# Patient Record
Sex: Female | Born: 1985 | Race: White | Hispanic: No | Smoking: Never smoker
Health system: Southern US, Community
[De-identification: ages and names within clinical notes are randomized; demographics above are authoritative.]

## PROBLEM LIST (undated history)

## (undated) ENCOUNTER — Inpatient Hospital Stay: Payer: Self-pay

## (undated) DIAGNOSIS — F909 Attention-deficit hyperactivity disorder, unspecified type: Secondary | ICD-10-CM

## (undated) DIAGNOSIS — N289 Disorder of kidney and ureter, unspecified: Secondary | ICD-10-CM

## (undated) DIAGNOSIS — Z9889 Other specified postprocedural states: Secondary | ICD-10-CM

## (undated) DIAGNOSIS — G43909 Migraine, unspecified, not intractable, without status migrainosus: Secondary | ICD-10-CM

## (undated) DIAGNOSIS — R112 Nausea with vomiting, unspecified: Secondary | ICD-10-CM

## (undated) DIAGNOSIS — R12 Heartburn: Secondary | ICD-10-CM

## (undated) DIAGNOSIS — F419 Anxiety disorder, unspecified: Secondary | ICD-10-CM

## (undated) HISTORY — PX: WISDOM TOOTH EXTRACTION: SHX21

## (undated) HISTORY — DX: Anxiety disorder, unspecified: F41.9

## (undated) HISTORY — DX: Heartburn: R12

## (undated) HISTORY — DX: Migraine, unspecified, not intractable, without status migrainosus: G43.909

## (undated) HISTORY — DX: Attention-deficit hyperactivity disorder, unspecified type: F90.9

---
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
## 2005-01-02 ENCOUNTER — Emergency Department (HOSPITAL_COMMUNITY): Admission: EM | Admit: 2005-01-02 | Discharge: 2005-01-02 | Payer: Self-pay | Admitting: Dietician

## 2005-03-08 ENCOUNTER — Emergency Department (HOSPITAL_COMMUNITY): Admission: EM | Admit: 2005-03-08 | Discharge: 2005-03-09 | Payer: Self-pay | Admitting: Emergency Medicine

## 2005-04-08 ENCOUNTER — Other Ambulatory Visit: Admission: RE | Admit: 2005-04-08 | Discharge: 2005-04-08 | Payer: Self-pay | Admitting: Obstetrics & Gynecology

## 2005-05-22 ENCOUNTER — Encounter: Admission: RE | Admit: 2005-05-22 | Discharge: 2005-06-12 | Payer: Self-pay | Admitting: Occupational Medicine

## 2005-05-27 ENCOUNTER — Emergency Department (HOSPITAL_COMMUNITY): Admission: EM | Admit: 2005-05-27 | Discharge: 2005-05-27 | Payer: Self-pay | Admitting: Family Medicine

## 2005-06-08 ENCOUNTER — Emergency Department (HOSPITAL_COMMUNITY): Admission: EM | Admit: 2005-06-08 | Discharge: 2005-06-08 | Payer: Self-pay | Admitting: Emergency Medicine

## 2005-06-09 ENCOUNTER — Encounter: Admission: RE | Admit: 2005-06-09 | Discharge: 2005-06-09 | Payer: Self-pay | Admitting: Occupational Medicine

## 2005-06-28 ENCOUNTER — Emergency Department (HOSPITAL_COMMUNITY): Admission: EM | Admit: 2005-06-28 | Discharge: 2005-06-28 | Payer: Self-pay | Admitting: Emergency Medicine

## 2005-08-09 ENCOUNTER — Emergency Department (HOSPITAL_COMMUNITY): Admission: EM | Admit: 2005-08-09 | Discharge: 2005-08-09 | Payer: Self-pay | Admitting: Emergency Medicine

## 2005-12-24 ENCOUNTER — Observation Stay (HOSPITAL_COMMUNITY): Admission: AD | Admit: 2005-12-24 | Discharge: 2005-12-26 | Payer: Self-pay | Admitting: Gynecology

## 2006-02-21 ENCOUNTER — Emergency Department (HOSPITAL_COMMUNITY): Admission: EM | Admit: 2006-02-21 | Discharge: 2006-02-21 | Payer: Self-pay | Admitting: Emergency Medicine

## 2006-03-24 ENCOUNTER — Encounter: Admission: RE | Admit: 2006-03-24 | Discharge: 2006-03-24 | Payer: Self-pay | Admitting: Gastroenterology

## 2007-03-23 ENCOUNTER — Emergency Department (HOSPITAL_COMMUNITY): Admission: EM | Admit: 2007-03-23 | Discharge: 2007-03-23 | Payer: Self-pay | Admitting: Emergency Medicine

## 2008-02-08 ENCOUNTER — Encounter: Admission: RE | Admit: 2008-02-08 | Discharge: 2008-02-08 | Payer: Self-pay | Admitting: Family Medicine

## 2010-01-03 ENCOUNTER — Emergency Department (HOSPITAL_COMMUNITY): Admission: EM | Admit: 2010-01-03 | Discharge: 2010-01-03 | Payer: Self-pay | Admitting: Emergency Medicine

## 2010-07-27 ENCOUNTER — Encounter: Payer: Self-pay | Admitting: Gastroenterology

## 2010-09-22 LAB — URINALYSIS, ROUTINE W REFLEX MICROSCOPIC
Bilirubin Urine: NEGATIVE
Glucose, UA: NEGATIVE mg/dL
Hgb urine dipstick: NEGATIVE
Nitrite: NEGATIVE
Protein, ur: NEGATIVE mg/dL
Specific Gravity, Urine: 1.023 (ref 1.005–1.030)
Urobilinogen, UA: 0.2 mg/dL (ref 0.0–1.0)
pH: 7 (ref 5.0–8.0)

## 2010-09-22 LAB — DIFFERENTIAL
Basophils Absolute: 0 10*3/uL (ref 0.0–0.1)
Basophils Relative: 0 % (ref 0–1)
Eosinophils Absolute: 0 10*3/uL (ref 0.0–0.7)
Eosinophils Relative: 0 % (ref 0–5)
Lymphocytes Relative: 8 % — ABNORMAL LOW (ref 12–46)
Lymphs Abs: 1.6 10*3/uL (ref 0.7–4.0)
Monocytes Absolute: 1.2 10*3/uL — ABNORMAL HIGH (ref 0.1–1.0)
Monocytes Relative: 6 % (ref 3–12)
Neutro Abs: 17.5 10*3/uL — ABNORMAL HIGH (ref 1.7–7.7)
Neutrophils Relative %: 86 % — ABNORMAL HIGH (ref 43–77)

## 2010-09-22 LAB — COMPREHENSIVE METABOLIC PANEL
ALT: 28 U/L (ref 0–35)
AST: 35 U/L (ref 0–37)
Albumin: 5 g/dL (ref 3.5–5.2)
Alkaline Phosphatase: 52 U/L (ref 39–117)
BUN: 14 mg/dL (ref 6–23)
CO2: 24 mEq/L (ref 19–32)
Calcium: 10 mg/dL (ref 8.4–10.5)
Chloride: 104 mEq/L (ref 96–112)
Creatinine, Ser: 0.82 mg/dL (ref 0.4–1.2)
GFR calc Af Amer: >60 mL/min (ref >60)
GFR calc non Af Amer: >60 mL/min (ref >60)
Glucose, Bld: 103 mg/dL — ABNORMAL HIGH (ref 70–99)
Potassium: 4.3 mEq/L (ref 3.5–5.1)
Sodium: 141 mEq/L (ref 135–145)
Total Bilirubin: 1.3 mg/dL — ABNORMAL HIGH (ref 0.3–1.2)
Total Protein: 9 g/dL — ABNORMAL HIGH (ref 6.0–8.3)

## 2010-09-22 LAB — WET PREP, GENITAL
Clue Cells Wet Prep HPF POC: NONE SEEN
Trich, Wet Prep: NONE SEEN
Yeast Wet Prep HPF POC: NONE SEEN

## 2010-09-22 LAB — CBC
HCT: 43.7 % (ref 36.0–46.0)
Hemoglobin: 14.7 g/dL (ref 12.0–15.0)
MCH: 28.3 pg (ref 26.0–34.0)
MCHC: 33.6 g/dL (ref 30.0–36.0)
MCV: 84.2 fL (ref 78.0–100.0)
Platelets: 217 10*3/uL (ref 150–400)
RBC: 5.19 MIL/uL — ABNORMAL HIGH (ref 3.87–5.11)
RDW: 13.4 % (ref 11.5–15.5)
WBC: 20.3 10*3/uL — ABNORMAL HIGH (ref 4.0–10.5)

## 2010-09-22 LAB — RPR: RPR Ser Ql: NONREACTIVE

## 2010-09-22 LAB — GC/CHLAMYDIA PROBE AMP, GENITAL
Chlamydia, DNA Probe: NEGATIVE
GC Probe Amp, Genital: NEGATIVE

## 2010-09-22 LAB — POCT PREGNANCY, URINE: Preg Test, Ur: NEGATIVE

## 2010-09-22 LAB — LIPASE, BLOOD: Lipase: 29 U/L (ref 11–59)

## 2010-12-13 ENCOUNTER — Inpatient Hospital Stay (INDEPENDENT_AMBULATORY_CARE_PROVIDER_SITE_OTHER)
Admission: RE | Admit: 2010-12-13 | Discharge: 2010-12-13 | Disposition: A | Discharge status: Home / Self Care | Payer: Worker's Compensation | Source: Ambulatory Visit | Attending: Emergency Medicine | Admitting: Emergency Medicine

## 2010-12-13 DIAGNOSIS — S335XXA Sprain of ligaments of lumbar spine, initial encounter: Secondary | ICD-10-CM

## 2011-05-26 LAB — HM PAP SMEAR: HM Pap smear: NORMAL

## 2011-09-23 ENCOUNTER — Encounter (HOSPITAL_BASED_OUTPATIENT_CLINIC_OR_DEPARTMENT_OTHER): Payer: Self-pay | Admitting: *Deleted

## 2011-09-23 ENCOUNTER — Emergency Department (HOSPITAL_BASED_OUTPATIENT_CLINIC_OR_DEPARTMENT_OTHER)
Admission: EM | Admit: 2011-09-23 | Discharge: 2011-09-23 | Disposition: A | Discharge status: Home / Self Care | Payer: Worker's Compensation | Attending: Emergency Medicine | Admitting: Emergency Medicine

## 2011-09-23 DIAGNOSIS — S39012A Strain of muscle, fascia and tendon of lower back, initial encounter: Secondary | ICD-10-CM

## 2011-09-23 DIAGNOSIS — S335XXA Sprain of ligaments of lumbar spine, initial encounter: Secondary | ICD-10-CM | POA: Insufficient documentation

## 2011-09-23 DIAGNOSIS — X500XXA Overexertion from strenuous movement or load, initial encounter: Secondary | ICD-10-CM | POA: Insufficient documentation

## 2011-09-23 DIAGNOSIS — S239XXA Sprain of unspecified parts of thorax, initial encounter: Secondary | ICD-10-CM | POA: Insufficient documentation

## 2011-09-23 LAB — URINALYSIS, ROUTINE W REFLEX MICROSCOPIC
Glucose, UA: NEGATIVE mg/dL
Hgb urine dipstick: NEGATIVE
Ketones, ur: 15 mg/dL — AB
Protein, ur: NEGATIVE mg/dL

## 2011-09-23 MED ORDER — IBUPROFEN 600 MG PO TABS: 600.0000 mg | ORAL_TABLET | Freq: Four times a day (QID) | ORAL | Status: AC | PRN

## 2011-09-23 MED ORDER — CYCLOBENZAPRINE HCL 10 MG PO TABS: 10.0000 mg | ORAL_TABLET | Freq: Every evening | ORAL | Status: AC | PRN

## 2011-09-23 MED ORDER — HYDROCODONE-ACETAMINOPHEN 5-325 MG PO TABS: 2.0000 | ORAL_TABLET | Freq: Four times a day (QID) | ORAL | Status: AC | PRN

## 2011-09-23 MED ORDER — IBUPROFEN 800 MG PO TABS
800.0000 mg | ORAL_TABLET | Freq: Once | ORAL | Status: AC
  Administered 2011-09-23: 800 mg via ORAL
  Filled 2011-09-23: qty 1

## 2011-09-23 NOTE — Discharge Instructions (Signed)
Back Pain, Adult Low back pain is very common. About 1 in 5 people have back pain.The cause of low back pain is rarely dangerous. The pain often gets better over time.About half of people with a sudden onset of back pain feel better in just 2 weeks. About 8 in 10 people feel better by 6 weeks.  CAUSES Some common causes of back pain include:  Strain of the muscles or ligaments supporting the spine.   Wear and tear (degeneration) of the spinal discs.   Arthritis.   Direct injury to the back.  DIAGNOSIS Most of the time, the direct cause of low back pain is not known.However, back pain can be treated effectively even when the exact cause of the pain is unknown.Answering your caregiver's questions about your overall health and symptoms is one of the most accurate ways to make sure the cause of your pain is not dangerous. If your caregiver needs more information, he or she may order lab work or imaging tests (X-rays or MRIs).However, even if imaging tests show changes in your back, this usually does not require surgery. HOME CARE INSTRUCTIONS For many people, back pain returns.Since low back pain is rarely dangerous, it is often a condition that people can learn to manageon their own.   Remain active. It is stressful on the back to sit or stand in one place. Do not sit, drive, or stand in one place for more than 30 minutes at a time. Take short walks on level surfaces as soon as pain allows.Try to increase the length of time you walk each day.   Do not stay in bed.Resting more than 1 or 2 days can delay your recovery.   Do not avoid exercise or work.Your body is made to move.It is not dangerous to be active, even though your back may hurt.Your back will likely heal faster if you return to being active before your pain is gone.   Pay attention to your body when you bend and lift. Many people have less discomfortwhen lifting if they bend their knees, keep the load close to their  bodies,and avoid twisting. Often, the most comfortable positions are those that put less stress on your recovering back.   Find a comfortable position to sleep. Use a firm mattress and lie on your side with your knees slightly bent. If you lie on your back, put a pillow under your knees.   Only take over-the-counter or prescription medicines as directed by your caregiver. Over-the-counter medicines to reduce pain and inflammation are often the most helpful.Your caregiver may prescribe muscle relaxant drugs.These medicines help dull your pain so you can more quickly return to your normal activities and healthy exercise.   Put ice on the injured area.   Put ice in a plastic bag.   Place a towel between your skin and the bag.   Leave the ice on for 15 to 20 minutes, 3 to 4 times a day for the first 2 to 3 days. After that, ice and heat may be alternated to reduce pain and spasms.   Ask your caregiver about trying back exercises and gentle massage. This may be of some benefit.   Avoid feeling anxious or stressed.Stress increases muscle tension and can worsen back pain.It is important to recognize when you are anxious or stressed and learn ways to manage it.Exercise is a great option.  SEEK MEDICAL CARE IF:  You have pain that is not relieved with rest or medicine.   You have   pain that does not improve in 1 week.   You have new symptoms.   You are generally not feeling well.  SEEK IMMEDIATE MEDICAL CARE IF:   You have pain that radiates from your back into your legs.   You develop new bowel or bladder control problems.   You have unusual weakness or numbness in your arms or legs.   You develop nausea or vomiting.   You develop abdominal pain.   You feel faint.  Document Released: 06/23/2005 Document Revised: 06/12/2011 Document Reviewed: 11/11/2010 [facility] Patient Information 2012 [place], [place].  Pain Medicine Instructions You have been given a prescription for pain  medicines. These medicines may affect your ability to think clearly. They may also affect your ability to perform physical activities. Take these medicines only as needed for pain. You do not need to take them if you are not having pain, unless directed by your caregiver. You can take less than the prescribed dose if you find a smaller amount of medicine controls the pain. It may not be possible to make all of your pain go away, but you should be comfortable enough to move, breathe, and take care of yourself. After you start taking pain medicines, while taking the medicines, and for 8 hours after stopping the medicines:  Do not drive.   Do not operate machinery.   Do not operate power tools.   Do not sign legal documents.   Do not supervise children by yourself.   Do not participate in activities that require climbing or being in high places.   Do not enter a body of water (lake, river, ocean, spa, swimming pool) without an adult nearby who can help you.  You may have been prescribed a pain medicine that contains acetaminophen (paracetamol). If so, take only the amount directed by your caregiver. Do not take any other acetaminophen while taking this medicine. An overdose of acetaminophen can result in severe liver damage. If you are taking other medicines, check the active ingredients for acetaminophen. Acetaminophen is found in hundreds of over-the-counter and prescription medicines. These include cold relief products, menstrual cramp relief medicines, fever-reducing medicines, acid indigestion relief products, and pain relief products. HOME CARE INSTRUCTIONS   Do not drink alcohol, take sleeping pills, or take other medicines until at least 8 hours after your last dose of pain medicine, or as directed by your caregiver.   Use a bulk stool softener if you become constipated from your pain medicines. Increasing your intake of fruits and vegetables will also help.   Write down the times when  you take your medicines. Look at the times before taking your next dose of medicine. It is easy to become confused while on pain medicines. Recording the times helps you to avoid an overdose.  SEEK MEDICAL CARE IF:  Your medicine is not helping the pain go away.   You vomit or have diarrhea shortly after taking the medicine.   You develop new pain in areas that did not hurt before.  SEEK IMMEDIATE MEDICAL CARE IF:  You feel dizzy or faint.   You feel there are other problems that might be caused by your medicine.  MAKE SURE YOU:   Understand these instructions.   Will watch your condition.   Will get help right away if you are not doing well or get worse.  Document Released: 09/29/2000 Document Revised: 06/12/2011 Document Reviewed: 06/07/2010 [facility] Patient Information 2012 [place], [place].

## 2011-09-23 NOTE — ED Provider Notes (Addendum)
History     CSN: [id number]  Arrival date & time 09/23/11  1946   First MD Initiated Contact with Patient 09/23/11 2131      Chief Complaint  Patient presents with  . 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 Denies numbness/tingling/weakness extremities. Pain 8/10 at this time. Took ibuprofen without relief. Denies history of recent trauma/falls. Denies h/o malignancy, DM, immunocompromise  injection drug use, immunosuppression, indwelling urinary catheter, prolonged steroid use, skin or urinary tract infection. No numbness/tingling/weakness of extremities. Denies fever/chills. Denies saddle anesthesia, no urinary incontinence or retention. Denies hematuria/dysuria/freq/urgency   ED Notes, ED Provider Notes from 09/23/11 0000 to 09/23/11 20:10:29       Merian Capron, RN 09/23/2011 20:09      Pt was trying to get a patient out of the bathtub onto the stretcher, c/o pain in lower back that "goes up" her spine area Pt took ibuprofen at 1330 (Pt works for [employer] and will call her supervisor to see if she needs a UDS)    History reviewed. No pertinent past medical history.  History reviewed. No pertinent past surgical history.  No family history on file.  History  Substance Use Topics  . Smoking status: Never Smoker   . Smokeless tobacco: Not on file  . Alcohol Use: Yes    OB History    Grav Para Term Preterm Abortions TAB SAB Ect Mult Living                  Review of Systems  All other systems reviewed and are negative.  except as noted HPI  Allergies  Review of patient's allergies indicates no known allergies.  Home Medications   Current Outpatient Rx  Name Route Sig Dispense Refill  . IBUPROFEN 200 MG PO TABS Oral Take 800 mg by mouth once as  needed. For back pain    . LISDEXAMFETAMINE DIMESYLATE 40 MG PO CAPS Oral Take 40 mg by mouth every morning.    Marland Kitchen MEDROXYPROGESTERONE ACETATE 150 MG/ML IM SUSP Intramuscular Inject 150 mg into the muscle every 3 (three) months.    . CYCLOBENZAPRINE HCL 10 MG PO TABS Oral Take 1 tablet (10 mg total) by mouth at bedtime as needed for muscle spasms. 20 tablet 0  . HYDROCODONE-ACETAMINOPHEN 5-325 MG PO TABS Oral Take 2 tablets by mouth every 6 (six) hours as needed for pain. 10 tablet 0  . IBUPROFEN 600 MG PO TABS Oral Take 1 tablet (600 mg total) by mouth every 6 (six) hours as needed for pain. 30 tablet 0    BP 125/89  Pulse 89  Temp(Src) 98.1 F (36.7 C) (Oral)  Resp 20  SpO2 99%  Physical Exam  Nursing note and vitals reviewed. Constitutional: She is oriented to person, place, and time. She appears well-developed.  HENT:  Head: Atraumatic.  Mouth/Throat: Oropharynx is clear and moist.  Eyes: Conjunctivae and EOM are normal. Pupils are equal, round, and reactive to light.  Neck: Normal range of motion. Neck supple.  Cardiovascular: Normal rate, regular rhythm, normal heart sounds and intact distal pulses.   Pulmonary/Chest: Effort normal and breath sounds normal. No respiratory distress. She has no wheezes. She has no rales.  Abdominal: Soft. She exhibits no distension. There is no tenderness. There is no rebound and no guarding.  Musculoskeletal: Normal range of  motion.       +ttp midline thoracic +midline lumbar, Lt lumbar ttp  Neurological: She is alert and oriented to person, place, and time.       Strength 5/5 b/l LE Gross sensation intact No saddle anesthesia  Skin: Skin is warm and dry. No rash noted.  Psychiatric: She has a normal mood and affect.    ED Course  Procedures (including critical care time)  Labs Reviewed  URINALYSIS, ROUTINE W REFLEX MICROSCOPIC - Abnormal; Notable for the following:    Ketones, ur 15 (*)    All other components within normal limits    PREGNANCY, URINE  URINALYSIS, WITH MICROSCOPIC   No results found.   1. Back strain     MDM  Back strain after heavy lifting. Driving today. Ibuprofen. Home with norco, flexeril QHS, ibuprofen. No heavy lifting. Precautions for return. Aware no working/driving under influence of narcotic pain medication, flexeril.        Forbes Cellar, MD 09/23/11 1610  Forbes Cellar, MD 09/23/11 2211

## 2011-09-23 NOTE — ED Notes (Signed)
Pt was trying to get a patient out of the bathtub onto the stretcher, c/o pain in lower back that "goes up" her spine area Pt took ibuprofen at 1330 (Pt works for [employer] and will call her supervisor to see if she needs a UDS)

## 2011-10-02 ENCOUNTER — Telehealth: Payer: Self-pay

## 2011-10-02 NOTE — Telephone Encounter (Signed)
PT CALLED AND IS TAKING VICODAN + FLEXERIL FOR PAIN MGMT FOR W/C BACK INJURY.  PT WANTS TO KNOW IF THERE IS ANYTHING ELSE SHE CAN TAKE.  SHE IS CONTINUING TO HAVE PAIN.

## 2011-10-03 NOTE — Telephone Encounter (Signed)
Seen last by Dr. Perrin Maltese on 3/28... Can we recommend anything else?

## 2011-10-03 NOTE — Telephone Encounter (Signed)
Sh can bring in the Vicodin and exchange for Percocet rx.  Kathryn Lozano

## 2011-10-04 ENCOUNTER — Other Ambulatory Visit: Payer: Self-pay

## 2011-10-04 NOTE — Telephone Encounter (Signed)
Spoke with pt advised message from [place]. If pt decides to change RX she will bring the Vicodin back.

## 2012-10-09 ENCOUNTER — Encounter: Payer: Self-pay | Admitting: Family Medicine

## 2012-10-09 DIAGNOSIS — G43909 Migraine, unspecified, not intractable, without status migrainosus: Secondary | ICD-10-CM | POA: Insufficient documentation

## 2012-10-09 DIAGNOSIS — F909 Attention-deficit hyperactivity disorder, unspecified type: Secondary | ICD-10-CM | POA: Insufficient documentation

## 2012-10-14 ENCOUNTER — Ambulatory Visit (INDEPENDENT_AMBULATORY_CARE_PROVIDER_SITE_OTHER): Payer: 59 | Admitting: Family Medicine

## 2012-10-14 ENCOUNTER — Encounter: Payer: Self-pay | Admitting: Family Medicine

## 2012-10-14 VITALS — BP 118/74 | HR 88 | Temp 98.2°F | Resp 16 | Ht 62.0 in | Wt 170.0 lb

## 2012-10-14 DIAGNOSIS — F419 Anxiety disorder, unspecified: Secondary | ICD-10-CM | POA: Insufficient documentation

## 2012-10-14 DIAGNOSIS — Z124 Encounter for screening for malignant neoplasm of cervix: Secondary | ICD-10-CM

## 2012-10-14 DIAGNOSIS — Z0000 Encounter for general adult medical examination without abnormal findings: Secondary | ICD-10-CM

## 2012-10-14 DIAGNOSIS — R002 Palpitations: Secondary | ICD-10-CM

## 2012-10-14 LAB — CBC WITH DIFFERENTIAL/PLATELET
Basophils Absolute: 0.1 10*3/uL (ref 0.0–0.1)
Eosinophils Relative: 2 % (ref 0–5)
HCT: 42.5 % (ref 36.0–46.0)
Lymphocytes Relative: 35 % (ref 12–46)
MCHC: 33.4 g/dL (ref 30.0–36.0)
MCV: 82.2 fL (ref 78.0–100.0)
Monocytes Absolute: 0.7 10*3/uL (ref 0.1–1.0)
RDW: 13.5 % (ref 11.5–15.5)
WBC: 7.6 10*3/uL (ref 4.0–10.5)

## 2012-10-14 LAB — HEPATIC FUNCTION PANEL
ALT: 16 U/L (ref 0–35)
AST: 13 U/L (ref 0–37)
Alkaline Phosphatase: 47 U/L (ref 39–117)
Bilirubin, Direct: 0.1 mg/dL (ref 0.0–0.3)
Indirect Bilirubin: 0.4 mg/dL (ref 0.0–0.9)

## 2012-10-14 LAB — BASIC METABOLIC PANEL
Chloride: 102 mEq/L (ref 96–112)
Potassium: 4.6 mEq/L (ref 3.5–5.3)
Sodium: 140 mEq/L (ref 135–145)

## 2012-10-14 MED ORDER — MEDROXYPROGESTERONE ACETATE 150 MG/ML IM SUSP: 150.0000 mg | INTRAMUSCULAR | Status: DC

## 2012-10-14 NOTE — Progress Notes (Signed)
Subjective:    Patient ID: Kathryn Lozano, female    DOB: 15-Mar-1986, 27 y.o.   MRN: [id number]  HPI  Patient is here today for complete physical exam and Pap smear.  She reports episodes of high resting heart rate between 101 30. She states this occurs on days that she takes her stimulant medication for ADD.  She states it does not happen when she skips the medication. She denies any recent weight change, hot flashes, depression, recurrent loss.  Otherwise she is doing well.  Past Medical History  Diagnosis Date  . Migraines   . ADHD (attention deficit hyperactivity disorder)   . Anxiety    Current Outpatient Prescriptions on File Prior to Visit  Medication Sig Dispense Refill  . ALPRAZolam (XANAX) 0.5 MG tablet Take 0.5 mg by mouth every 8 (eight) hours as needed for sleep.      Marland Kitchen ibuprofen (ADVIL,MOTRIN) 200 MG tablet Take 800 mg by mouth once as needed. For back pain      . lisdexamfetamine (VYVANSE) 40 MG capsule Take 40 mg by mouth every morning.       No current facility-administered medications on file prior to visit.   No Known Allergies History   Social History  . Marital Status: Single    Spouse Name: N/A    Number of Children: N/A  . Years of Education: N/A   Occupational History  . Not on file.   Social History Main Topics  . Smoking status: Never Smoker   . Smokeless tobacco: Not on file  . Alcohol Use: Yes  . Drug Use: No  . Sexually Active: Not on file   Other Topics Concern  . Not on file   Social History Narrative  . No narrative on file   Family history Dad is a smoker Mom's history is unknown Patient's sister ran away from home.  Review of Systems  Constitutional: Negative.   HENT: Negative.   Eyes: Negative.   Respiratory: Negative.   Cardiovascular: Negative.   Gastrointestinal: Negative.   Endocrine: Negative.   Genitourinary: Negative.   Allergic/Immunologic: Negative.   Neurological: Negative.   Hematological: Negative.    Psychiatric/Behavioral: Negative.        Objective:   Physical Exam  Constitutional: She is oriented to person, place, and time. She appears well-developed and well-nourished.  HENT:  Head: Normocephalic and atraumatic.  Right Ear: External ear normal.  Left Ear: External ear normal.  Nose: Nose normal.  Mouth/Throat: Oropharynx is clear and moist.  Eyes: Conjunctivae and EOM are normal. Pupils are equal, round, and reactive to light.  Neck: Normal range of motion. Neck supple. No JVD present. No tracheal deviation present. No thyromegaly present.  Cardiovascular: Normal rate, regular rhythm, normal heart sounds and intact distal pulses.  Exam reveals no gallop and no friction rub.   No murmur heard. Pulmonary/Chest: Effort normal and breath sounds normal. No respiratory distress. She has no wheezes. She has no rales. She exhibits no tenderness.  Abdominal: Soft. Bowel sounds are normal. She exhibits no distension and no mass. There is no tenderness. There is no rebound and no guarding.  Genitourinary: Vagina normal and uterus normal. No vaginal discharge found.  Musculoskeletal: Normal range of motion. She exhibits no edema and no tenderness.  Lymphadenopathy:    She has no cervical adenopathy.  Neurological: She is alert and oriented to person, place, and time. She has normal reflexes. She displays normal reflexes. No cranial nerve deficit. She exhibits normal muscle  tone. Coordination normal.  Skin: Skin is warm and dry. No rash noted. No erythema. No pallor.  Psychiatric: She has a normal mood and affect. Her behavior is normal. Judgment and thought content normal.   breast exam is normal. Patient does have a 3 mm dark brown lateral at approximately 4:00. She states this is chronic and unchanged. We elect to monitor this for now.        Assessment & Plan:  1. Cervical cancer screening We discussed increasing her screening interval to every 3 years as long as paps were  normal. - Pap IG (Image Guided)  2. Palpitations Patient is to discontinue her stamina medication for 2 weeks. She will call me at that point. If palpitations persist we may want to have her scheduled to have a one-month event monitor. Palpitations stop, we will consider Strattera. - TSH  3. Routine general medical examination at a health care facility Otherwise normal exam.  Followup in 1 year. Medications are refilled. - Basic Metabolic Panel - Hepatic Function Panel - CBC with Differential

## 2012-10-15 LAB — PAP IG (IMAGE GUIDED)

## 2012-10-19 ENCOUNTER — Telehealth: Payer: Self-pay | Admitting: Family Medicine

## 2012-10-19 MED ORDER — METOPROLOL SUCCINATE ER 25 MG PO TB24: 25.0000 mg | ORAL_TABLET | ORAL | Status: DC

## 2012-10-19 NOTE — Telephone Encounter (Signed)
Pt aware and acript called in

## 2012-10-19 NOTE — Telephone Encounter (Signed)
Yes but start toprol xl 25 mg poqday for tachycardia.

## 2012-11-01 ENCOUNTER — Telehealth: Payer: Self-pay | Admitting: Family Medicine

## 2012-11-01 NOTE — Telephone Encounter (Signed)
flonase 2 sprays B Qday and tessalon perles 200 q 8 hrs prn (30)

## 2012-11-02 MED ORDER — FLUTICASONE PROPIONATE 50 MCG/ACT NA SUSP: 2.0000 | Freq: Every day | NASAL | Status: DC

## 2012-11-02 MED ORDER — BENZONATATE 200 MG PO CAPS: 200.0000 mg | ORAL_CAPSULE | Freq: Three times a day (TID) | ORAL | Status: DC

## 2012-11-02 NOTE — Telephone Encounter (Signed)
rx sent to pharmacy and pt aware. 

## 2012-11-22 ENCOUNTER — Telehealth: Payer: Self-pay | Admitting: Family Medicine

## 2012-11-23 MED ORDER — LISDEXAMFETAMINE DIMESYLATE 40 MG PO CAPS: 40.0000 mg | ORAL_CAPSULE | ORAL | Status: DC

## 2012-11-23 NOTE — Telephone Encounter (Signed)
RX printed for provider signature 

## 2012-11-25 NOTE — Telephone Encounter (Signed)
Pt picked up.

## 2012-12-28 ENCOUNTER — Ambulatory Visit (INDEPENDENT_AMBULATORY_CARE_PROVIDER_SITE_OTHER): Payer: 59 | Admitting: Family Medicine

## 2012-12-28 ENCOUNTER — Ambulatory Visit
Admission: RE | Admit: 2012-12-28 | Discharge: 2012-12-28 | Disposition: A | Discharge status: Home / Self Care | Payer: 59 | Source: Ambulatory Visit | Attending: Family Medicine | Admitting: Family Medicine

## 2012-12-28 ENCOUNTER — Encounter: Payer: Self-pay | Admitting: Family Medicine

## 2012-12-28 VITALS — BP 112/70 | HR 68 | Temp 97.9°F | Resp 18 | Ht 63.0 in | Wt 172.0 lb

## 2012-12-28 DIAGNOSIS — R05 Cough: Secondary | ICD-10-CM | POA: Insufficient documentation

## 2012-12-28 DIAGNOSIS — R059 Cough, unspecified: Secondary | ICD-10-CM

## 2012-12-28 DIAGNOSIS — F909 Attention-deficit hyperactivity disorder, unspecified type: Secondary | ICD-10-CM

## 2012-12-28 DIAGNOSIS — R0982 Postnasal drip: Secondary | ICD-10-CM

## 2012-12-28 DIAGNOSIS — R053 Chronic cough: Secondary | ICD-10-CM | POA: Insufficient documentation

## 2012-12-28 MED ORDER — LISDEXAMFETAMINE DIMESYLATE 40 MG PO CAPS: 40.0000 mg | ORAL_CAPSULE | ORAL | Status: DC

## 2012-12-28 MED ORDER — AZITHROMYCIN 250 MG PO TABS: ORAL_TABLET | ORAL | Status: DC

## 2012-12-28 MED ORDER — MOMETASONE FUROATE 50 MCG/ACT NA SUSP: 2.0000 | Freq: Every day | NASAL | Status: DC

## 2012-12-28 NOTE — Assessment & Plan Note (Signed)
Trial of nasocort, if no improvement referral to allergy vs ENT

## 2012-12-28 NOTE — Assessment & Plan Note (Signed)
Medications refilled, doing well, uses for work only

## 2012-12-28 NOTE — Assessment & Plan Note (Addendum)
CXR negative, no signs of pneumonia, I think this is from the post nasal drip Given zpak due to sinus pressure  Other differentials- allergies, vs GERD, vs atypical cough variant asthma Pt will f/u via phone for referral if she desires  She declined trial of prednisone or other anti-histamine has tried multiple

## 2012-12-28 NOTE — Patient Instructions (Addendum)
Humidifier at night Take antibiotics Get the chest xray  Call as needed

## 2012-12-28 NOTE — Progress Notes (Signed)
  Subjective:    Patient ID: Kathryn Lozano, female    DOB: 1986/05/05, 27 y.o.   MRN: [id number]  HPI  Patient here with cough for the past 4 months. She states it started after severe postnasal drip she feels a running down her throat all the time. She's tried over-the-counter antihistamines including a combination of Benadryl and Tessalon as well as see her tact and Allegra which have not helped. She also to cough medications and Mucinex which did not help. She uses Flonase for some time this also did not help. Her cough sometimes have production of clear sputum but is mostly feels like an irritation. She occasionally gets heartburn. She also notes that her nose runs more after she eats she then tried seen with dairy products but denies to change with her drainage. She works as EMT  Needs refill on vyvanse, uses for work only, no SE with medications  Review of Systems  GEN- denies fatigue, fever, weight loss,weakness, recent illness HEENT- denies eye drainage, change in vision, nasal discharge, CVS- denies chest pain, palpitations RESP- denies SOB, cough, wheeze ABD- denies N/V, change in stools, abd pain GU- denies dysuria, hematuria, dribbling, incontinence MSK- denies joint pain, muscle aches, injury Neuro- denies headache, dizziness, syncope, seizure activity      Objective:   Physical Exam GEN- NAD, alert and oriented x3 HEENT- PERRL, EOMI, non injected sclera, pink conjunctiva, MMM, oropharynx mild injection, TM clear bilat no effusion, no maxillary sinus tenderness, inflammed turbinates,  +Nasal drainage  Neck- Supple, no LAD CVS- RRR, no murmur RESP-CTAB EXT- No edema Pulses- Radial 2+  CXR- Normal        Assessment & Plan:

## 2013-04-08 ENCOUNTER — Ambulatory Visit (INDEPENDENT_AMBULATORY_CARE_PROVIDER_SITE_OTHER): Payer: 59 | Admitting: Family Medicine

## 2013-04-08 ENCOUNTER — Encounter: Payer: Self-pay | Admitting: Family Medicine

## 2013-04-08 VITALS — BP 108/80 | HR 84 | Temp 98.0°F | Resp 18 | Ht 63.0 in | Wt 174.0 lb

## 2013-04-08 DIAGNOSIS — R202 Paresthesia of skin: Secondary | ICD-10-CM

## 2013-04-08 DIAGNOSIS — R209 Unspecified disturbances of skin sensation: Secondary | ICD-10-CM

## 2013-04-08 NOTE — Progress Notes (Signed)
Subjective:    Patient ID: Kathryn Lozano, female    DOB: August 25, 1985, 27 y.o.   MRN: [id number]  HPI Over the last month the patient has developed paresthesias and dysesthesias primarily in her left arm occasionally in her left leg. Her left leg usually occurs with prolonged sitting and sounds like it could be a nervee compression due to sitting. However her left arm tends to node inciting event. After "shaking" her arm for 10-20 minutes the symptoms will go away.  She also notices decreased his grip strength during these events. It is not occurring to her right arm only her left arm. She has been having some neck pain recently. She denies any numbness and tingling at present. She is right-hand dominant. There are no to specific positions that seem to trigger her symptoms. Past Medical History  Diagnosis Date  . Migraines   . ADHD (attention deficit hyperactivity disorder)   . Anxiety    Current Outpatient Prescriptions on File Prior to Visit  Medication Sig Dispense Refill  . ALPRAZolam (XANAX) 0.5 MG tablet Take 0.5 mg by mouth every 8 (eight) hours as needed for sleep.      Marland Kitchen ibuprofen (ADVIL,MOTRIN) 200 MG tablet Take 800 mg by mouth once as needed. For back pain      . lisdexamfetamine (VYVANSE) 40 MG capsule Take 1 capsule (40 mg total) by mouth every morning.  30 capsule  0  . medroxyPROGESTERone (DEPO-PROVERA) 150 MG/ML injection Inject 1 mL (150 mg total) into the muscle every 3 (three) months.  1 mL  3  . azithromycin (ZITHROMAX) 250 MG tablet Take 2 tablets x 1 day, then 1 tablet daily for 4 days  6 tablet  0  . mometasone (NASONEX) 50 MCG/ACT nasal spray Place 2 sprays into the nose daily.  17 g  3   No current facility-administered medications on file prior to visit.   No Known Allergies History   Social History  . Marital Status: Single    Spouse Name: N/A    Number of Children: N/A  . Years of Education: N/A   Occupational History  . Not on file.   Social History  Main Topics  . Smoking status: Never Smoker   . Smokeless tobacco: Not on file  . Alcohol Use: Yes  . Drug Use: No  . Sexual Activity: Not on file   Other Topics Concern  . Not on file   Social History Narrative  . No narrative on file      Review of Systems  All other systems reviewed and are negative.       Objective:   Physical Exam  Vitals reviewed. Constitutional: She is oriented to person, place, and time.  Cardiovascular: Normal rate, regular rhythm and normal heart sounds.   Pulmonary/Chest: Effort normal and breath sounds normal.  Musculoskeletal:       Left shoulder: Normal.       Left elbow: Normal.       Left wrist: Normal.  Neurological: She is alert and oriented to person, place, and time. She displays normal reflexes. No cranial nerve deficit. She exhibits normal muscle tone. Coordination normal.   she has a positive Tinel sign, a negative Phalen sign, and equivocal Spurling's maneuver. There is some pain that shoots into her shoulder but not into her hand.        Assessment & Plan:  1. Paresthesia of arm I believe she most likely has carpal tunnel syndrome. However I cannot rule  out a cervical radiculopathy. I will begin by obtaining nerve conduction studies of the upper extremities. If these are normal I would proceed with an MRI of the cervical spine. Further plan will be determined based on findings of nerve conduction test. - Nerve conduction test; Future

## 2013-05-13 ENCOUNTER — Encounter (INDEPENDENT_AMBULATORY_CARE_PROVIDER_SITE_OTHER): Payer: Self-pay

## 2013-05-13 ENCOUNTER — Ambulatory Visit (INDEPENDENT_AMBULATORY_CARE_PROVIDER_SITE_OTHER): Payer: 59 | Admitting: Neurology

## 2013-05-13 DIAGNOSIS — R202 Paresthesia of skin: Secondary | ICD-10-CM

## 2013-05-13 DIAGNOSIS — R209 Unspecified disturbances of skin sensation: Secondary | ICD-10-CM

## 2013-05-13 DIAGNOSIS — Z0289 Encounter for other administrative examinations: Secondary | ICD-10-CM

## 2013-05-13 NOTE — Procedures (Signed)
  HISTORY:  Kathryn Lozano is a 27 year old patient with a one-year history of intermittent numbness and tingling sensations involving the left arm and left leg that comes on with sitting, goes away with standing. The patient denies any significant neck discomfort or pain down the extremities. The patient is being evaluated for a possible neuropathy or a radiculopathy.  NERVE CONDUCTION STUDIES:  Nerve conduction studies were performed on both upper extremities. The distal motor latencies and motor amplitudes for the median and ulnar nerves were within normal limits. The F wave latencies and nerve conduction velocities for these nerves were also normal. The sensory latencies for the median and ulnar nerves were normal.   EMG STUDIES:  EMG study was performed on the left upper extremity:  The first dorsal interosseous muscle reveals 2 to 4 K units with full recruitment. No fibrillations or positive waves were noted. The abductor pollicis brevis muscle reveals 2 to 4 K units with full recruitment. No fibrillations or positive waves were noted. The extensor indicis proprius muscle reveals 1 to 3 K units with full recruitment. No fibrillations or positive waves were noted. The pronator teres muscle reveals 2 to 3 K units with full recruitment. No fibrillations or positive waves were noted. The biceps muscle reveals 1 to 2 K units with full recruitment. No fibrillations or positive waves were noted. The triceps muscle reveals 2 to 4 K units with full recruitment. No fibrillations or positive waves were noted. The anterior deltoid muscle reveals 2 to 3 K units with full recruitment. No fibrillations or positive waves were noted. The cervical paraspinal muscles were tested at 2 levels. No abnormalities of insertional activity were seen at either level tested. There was good relaxation.   IMPRESSION:  Nerve conduction studies done on both upper extremities were within normal limits. There is no  evidence of a neuropathy. EMG evaluation of the left upper extremity was normal, without evidence of a cervical radiculopathy.  Marlan Palau MD 05/13/2013 10:57 AM  [facility] [address] [place], [place] [address]  Phone [phone] Fax [phone]

## 2013-05-26 ENCOUNTER — Telehealth: Payer: Self-pay | Admitting: Family Medicine

## 2013-05-26 MED ORDER — LISDEXAMFETAMINE DIMESYLATE 40 MG PO CAPS: 40.0000 mg | ORAL_CAPSULE | ORAL | Status: DC

## 2013-05-26 NOTE — Telephone Encounter (Signed)
RX printed, left up front and patient aware to pick up  

## 2013-05-26 NOTE — Telephone Encounter (Signed)
Patient needs her Vyvance refilled X 3 mths supply . Patient is completely out of meds. Would like to pick up tomorrow.

## 2013-09-09 ENCOUNTER — Telehealth: Payer: Self-pay | Admitting: Family Medicine

## 2013-09-09 NOTE — Telephone Encounter (Signed)
?   OK to Refill for 3 months 

## 2013-09-09 NOTE — Telephone Encounter (Signed)
ok 

## 2013-09-09 NOTE — Telephone Encounter (Signed)
Call back number is [phone] Pt is needing a 3 month supply of her lisdexamfetamine (VYVANSE) 40 MG capsule And she is needing a refill on her medroxyPROGESTERone (DEPO-PROVERA) 150 MG/ML injection

## 2013-09-12 MED ORDER — LISDEXAMFETAMINE DIMESYLATE 40 MG PO CAPS: 40.0000 mg | ORAL_CAPSULE | ORAL | Status: DC

## 2013-09-12 MED ORDER — MEDROXYPROGESTERONE ACETATE 150 MG/ML IM SUSP: 150.0000 mg | INTRAMUSCULAR | Status: DC

## 2013-09-12 NOTE — Telephone Encounter (Signed)
RX printed, left up front and patient aware to pick up  

## 2014-01-12 ENCOUNTER — Telehealth: Payer: Self-pay | Admitting: Family Medicine

## 2014-01-12 NOTE — Telephone Encounter (Signed)
Ok with 1 rf until ov, ntbs

## 2014-01-12 NOTE — Telephone Encounter (Signed)
?   OK to Refill - LOV 04-08-2013

## 2014-01-12 NOTE — Telephone Encounter (Signed)
[phone]   Pt is needing a refill on lisdexamfetamine (VYVANSE) 40 MG capsule

## 2014-01-13 MED ORDER — LISDEXAMFETAMINE DIMESYLATE 40 MG PO CAPS: 40.0000 mg | ORAL_CAPSULE | ORAL | Status: DC

## 2014-01-13 NOTE — Telephone Encounter (Signed)
RX printed, left up front and patient aware to pick up and to schedule appt via vm

## 2014-02-03 ENCOUNTER — Other Ambulatory Visit: Payer: 59

## 2014-02-03 ENCOUNTER — Encounter: Payer: Self-pay | Admitting: Family Medicine

## 2014-02-03 ENCOUNTER — Ambulatory Visit (INDEPENDENT_AMBULATORY_CARE_PROVIDER_SITE_OTHER): Payer: 59 | Admitting: Family Medicine

## 2014-02-03 VITALS — BP 126/74 | HR 82 | Temp 97.2°F | Resp 18 | Ht 63.0 in | Wt 173.0 lb

## 2014-02-03 DIAGNOSIS — Z30430 Encounter for insertion of intrauterine contraceptive device: Secondary | ICD-10-CM

## 2014-02-03 DIAGNOSIS — F988 Other specified behavioral and emotional disorders with onset usually occurring in childhood and adolescence: Secondary | ICD-10-CM

## 2014-02-03 DIAGNOSIS — Z0000 Encounter for general adult medical examination without abnormal findings: Secondary | ICD-10-CM

## 2014-02-03 LAB — COMPREHENSIVE METABOLIC PANEL
ALBUMIN: 4.4 g/dL (ref 3.5–5.2)
ALT: 20 U/L (ref 0–35)
AST: 16 U/L (ref 0–37)
Alkaline Phosphatase: 42 U/L (ref 39–117)
BUN: 10 mg/dL (ref 6–23)
CALCIUM: 9.7 mg/dL (ref 8.4–10.5)
CHLORIDE: 104 meq/L (ref 96–112)
CO2: 25 meq/L (ref 19–32)
Creat: 0.83 mg/dL (ref 0.50–1.10)
Glucose, Bld: 81 mg/dL (ref 70–99)
POTASSIUM: 4.5 meq/L (ref 3.5–5.3)
SODIUM: 138 meq/L (ref 135–145)
TOTAL PROTEIN: 7.1 g/dL (ref 6.0–8.3)
Total Bilirubin: 0.9 mg/dL (ref 0.2–1.2)

## 2014-02-03 LAB — CBC WITH DIFFERENTIAL/PLATELET
BASOS ABS: 0.1 10*3/uL (ref 0.0–0.1)
Basophils Relative: 1 % (ref 0–1)
Eosinophils Absolute: 0.1 10*3/uL (ref 0.0–0.7)
Eosinophils Relative: 2 % (ref 0–5)
HEMATOCRIT: 41.8 % (ref 36.0–46.0)
Hemoglobin: 14.1 g/dL (ref 12.0–15.0)
LYMPHS ABS: 2.2 10*3/uL (ref 0.7–4.0)
LYMPHS PCT: 43 % (ref 12–46)
MCH: 27.4 pg (ref 26.0–34.0)
MCHC: 33.7 g/dL (ref 30.0–36.0)
MCV: 81.3 fL (ref 78.0–100.0)
Monocytes Absolute: 0.5 10*3/uL (ref 0.1–1.0)
Monocytes Relative: 9 % (ref 3–12)
NEUTROS ABS: 2.3 10*3/uL (ref 1.7–7.7)
Neutrophils Relative %: 45 % (ref 43–77)
PLATELETS: 231 10*3/uL (ref 150–400)
RBC: 5.14 MIL/uL — AB (ref 3.87–5.11)
RDW: 13.5 % (ref 11.5–15.5)
WBC: 5 10*3/uL (ref 4.0–10.5)

## 2014-02-03 LAB — LIPID PANEL
CHOL/HDL RATIO: 3.6 ratio
CHOLESTEROL: 204 mg/dL — AB (ref 0–200)
HDL: 57 mg/dL (ref >39)
LDL Cholesterol: 135 mg/dL — ABNORMAL HIGH (ref 0–99)
Triglycerides: 62 mg/dL (ref <150)
VLDL: 12 mg/dL (ref 0–40)

## 2014-02-03 LAB — TSH: TSH: 2.346 u[IU]/mL (ref 0.350–4.500)

## 2014-02-03 MED ORDER — SUMATRIPTAN SUCCINATE 50 MG PO TABS: 50.0000 mg | ORAL_TABLET | Freq: Once | ORAL | Status: DC

## 2014-02-03 MED ORDER — AMPHETAMINE-DEXTROAMPHET ER 15 MG PO CP24: 15.0000 mg | ORAL_CAPSULE | ORAL | Status: DC

## 2014-02-03 NOTE — Progress Notes (Signed)
Subjective:    Patient ID: Kathryn Lozano, female    DOB: 30-Oct-1985, 28 y.o.   MRN: [id number]  HPI Patient is here for CPE.  She has been on Depo-Provera for many years. We then performed a bone density test to rule out osteopenia or osteoporosis. Patient is now ready to consider a different form of contraception. She is totally against using birth control pills. She does not want her family for at least 2-4 years. She is very interested in possibly having an IUD. She also would like to discontinue vyvanse because it causes tachycardia. She like to try a different stimulant medication. She is not interested in Strattera at this time. She's also been having migraines, approximately 3 per month. These are pulsatile headaches with photophobia and phonophobia and nausea. Alleve does not always work. Past Medical History  Diagnosis Date  . Migraines   . ADHD (attention deficit hyperactivity disorder)   . Anxiety    No past surgical history on file. Current Outpatient Prescriptions on File Prior to Visit  Medication Sig Dispense Refill  . ALPRAZolam (XANAX) 0.5 MG tablet Take 0.5 mg by mouth every 8 (eight) hours as needed for sleep.      Marland Kitchen ibuprofen (ADVIL,MOTRIN) 200 MG tablet Take 800 mg by mouth once as needed. For back pain      . lisdexamfetamine (VYVANSE) 40 MG capsule Take 1 capsule (40 mg total) by mouth every morning.  30 capsule  0  . medroxyPROGESTERone (DEPO-PROVERA) 150 MG/ML injection Inject 1 mL (150 mg total) into the muscle every 3 (three) months.  1 mL  3  . mometasone (NASONEX) 50 MCG/ACT nasal spray Place 2 sprays into the nose daily.  17 g  3   No current facility-administered medications on file prior to visit.   No Known Allergies History   Social History  . Marital Status: Single    Spouse Name: N/A    Number of Children: N/A  . Years of Education: N/A   Occupational History  . Not on file.   Social History Main Topics  . Smoking status: Never Smoker   .  Smokeless tobacco: Not on file  . Alcohol Use: Yes  . Drug Use: No  . Sexual Activity: Not on file   Other Topics Concern  . Not on file   Social History Narrative  . No narrative on file   No family history on file.    Review of Systems  All other systems reviewed and are negative.      Objective:   Physical Exam  Vitals reviewed. Constitutional: She is oriented to person, place, and time. She appears well-developed and well-nourished. No distress.  HENT:  Head: Normocephalic and atraumatic.  Right Ear: External ear normal.  Left Ear: External ear normal.  Nose: Nose normal.  Mouth/Throat: Oropharynx is clear and moist. No oropharyngeal exudate.  Eyes: Conjunctivae and EOM are normal. Pupils are equal, round, and reactive to light. Right eye exhibits no discharge. Left eye exhibits no discharge. No scleral icterus.  Neck: Normal range of motion. Neck supple. No JVD present. No tracheal deviation present. No thyromegaly present.  Cardiovascular: Normal rate, regular rhythm, normal heart sounds and intact distal pulses.  Exam reveals no gallop and no friction rub.   No murmur heard. Pulmonary/Chest: Effort normal and breath sounds normal. No stridor. No respiratory distress. She has no wheezes. She has no rales. She exhibits no tenderness.  Abdominal: Soft. Bowel sounds are normal. She exhibits  no distension and no mass. There is no tenderness. There is no rebound and no guarding.  Musculoskeletal: Normal range of motion. She exhibits no edema and no tenderness.  Lymphadenopathy:    She has no cervical adenopathy.  Neurological: She is alert and oriented to person, place, and time. She has normal reflexes. She displays normal reflexes. No cranial nerve deficit. She exhibits normal muscle tone. Coordination normal.  Skin: Skin is warm. No rash noted. She is not diaphoretic. No erythema. No pallor.  Psychiatric: She has a normal mood and affect. Her behavior is normal. Judgment  and thought content normal.          Assessment & Plan:  1. ADD (attention deficit disorder) Discontinued vyvanse.  Try adderall xr 15 mg poqam.  Recheck in 1 month. - amphetamine-dextroamphetamine (ADDERALL XR) 15 MG 24 hr capsule; Take 1 capsule (15 mg total) by mouth every morning.  Dispense: 30 capsule; Refill: 0  2. Routine general medical examination at a health care facility Physical exam is completely normal. I will have the patient return for fasting for CBC, CMP, fasting lipid panel, and TSH. Otherwise regular preventive care is up to date. For the patient to gynecologist for Pap smear and also for placement of an IUD. I also recommended trying Imitrex 50 mg by mouth daily when necessary migraine headache. If she begins to have more than 4 severe migraines per month, also consider Topamax for migraine prevention.  3. Encounter for insertion of intrauterine contraceptive device  - Ambulatory referral to Gynecology

## 2014-02-07 ENCOUNTER — Telehealth: Payer: Self-pay | Admitting: Family Medicine

## 2014-02-07 NOTE — Telephone Encounter (Signed)
Patient is calling to discuss some side affects that the adderall dr pickard prescribed is causing her please call her back at [phone]

## 2014-02-07 NOTE — Telephone Encounter (Signed)
LMTRC

## 2014-02-17 NOTE — Telephone Encounter (Signed)
Message copied by Alyson Locket on Fri Feb 17, 2014  2:12 PM ------      Message from: Devoria Glassing      Created: Thu Feb 09, 2014  1:45 PM       Patient is returning your call about her adderall. She says it makes her feel like she has taken a sleeping pill and dosen't feel like this should be happening would like to know if he maybe could prescribe something different or maybe discuss these side affects please call her back at [phone] ------

## 2014-02-20 MED ORDER — METHYLPHENIDATE HCL ER (OSM) 36 MG PO TBCR: 36.0000 mg | EXTENDED_RELEASE_TABLET | Freq: Every day | ORAL | Status: DC

## 2014-02-20 NOTE — Telephone Encounter (Signed)
LMTRC

## 2014-02-20 NOTE — Telephone Encounter (Signed)
RX printed, left up front and patient aware to pick up and will make appt when she picks up rx

## 2014-02-20 NOTE — Telephone Encounter (Signed)
Try switching to Concerta 36 mg poqday and recheck in 1 month.

## 2014-03-06 ENCOUNTER — Emergency Department (HOSPITAL_COMMUNITY): Payer: 59

## 2014-03-06 ENCOUNTER — Encounter (HOSPITAL_COMMUNITY): Payer: Self-pay | Admitting: Emergency Medicine

## 2014-03-06 ENCOUNTER — Emergency Department (HOSPITAL_COMMUNITY)
Admission: EM | Admit: 2014-03-06 | Discharge: 2014-03-06 | Disposition: A | Discharge status: Home / Self Care | Payer: 59 | Attending: Emergency Medicine | Admitting: Emergency Medicine

## 2014-03-06 DIAGNOSIS — R109 Unspecified abdominal pain: Secondary | ICD-10-CM | POA: Insufficient documentation

## 2014-03-06 DIAGNOSIS — Z3202 Encounter for pregnancy test, result negative: Secondary | ICD-10-CM | POA: Diagnosis not present

## 2014-03-06 DIAGNOSIS — F909 Attention-deficit hyperactivity disorder, unspecified type: Secondary | ICD-10-CM | POA: Diagnosis not present

## 2014-03-06 DIAGNOSIS — Z87442 Personal history of urinary calculi: Secondary | ICD-10-CM | POA: Insufficient documentation

## 2014-03-06 DIAGNOSIS — Z79899 Other long term (current) drug therapy: Secondary | ICD-10-CM | POA: Insufficient documentation

## 2014-03-06 DIAGNOSIS — N83209 Unspecified ovarian cyst, unspecified side: Secondary | ICD-10-CM | POA: Diagnosis not present

## 2014-03-06 DIAGNOSIS — R1031 Right lower quadrant pain: Secondary | ICD-10-CM

## 2014-03-06 DIAGNOSIS — Z8679 Personal history of other diseases of the circulatory system: Secondary | ICD-10-CM | POA: Insufficient documentation

## 2014-03-06 DIAGNOSIS — N83201 Unspecified ovarian cyst, right side: Secondary | ICD-10-CM

## 2014-03-06 HISTORY — DX: Disorder of kidney and ureter, unspecified: N28.9

## 2014-03-06 LAB — CBC WITH DIFFERENTIAL/PLATELET
BASOS ABS: 0.1 10*3/uL (ref 0.0–0.1)
BASOS PCT: 1 % (ref 0–1)
Eosinophils Absolute: 0.1 10*3/uL (ref 0.0–0.7)
Eosinophils Relative: 2 % (ref 0–5)
HCT: 39.5 % (ref 36.0–46.0)
HEMOGLOBIN: 13.7 g/dL (ref 12.0–15.0)
Lymphocytes Relative: 34 % (ref 12–46)
Lymphs Abs: 2.4 10*3/uL (ref 0.7–4.0)
MCH: 28.1 pg (ref 26.0–34.0)
MCHC: 34.7 g/dL (ref 30.0–36.0)
MCV: 80.9 fL (ref 78.0–100.0)
MONOS PCT: 8 % (ref 3–12)
Monocytes Absolute: 0.6 10*3/uL (ref 0.1–1.0)
NEUTROS ABS: 3.8 10*3/uL (ref 1.7–7.7)
NEUTROS PCT: 55 % (ref 43–77)
PLATELETS: 212 10*3/uL (ref 150–400)
RBC: 4.88 MIL/uL (ref 3.87–5.11)
RDW: 12.4 % (ref 11.5–15.5)
WBC: 6.9 10*3/uL (ref 4.0–10.5)

## 2014-03-06 LAB — COMPREHENSIVE METABOLIC PANEL
ALK PHOS: 44 U/L (ref 39–117)
ALT: 18 U/L (ref 0–35)
AST: 15 U/L (ref 0–37)
Albumin: 3.7 g/dL (ref 3.5–5.2)
Anion gap: 13 (ref 5–15)
BUN: 13 mg/dL (ref 6–23)
CO2: 23 mEq/L (ref 19–32)
Calcium: 9 mg/dL (ref 8.4–10.5)
Chloride: 105 mEq/L (ref 96–112)
Creatinine, Ser: 0.85 mg/dL (ref 0.50–1.10)
GFR calc non Af Amer: >90 mL/min (ref >90)
GLUCOSE: 97 mg/dL (ref 70–99)
Potassium: 3.8 mEq/L (ref 3.7–5.3)
SODIUM: 141 meq/L (ref 137–147)
TOTAL PROTEIN: 6.9 g/dL (ref 6.0–8.3)
Total Bilirubin: <0.2 mg/dL — ABNORMAL LOW (ref 0.3–1.2)

## 2014-03-06 LAB — URINALYSIS, ROUTINE W REFLEX MICROSCOPIC
Bilirubin Urine: NEGATIVE
GLUCOSE, UA: NEGATIVE mg/dL
HGB URINE DIPSTICK: NEGATIVE
Ketones, ur: NEGATIVE mg/dL
Nitrite: NEGATIVE
PH: 6 (ref 5.0–8.0)
PROTEIN: NEGATIVE mg/dL
SPECIFIC GRAVITY, URINE: 1.021 (ref 1.005–1.030)
Urobilinogen, UA: 0.2 mg/dL (ref 0.0–1.0)

## 2014-03-06 LAB — URINE MICROSCOPIC-ADD ON

## 2014-03-06 LAB — LIPASE, BLOOD: Lipase: 39 U/L (ref 11–59)

## 2014-03-06 LAB — POC URINE PREG, ED: Preg Test, Ur: NEGATIVE

## 2014-03-06 MED ORDER — IOHEXOL 300 MG/ML  SOLN
100.0000 mL | Freq: Once | INTRAMUSCULAR | Status: AC | PRN
  Administered 2014-03-06: 100 mL via INTRAVENOUS

## 2014-03-06 MED ORDER — HYDROMORPHONE HCL PF 1 MG/ML IJ SOLN
1.0000 mg | Freq: Once | INTRAMUSCULAR | Status: AC
  Administered 2014-03-06: 1 mg via INTRAVENOUS
  Filled 2014-03-06: qty 1

## 2014-03-06 MED ORDER — DIPHENHYDRAMINE HCL 50 MG/ML IJ SOLN
12.5000 mg | Freq: Once | INTRAMUSCULAR | Status: AC
  Administered 2014-03-06: 12.5 mg via INTRAVENOUS
  Filled 2014-03-06: qty 1

## 2014-03-06 MED ORDER — HYDROCODONE-ACETAMINOPHEN 5-325 MG PO TABS: 1.0000 | ORAL_TABLET | ORAL | Status: DC | PRN

## 2014-03-06 MED ORDER — IOHEXOL 300 MG/ML  SOLN
25.0000 mL | Freq: Once | INTRAMUSCULAR | Status: AC | PRN
  Administered 2014-03-06: 25 mL via ORAL

## 2014-03-06 MED ORDER — ONDANSETRON HCL 4 MG/2ML IJ SOLN
4.0000 mg | Freq: Once | INTRAMUSCULAR | Status: AC
  Administered 2014-03-06: 4 mg via INTRAVENOUS
  Filled 2014-03-06: qty 2

## 2014-03-06 NOTE — ED Provider Notes (Signed)
CSN: [id number]     Arrival date & time 03/06/14  1401 History   First MD Initiated Contact with Patient 03/06/14 1418     Chief Complaint  Patient presents with  . Abdominal Pain     (Consider location/radiation/quality/duration/timing/severity/associated sxs/prior Treatment) HPI Comments: Patient presents today with acute onset of RLQ abdominal pain that came on suddenly today just prior to arrival in the ED.  Pain does not radiate.  She describes the pain as sharp and severe.  She has not taken anything for pain prior to arrival.  She reports that she does have a history of kidney stones, but that this pain feels different.  She has not taking anything for pain prior to arrival.  She states that she has never had pain like this before.  She denies flank pain, hematuria, fever, chills, nausea, vomiting, diarrhea, constipation, or urinary symptoms.    The history is provided by the patient.    Past Medical History  Diagnosis Date  . Migraines   . ADHD (attention deficit hyperactivity disorder)   . Anxiety   . Renal disorder     KIDNEY STONES   Past Surgical History  Procedure Laterality Date  . Lithotripsy     No family history on file. History  Substance Use Topics  . Smoking status: Never Smoker   . Smokeless tobacco: Not on file  . Alcohol Use: Yes   OB History   Grav Para Term Preterm Abortions TAB SAB Ect Mult Living                 Review of Systems  All other systems reviewed and are negative.     Allergies  Review of patient's allergies indicates no known allergies.  Home Medications   Prior to Admission medications   Medication Sig Start Date End Date Taking? Authorizing Provider  ALPRAZolam Duanne Moron) 0.5 MG tablet Take 0.5 mg by mouth every 8 (eight) hours as needed for sleep.   Yes Historical Provider, MD  medroxyPROGESTERone (DEPO-PROVERA) 150 MG/ML injection Inject 1 mL (150 mg total) into the muscle every 3 (three) months. 09/12/13  Yes Susy Frizzle, MD  methylphenidate (CONCERTA) 36 MG PO CR tablet Take 1 tablet (36 mg total) by mouth daily. 02/20/14  Yes Susy Frizzle, MD  naproxen sodium (ANAPROX) 220 MG tablet Take 440 mg by mouth 2 (two) times daily as needed (for pain or migraines).   Yes Historical Provider, MD   BP 132/101  Pulse 110  Temp(Src) 98.1 F (36.7 C) (Oral)  Ht 5\' 3"  (1.6 m)  Wt 173 lb (78.472 kg)  BMI 30.65 kg/m2  SpO2 98% Physical Exam  Nursing note and vitals reviewed. Constitutional: She appears well-developed and well-nourished. She appears distressed.  Uncomfortable appearing  HENT:  Head: Normocephalic and atraumatic.  Mouth/Throat: Oropharynx is clear and moist.  Neck: Normal range of motion. Neck supple.  Cardiovascular: Normal rate, regular rhythm and normal heart sounds.   Pulmonary/Chest: Effort normal and breath sounds normal.  Abdominal: Soft. Bowel sounds are normal. She exhibits no distension and no mass. There is tenderness in the right lower quadrant. There is guarding. There is no rebound and no CVA tenderness.  Genitourinary: Cervix exhibits no motion tenderness. Right adnexum displays tenderness. Right adnexum displays no mass and no fullness. Left adnexum displays no mass, no tenderness and no fullness.  Musculoskeletal: Normal range of motion.  Neurological: She is alert.  Skin: Skin is warm and dry.  Psychiatric: She has  a normal mood and affect.    ED Course  Procedures (including critical care time) Labs Review Labs Reviewed  CBC WITH DIFFERENTIAL  COMPREHENSIVE METABOLIC PANEL  LIPASE, BLOOD  URINALYSIS, ROUTINE W REFLEX MICROSCOPIC  POC URINE PREG, ED    Imaging Review US Transvaginal Non-ob  03/06/2014   CLINICAL DATA:  Acute onset of right lower quadrant abdominal pain; suspect ovarian torsion.  EXAM: TRANSABDOMINAL AND TRANSVAGINAL ULTRASOUND OF PELVIS  DOPPLER ULTRASOUND OF OVARIES  TECHNIQUE: Both transabdominal and transvaginal ultrasound examinations of  the pelvis were performed. Transabdominal technique was performed for global imaging of the pelvis including uterus, ovaries, adnexal regions, and pelvic cul-de-sac.  It was necessary to proceed with endovaginal exam following the transabdominal exam to visualize the ovaries. Color and duplex Doppler ultrasound was utilized to evaluate blood flow to the ovaries.  COMPARISON:  Pelvic ultrasound of February 21, 2006  FINDINGS: Uterus  Measurements: 3.9 x 1.4 x 2.4 cm. No fibroids or other mass visualized.  Endometrium  Thickness: 0.7 mm.  No focal abnormality visualized.  Right ovary  Measurements: 2.1 x 1.9 x 2.1 cm. There is a 1 cm simple appearing right ovarian cyst  Left ovary  Measurements: 1.9 x 1.3 x 1.8 cm. Normal appearance/no adnexal mass.  Pulsed Doppler evaluation of both ovaries demonstrates normal low-resistance arterial and venous waveforms.  Other findings  No free fluid.  IMPRESSION: 1. Vascularity of the ovaries is normal. There are no findings to suggest torsion. There is a 1 cm diameter simple appearing right ovarian cyst. 2. The uterus is normal in appearance. 3. There is no free pelvic fluid.   Electronically Signed   By: David  Martinique   On: 03/06/2014 15:40   US Pelvis Complete  03/06/2014   CLINICAL DATA:  Acute onset of right lower quadrant abdominal pain; suspect ovarian torsion.  EXAM: TRANSABDOMINAL AND TRANSVAGINAL ULTRASOUND OF PELVIS  DOPPLER ULTRASOUND OF OVARIES  TECHNIQUE: Both transabdominal and transvaginal ultrasound examinations of the pelvis were performed. Transabdominal technique was performed for global imaging of the pelvis including uterus, ovaries, adnexal regions, and pelvic cul-de-sac.  It was necessary to proceed with endovaginal exam following the transabdominal exam to visualize the ovaries. Color and duplex Doppler ultrasound was utilized to evaluate blood flow to the ovaries.  COMPARISON:  Pelvic ultrasound of February 21, 2006  FINDINGS: Uterus  Measurements: 3.9 x  1.4 x 2.4 cm. No fibroids or other mass visualized.  Endometrium  Thickness: 0.7 mm.  No focal abnormality visualized.  Right ovary  Measurements: 2.1 x 1.9 x 2.1 cm. There is a 1 cm simple appearing right ovarian cyst  Left ovary  Measurements: 1.9 x 1.3 x 1.8 cm. Normal appearance/no adnexal mass.  Pulsed Doppler evaluation of both ovaries demonstrates normal low-resistance arterial and venous waveforms.  Other findings  No free fluid.  IMPRESSION: 1. Vascularity of the ovaries is normal. There are no findings to suggest torsion. There is a 1 cm diameter simple appearing right ovarian cyst. 2. The uterus is normal in appearance. 3. There is no free pelvic fluid.   Electronically Signed   By: David  Martinique   On: 03/06/2014 15:40   Ct Abdomen Pelvis W Contrast  03/06/2014   CLINICAL DATA:  Sudden onset right lower quadrant abdominal pain. History of renal calculi.  EXAM: CT ABDOMEN AND PELVIS WITH CONTRAST  TECHNIQUE: Multidetector CT imaging of the abdomen and pelvis was performed using the standard protocol following bolus administration of intravenous contrast.  CONTRAST:  191mL OMNIPAQUE IOHEXOL 300 MG/ML  SOLN  COMPARISON:  Multiple exams, including 01/03/2010  FINDINGS: The liver, spleen, pancreas, and adrenal glands appear unremarkable. No specific gallbladder or biliary abnormality identified.  2 mm left kidney upper pole nonobstructive calculus. Several punctate 1 mm calculi in the left mid kidney. Is No hydronephrosis or hydroureter. No significant abnormal renal parenchymal enhancement. Urinary bladder unremarkable. No ureteral stones seen.  No pathologic upper abdominal adenopathy is observed. Appendix normal. Terminal ileum unremarkable. No dilated bowel. Abdominal wall musculature unremarkable as are the iliopsoas muscles.  Uterine and adnexal contours unremarkable.  No free fluid.  Bilateral chronic pars defects are present at L5 with 5 mm of grade 1 anterolisthesis at L5-S1. Also, there is a  central disc protrusion at the L4-5 level.  No pathologic pelvic adenopathy is observed.  IMPRESSION: 1. A cause for sudden onset right lower quadrant pain is not identified. 2. Nonobstructive left nephrolithiasis. 3. 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 There is a central disc protrusion at L4-5 not causing significant impingement.   Electronically Signed   By: Sherryl Barters M.D.   On: 03/06/2014 18:04   [place] Art/ven Flow Abd Pelv Doppler  03/06/2014   CLINICAL DATA:  Acute onset of right lower quadrant abdominal pain; suspect ovarian torsion.  EXAM: TRANSABDOMINAL AND TRANSVAGINAL ULTRASOUND OF PELVIS  DOPPLER ULTRASOUND OF OVARIES  TECHNIQUE: Both transabdominal and transvaginal ultrasound examinations of the pelvis were performed. Transabdominal technique was performed for global imaging of the pelvis including uterus, ovaries, adnexal regions, and pelvic cul-de-sac.  It was necessary to proceed with endovaginal exam following the transabdominal exam to visualize the ovaries. Color and duplex Doppler ultrasound was utilized to evaluate blood flow to the ovaries.  COMPARISON:  Pelvic ultrasound of February 21, 2006  FINDINGS: Uterus  Measurements: 3.9 x 1.4 x 2.4 cm. No fibroids or other mass visualized.  Endometrium  Thickness: 0.7 mm.  No focal abnormality visualized.  Right ovary  Measurements: 2.1 x 1.9 x 2.1 cm. There is a 1 cm simple appearing right ovarian cyst  Left ovary  Measurements: 1.9 x 1.3 x 1.8 cm. Normal appearance/no adnexal mass.  Pulsed Doppler evaluation of both ovaries demonstrates normal low-resistance arterial and venous waveforms.  Other findings  No free fluid.  IMPRESSION: 1. Vascularity of the ovaries is normal. There are no findings to suggest torsion. There is a 1 cm diameter simple appearing right ovarian cyst. 2. The uterus is normal in appearance. 3. There is no free pelvic fluid.   Electronically Signed   By: David  Martinique   On: 03/06/2014 15:40      EKG Interpretation   Date/Time:  Monday March 06 2014 14:13:32 EDT Ventricular Rate:  96 PR Interval:  131 QRS Duration: 88 QT Interval:  339 QTC Calculation: 428 R Axis:   67 Text Interpretation:  Sinus rhythm RSR' in V1 or V2, probably normal  variant Nonspecific T abnrm, anterolateral leads Baseline wander in  lead(s) II aVF Confirmed by Kathrynn Humble, MD, Thelma Comp [phone]) on 03/06/2014  2:44:53 PM     3:57 PM Reassessed patient.  She reports that her pain has improved at this time.  Repeat abdominal exam:  Tenderness to palpation localized to the RLQ with rebound and guarding.  Discussed pelvic ultrasound results with the patient.  Due to the fact that the patient is still having pain localized to the RLQ with guarding, will order CT ab/pelvis to rule out an Appendicitis.   4:00  PM Patient signed out to [employer] at shift change.  Pelvic ultrasound showing no evidence of Ovarian Torsion, but does show an Ovarian Cyst.  CT ab/pelvis with contrast pending to rule out an Acute Appendicitis.   MDM   Final diagnoses:  None   Patient presenting with acute onset of RLQ abdominal pain.  Patient with right adnexal tenderness with bimanual exam.  Due to the acute onset and severity of pain, initial concern was for possible Ovarian Torsion.  Pelvic ultrasound showing no evidence of torsion.  Urine pregnancy negative.  However, patient continued to have RLQ pain with guarding.  Therefore, CT ab/pelvis with contrast ordered to rule out an Acute Appendicitis.  Labs unremarkable.  Patient signed out at shift change.  CT ab/pelvis pending.       Hyman Bible, PA-C 03/07/14 [phone]

## 2014-03-06 NOTE — ED Provider Notes (Signed)
I was available for consultation during the completion of this patient's case.  Carmin Muskrat, MD 03/06/14 2337

## 2014-03-06 NOTE — Discharge Instructions (Signed)
Abdominal Pain  Many things can cause abdominal pain. Usually, abdominal pain is not caused by a disease and will improve without treatment. It can often be observed and treated at home. Your health care provider will do a physical exam and possibly order blood tests and X-rays to help determine the seriousness of your pain. However, in many cases, more time must pass before a clear cause of the pain can be found. Before that point, your health care provider may not know if you need more testing or further treatment.  HOME CARE INSTRUCTIONS   Monitor your abdominal pain for any changes. The following actions may help to alleviate any discomfort you are experiencing:  · Only take over-the-counter or prescription medicines as directed by your health care provider.  · Do not take laxatives unless directed to do so by your health care provider.  · Try a clear liquid diet (broth, tea, or water) as directed by your health care provider. Slowly move to a bland diet as tolerated.  SEEK MEDICAL CARE IF:  · You have unexplained abdominal pain.  · You have abdominal pain associated with nausea or diarrhea.  · You have pain when you urinate or have a bowel movement.  · You experience abdominal pain that wakes you in the night.  · You have abdominal pain that is worsened or improved by eating food.  · You have abdominal pain that is worsened with eating fatty foods.  · You have a fever.  SEEK IMMEDIATE MEDICAL CARE IF:   · Your pain does not go away within 2 hours.  · You keep throwing up (vomiting).  · Your pain is felt only in portions of the abdomen, such as the right side or the left lower portion of the abdomen.  · You pass bloody or black tarry stools.  MAKE SURE YOU:  · Understand these instructions.    · Will watch your condition.    · Will get help right away if you are not doing well or get worse.    Document Released: 04/02/2005 Document Revised: 06/28/2013 Document Reviewed: 03/02/2013  ExitCare® Patient Information  ©2015 [employer]. This information is not intended to replace advice given to you by your health care provider. Make sure you discuss any questions you have with your health care provider.        Ovarian Cyst  An ovarian cyst is a fluid-filled sac that forms on an ovary. The ovaries are small organs that produce eggs in women. Various types of cysts can form on the ovaries. Most are not cancerous. Many do not cause problems, and they often go away on their own. Some may cause symptoms and require treatment. Common types of ovarian cysts include:  · Functional cysts--These cysts may occur every month during the menstrual cycle. This is normal. The cysts usually go away with the next menstrual cycle if the woman does not get pregnant. Usually, there are no symptoms with a functional cyst.  · Endometrioma cysts--These cysts form from the tissue that lines the uterus. They are also called "chocolate cysts" because they become filled with blood that turns brown. This type of cyst can cause pain in the lower abdomen during intercourse and with your menstrual period.  · Cystadenoma cysts--This type develops from the cells on the outside of the ovary. These cysts can get very big and cause lower abdomen pain and pain with intercourse. This type of cyst can twist on itself, cut off its blood supply, and cause severe   pain. It can also easily rupture and cause a lot of pain.  · Dermoid cysts--This type of cyst is sometimes found in both ovaries. These cysts may contain different kinds of body tissue, such as skin, teeth, hair, or cartilage. They usually do not cause symptoms unless they get very big.  · Theca lutein cysts--These cysts occur when too much of a certain hormone (human chorionic gonadotropin) is produced and overstimulates the ovaries to produce an egg. This is most common after procedures used to assist with the conception of a baby (in vitro fertilization).  CAUSES   · Fertility drugs can cause a condition in  which multiple large cysts are formed on the ovaries. This is called ovarian hyperstimulation syndrome.  · A condition called polycystic ovary syndrome can cause hormonal imbalances that can lead to nonfunctional ovarian cysts.  SIGNS AND SYMPTOMS   Many ovarian cysts do not cause symptoms. If symptoms are present, they may include:  · Pelvic pain or pressure.  · Pain in the lower abdomen.  · Pain during sexual intercourse.  · Increasing girth (swelling) of the abdomen.  · Abnormal menstrual periods.  · Increasing pain with menstrual periods.  · Stopping having menstrual periods without being pregnant.  DIAGNOSIS   These cysts are commonly found during a routine or annual pelvic exam. Tests may be ordered to find out more about the cyst. These tests may include:  · Ultrasound.  · X-ray of the pelvis.  · CT scan.  · MRI.  · Blood tests.  TREATMENT   Many ovarian cysts go away on their own without treatment. Your health care provider may want to check your cyst regularly for 2-3 months to see if it changes. For women in menopause, it is particularly important to monitor a cyst closely because of the higher rate of ovarian cancer in menopausal women. When treatment is needed, it may include any of the following:  · A procedure to drain the cyst (aspiration). This may be done using a long needle and ultrasound. It can also be done through a laparoscopic procedure. This involves using a thin, lighted tube with a tiny camera on the end (laparoscope) inserted through a small incision.  · Surgery to remove the whole cyst. This may be done using laparoscopic surgery or an open surgery involving a larger incision in the lower abdomen.  · Hormone treatment or birth control pills. These methods are sometimes used to help dissolve a cyst.  HOME CARE INSTRUCTIONS   · Only take over-the-counter or prescription medicines as directed by your health care provider.  · Follow up with your health care provider as directed.  · Get  regular pelvic exams and Pap tests.  SEEK MEDICAL CARE IF:   · Your periods are late, irregular, or painful, or they stop.  · Your pelvic pain or abdominal pain does not go away.  · Your abdomen becomes larger or swollen.  · You have pressure on your bladder or trouble emptying your bladder completely.  · You have pain during sexual intercourse.  · You have feelings of fullness, pressure, or discomfort in your stomach.  · You lose weight for no apparent reason.  · You feel generally ill.  · You become constipated.  · You lose your appetite.  · You develop acne.  · You have an increase in body and facial hair.  · You are gaining weight, without changing your exercise and eating habits.  · You think you are pregnant.    SEEK IMMEDIATE MEDICAL CARE IF:   · You have increasing abdominal pain.  · You feel sick to your stomach (nauseous), and you throw up (vomit).  · You develop a fever that comes on suddenly.  · You have abdominal pain during a bowel movement.  · Your menstrual periods become heavier than usual.  MAKE SURE YOU:  · Understand these instructions.  · Will watch your condition.  · Will get help right away if you are not doing well or get worse.  Document Released: 06/23/2005 Document Revised: 06/28/2013 Document Reviewed: 02/28/2013  ExitCare® Patient Information ©2015 [employer]. This information is not intended to replace advice given to you by your health care provider. Make sure you discuss any questions you have with your health care provider.

## 2014-03-06 NOTE — ED Notes (Signed)
Pt placed in [place]. Hooked Pt to 12Lead, BP, Pulse Ox

## 2014-03-06 NOTE — ED Notes (Signed)
PT STATES THAT SHE BEGAN HAVING SEVERE RLQ PAIN THAT GOES THROUGH TO BACK. PT DENIES NAUSEA. DENIES URINARY SYMPTOMS. PT STATES THAT PAIN IS WORSE WITH BREATHING. PT STATES THAT SHE HAS HAD KIDNEY STONES IN THTE PAST BUT THIS FEELS DIFFERENT

## 2014-03-06 NOTE — ED Provider Notes (Signed)
Patient signed out to me by Emelda Brothers, PA-C.  Patient reports sudden onset right lower quadrant pain today.  Ultrasound remarkable for right ovarian cysts. Recommend women's followup.  CT is negative.  Discharge to home with return precautions.  Patient is reliable.  She is a paramedic and agrees with the plan.  Patient instructed to return for:  New or worsening symptoms, including, increased abdominal pain, especially pain that localizes to one side, bloody vomit, bloody diarrhea, fever >101, and intractable vomiting.  Results for orders placed during the hospital encounter of 03/06/14  CBC WITH DIFFERENTIAL      Result Value Ref Range   WBC 6.9  4.0 - 10.5 K/uL   RBC 4.88  3.87 - 5.11 MIL/uL   Hemoglobin 13.7  12.0 - 15.0 g/dL   HCT 39.5  36.0 - 46.0 %   MCV 80.9  78.0 - 100.0 fL   MCH 28.1  26.0 - 34.0 pg   MCHC 34.7  30.0 - 36.0 g/dL   RDW 12.4  11.5 - 15.5 %   Platelets 212  150 - 400 K/uL   Neutrophils Relative % 55  43 - 77 %   Neutro Abs 3.8  1.7 - 7.7 K/uL   Lymphocytes Relative 34  12 - 46 %   Lymphs Abs 2.4  0.7 - 4.0 K/uL   Monocytes Relative 8  3 - 12 %   Monocytes Absolute 0.6  0.1 - 1.0 K/uL   Eosinophils Relative 2  0 - 5 %   Eosinophils Absolute 0.1  0.0 - 0.7 K/uL   Basophils Relative 1  0 - 1 %   Basophils Absolute 0.1  0.0 - 0.1 K/uL  COMPREHENSIVE METABOLIC PANEL      Result Value Ref Range   Sodium 141  137 - 147 mEq/L   Potassium 3.8  3.7 - 5.3 mEq/L   Chloride 105  96 - 112 mEq/L   CO2 23  19 - 32 mEq/L   Glucose, Bld 97  70 - 99 mg/dL   BUN 13  6 - 23 mg/dL   Creatinine, Ser 0.85  0.50 - 1.10 mg/dL   Calcium 9.0  8.4 - 10.5 mg/dL   Total Protein 6.9  6.0 - 8.3 g/dL   Albumin 3.7  3.5 - 5.2 g/dL   AST 15  0 - 37 U/L   ALT 18  0 - 35 U/L   Alkaline Phosphatase 44  39 - 117 U/L   Total Bilirubin <0.2 (*) 0.3 - 1.2 mg/dL   GFR calc non Af Amer >90  >90 mL/min   GFR calc Af Amer >90  >90 mL/min   Anion gap 13  5 - 15  LIPASE, BLOOD      Result  Value Ref Range   Lipase 39  11 - 59 U/L  URINALYSIS, ROUTINE W REFLEX MICROSCOPIC      Result Value Ref Range   Color, Urine YELLOW  YELLOW   APPearance CLEAR  CLEAR   Specific Gravity, Urine 1.021  1.005 - 1.030   pH 6.0  5.0 - 8.0   Glucose, UA NEGATIVE  NEGATIVE mg/dL   Hgb urine dipstick NEGATIVE  NEGATIVE   Bilirubin Urine NEGATIVE  NEGATIVE   Ketones, ur NEGATIVE  NEGATIVE mg/dL   Protein, ur NEGATIVE  NEGATIVE mg/dL   Urobilinogen, UA 0.2  0.0 - 1.0 mg/dL   Nitrite NEGATIVE  NEGATIVE   Leukocytes, UA SMALL (*) NEGATIVE  URINE MICROSCOPIC-ADD ON  Result Value Ref Range   Squamous Epithelial / LPF RARE  RARE   WBC, UA 3-6  <3 WBC/hpf   Bacteria, UA RARE  RARE   Urine-Other MUCOUS PRESENT    POC URINE PREG, ED      Result Value Ref Range   Preg Test, Ur NEGATIVE  NEGATIVE   US Transvaginal Non-ob  03/06/2014   CLINICAL DATA:  Acute onset of right lower quadrant abdominal pain; suspect ovarian torsion.  EXAM: TRANSABDOMINAL AND TRANSVAGINAL ULTRASOUND OF PELVIS  DOPPLER ULTRASOUND OF OVARIES  TECHNIQUE: Both transabdominal and transvaginal ultrasound examinations of the pelvis were performed. Transabdominal technique was performed for global imaging of the pelvis including uterus, ovaries, adnexal regions, and pelvic cul-de-sac.  It was necessary to proceed with endovaginal exam following the transabdominal exam to visualize the ovaries. Color and duplex Doppler ultrasound was utilized to evaluate blood flow to the ovaries.  COMPARISON:  Pelvic ultrasound of February 21, 2006  FINDINGS: Uterus  Measurements: 3.9 x 1.4 x 2.4 cm. No fibroids or other mass visualized.  Endometrium  Thickness: 0.7 mm.  No focal abnormality visualized.  Right ovary  Measurements: 2.1 x 1.9 x 2.1 cm. There is a 1 cm simple appearing right ovarian cyst  Left ovary  Measurements: 1.9 x 1.3 x 1.8 cm. Normal appearance/no adnexal mass.  Pulsed Doppler evaluation of both ovaries demonstrates normal  low-resistance arterial and venous waveforms.  Other findings  No free fluid.  IMPRESSION: 1. Vascularity of the ovaries is normal. There are no findings to suggest torsion. There is a 1 cm diameter simple appearing right ovarian cyst. 2. The uterus is normal in appearance. 3. There is no free pelvic fluid.   Electronically Signed   By: David  [place]   On: 03/06/2014 15:40   US Pelvis Complete  03/06/2014   CLINICAL DATA:  Acute onset of right lower quadrant abdominal pain; suspect ovarian torsion.  EXAM: TRANSABDOMINAL AND TRANSVAGINAL ULTRASOUND OF PELVIS  DOPPLER ULTRASOUND OF OVARIES  TECHNIQUE: Both transabdominal and transvaginal ultrasound examinations of the pelvis were performed. Transabdominal technique was performed for global imaging of the pelvis including uterus, ovaries, adnexal regions, and pelvic cul-de-sac.  It was necessary to proceed with endovaginal exam following the transabdominal exam to visualize the ovaries. Color and duplex Doppler ultrasound was utilized to evaluate blood flow to the ovaries.  COMPARISON:  Pelvic ultrasound of February 21, 2006  FINDINGS: Uterus  Measurements: 3.9 x 1.4 x 2.4 cm. No fibroids or other mass visualized.  Endometrium  Thickness: 0.7 mm.  No focal abnormality visualized.  Right ovary  Measurements: 2.1 x 1.9 x 2.1 cm. There is a 1 cm simple appearing right ovarian cyst  Left ovary  Measurements: 1.9 x 1.3 x 1.8 cm. Normal appearance/no adnexal mass.  Pulsed Doppler evaluation of both ovaries demonstrates normal low-resistance arterial and venous waveforms.  Other findings  No free fluid.  IMPRESSION: 1. Vascularity of the ovaries is normal. There are no findings to suggest torsion. There is a 1 cm diameter simple appearing right ovarian cyst. 2. The uterus is normal in appearance. 3. There is no free pelvic fluid.   Electronically Signed   By: David  [place]   On: 03/06/2014 15:40   Ct Abdomen Pelvis W Contrast  03/06/2014   CLINICAL DATA:  Sudden onset  right lower quadrant abdominal pain. History of renal calculi.  EXAM: CT ABDOMEN AND PELVIS WITH CONTRAST  TECHNIQUE: Multidetector CT imaging of the abdomen and pelvis was performed  using the standard protocol following bolus administration of intravenous contrast.  CONTRAST:  176mL OMNIPAQUE IOHEXOL 300 MG/ML  SOLN  COMPARISON:  Multiple exams, including 01/03/2010  FINDINGS: The liver, spleen, pancreas, and adrenal glands appear unremarkable. No specific gallbladder or biliary abnormality identified.  2 mm left kidney upper pole nonobstructive calculus. Several punctate 1 mm calculi in the left mid kidney. Is No hydronephrosis or hydroureter. No significant abnormal renal parenchymal enhancement. Urinary bladder unremarkable. No ureteral stones seen.  No pathologic upper abdominal adenopathy is observed. Appendix normal. Terminal ileum unremarkable. No dilated bowel. Abdominal wall musculature unremarkable as are the iliopsoas muscles.  Uterine and adnexal contours unremarkable.  No free fluid.  Bilateral chronic pars defects are present at L5 with 5 mm of grade 1 anterolisthesis at L5-S1. Also, there is a central disc protrusion at the L4-5 level.  No pathologic pelvic adenopathy is observed.  IMPRESSION: 1. A cause for sudden onset right lower quadrant pain is not identified. 2. Nonobstructive left nephrolithiasis. 3. 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 There is a central disc protrusion at L4-5 not causing significant impingement.   Electronically Signed   By: Sherryl Barters M.D.   On: 03/06/2014 18:04   [place] Art/ven Flow Abd Pelv Doppler  03/06/2014   CLINICAL DATA:  Acute onset of right lower quadrant abdominal pain; suspect ovarian torsion.  EXAM: TRANSABDOMINAL AND TRANSVAGINAL ULTRASOUND OF PELVIS  DOPPLER ULTRASOUND OF OVARIES  TECHNIQUE: Both transabdominal and transvaginal ultrasound examinations of the pelvis were performed. Transabdominal technique was performed for  global imaging of the pelvis including uterus, ovaries, adnexal regions, and pelvic cul-de-sac.  It was necessary to proceed with endovaginal exam following the transabdominal exam to visualize the ovaries. Color and duplex Doppler ultrasound was utilized to evaluate blood flow to the ovaries.  COMPARISON:  Pelvic ultrasound of February 21, 2006  FINDINGS: Uterus  Measurements: 3.9 x 1.4 x 2.4 cm. No fibroids or other mass visualized.  Endometrium  Thickness: 0.7 mm.  No focal abnormality visualized.  Right ovary  Measurements: 2.1 x 1.9 x 2.1 cm. There is a 1 cm simple appearing right ovarian cyst  Left ovary  Measurements: 1.9 x 1.3 x 1.8 cm. Normal appearance/no adnexal mass.  Pulsed Doppler evaluation of both ovaries demonstrates normal low-resistance arterial and venous waveforms.  Other findings  No free fluid.  IMPRESSION: 1. Vascularity of the ovaries is normal. There are no findings to suggest torsion. There is a 1 cm diameter simple appearing right ovarian cyst. 2. The uterus is normal in appearance. 3. There is no free pelvic fluid.   Electronically Signed   By: David  [place]   On: 03/06/2014 15:40      Montine Circle, PA-C 03/06/14 [id number]

## 2014-03-09 NOTE — ED Provider Notes (Signed)
Medical screening examination/treatment/procedure(s) were conducted as a shared visit with non-physician practitioner(s) and myself.  I personally evaluated the patient during the encounter.   EKG Interpretation   Date/Time:  Monday March 06 2014 14:13:32 EDT Ventricular Rate:  96 PR Interval:  131 QRS Duration: 88 QT Interval:  339 QTC Calculation: 428 R Axis:   67 Text Interpretation:  Sinus rhythm RSR' in V1 or V2, probably normal  variant Nonspecific T abnrm, anterolateral leads Baseline wander in  lead(s) II aVF Confirmed by Kathrynn Humble, MD, Thelma Comp [phone]) on 03/06/2014  2:44:53 PM     PT with sudden onset RLQ pain. Different from her renal stones. Exam reveals RLQ tenderness, subjective guarding. PA-C to get pelvic, [place] ordered, and if neg, CT will be obtained.   Varney Biles, MD 03/09/14 [phone]

## 2014-03-22 ENCOUNTER — Telehealth: Payer: Self-pay | Admitting: Family Medicine

## 2014-03-22 NOTE — Telephone Encounter (Signed)
[phone] Patient is calling to get rx for her concerta if possible

## 2014-03-22 NOTE — Telephone Encounter (Signed)
?   OK to Refill  

## 2014-03-23 MED ORDER — METHYLPHENIDATE HCL ER (OSM) 36 MG PO TBCR: 36.0000 mg | EXTENDED_RELEASE_TABLET | Freq: Every day | ORAL | Status: DC

## 2014-03-23 NOTE — Telephone Encounter (Signed)
RX printed, left up front and patient aware to pick up  

## 2014-03-23 NOTE — Telephone Encounter (Signed)
ok 

## 2014-05-12 ENCOUNTER — Ambulatory Visit (INDEPENDENT_AMBULATORY_CARE_PROVIDER_SITE_OTHER): Payer: 59 | Admitting: Family Medicine

## 2014-05-12 ENCOUNTER — Encounter: Payer: Self-pay | Admitting: Family Medicine

## 2014-05-12 VITALS — BP 110/68 | HR 68 | Temp 98.6°F | Resp 16 | Ht 63.0 in | Wt 182.0 lb

## 2014-05-12 DIAGNOSIS — F988 Other specified behavioral and emotional disorders with onset usually occurring in childhood and adolescence: Secondary | ICD-10-CM

## 2014-05-12 DIAGNOSIS — F909 Attention-deficit hyperactivity disorder, unspecified type: Secondary | ICD-10-CM

## 2014-05-12 DIAGNOSIS — E669 Obesity, unspecified: Secondary | ICD-10-CM

## 2014-05-12 MED ORDER — METHYLPHENIDATE HCL ER (OSM) 18 MG PO TBCR: 18.0000 mg | EXTENDED_RELEASE_TABLET | Freq: Every day | ORAL | Status: DC

## 2014-05-12 NOTE — Progress Notes (Signed)
Subjective:    Patient ID: Kathryn Lozano, female    DOB: 1986-05-18, 28 y.o.   MRN: [id number]  HPI  Patient has a history of ADD. She is currently taking Concerta 36 mg by mouth every morning. The medication works extremely well and she is not having any difficulties with palpitations or racing heartbeats similar to what she was experiencing on Vyvanse. However the medication seems to wear off around lunchtime every day. 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  She is interested in taking a second dose around lunchtime to help complete the work day. She also is dealing with obesity. She has tried diet and exercise and she has been unsuccessful in losing weight. She is interested in an appetite suppressant. She does have a past medical history of migraines and has been interested in the past and using Topamax as a med to prevent migraine headaches. Past Medical History  Diagnosis Date  . Migraines   . ADHD (attention deficit hyperactivity disorder)   . Anxiety   . Renal disorder     KIDNEY STONES   Past Surgical History  Procedure Laterality Date  . Lithotripsy     Current Outpatient Prescriptions on File Prior to Visit  Medication Sig Dispense Refill  . methylphenidate (CONCERTA) 36 MG PO CR tablet Take 1 tablet (36 mg total) by mouth daily. 30 tablet 0  . naproxen sodium (ANAPROX) 220 MG tablet Take 440 mg by mouth 2 (two) times daily as needed (for pain or migraines).    . ALPRAZolam (XANAX) 0.5 MG tablet Take 0.5 mg by mouth every 8 (eight) hours as needed for sleep.     No current facility-administered medications on file prior to visit.   No Known Allergies History   Social History  . Marital Status: Single    Spouse Name: N/A    Number of Children: N/A  . Years of Education: N/A   Occupational History  . Not on file.   Social History Main Topics  . Smoking status: Never Smoker   . Smokeless tobacco: Not on file  . Alcohol Use: Yes  . Drug Use: No  . Sexual Activity:  Not on file   Other Topics Concern  . Not on file   Social History Narrative     Review of Systems  All other systems reviewed and are negative.      Objective:   Physical Exam  Constitutional: She is oriented to person, place, and time. She appears well-developed and well-nourished. No distress.  Neck: Neck supple. No thyromegaly present.  Cardiovascular: Normal rate, regular rhythm and normal heart sounds.   No murmur heard. Pulmonary/Chest: Effort normal and breath sounds normal. No respiratory distress. She has no wheezes. She has no rales.  Abdominal: Soft. Bowel sounds are normal.  Neurological: She is alert and oriented to person, place, and time. She has normal reflexes. She displays normal reflexes. No cranial nerve deficit. She exhibits normal muscle tone. Coordination normal.  Skin: She is not diaphoretic.  Vitals reviewed.         Assessment & Plan:  ADD (attention deficit disorder)  Obesity  Continue Concerta 36 mg by mouth every morning. Add Concerta 18 mg by mouth daily at lunchtime. If the patient does well on this regimen, she is interested in trying Topamax for a appetite suppressant and has a medication to help prevent migraines. I will begin the medication and 25 mg a day and gradually wean her up to  50 mg by mouth twice a day. We discussed the risk of birth defects on this medication and she is willing to take the medication. She was using an IUD for birth control.

## 2014-06-14 ENCOUNTER — Telehealth: Payer: Self-pay | Admitting: Family Medicine

## 2014-06-14 NOTE — Telephone Encounter (Signed)
Concerta working well.  Also would like to start the Topamax for her headaches.  Please advise.

## 2014-06-14 NOTE — Telephone Encounter (Signed)
Patient is calling to let dr pickard know that the concerta is working well and she needs refill on the 36 and 18mg , also she says that it had been discussed about being prescribed tomamax would like to talk about that too  Please call her back at [phone]

## 2014-06-15 MED ORDER — TOPIRAMATE 25 MG PO TABS: ORAL_TABLET | ORAL | Status: DC

## 2014-06-15 MED ORDER — METHYLPHENIDATE HCL ER (OSM) 36 MG PO TBCR: 36.0000 mg | EXTENDED_RELEASE_TABLET | Freq: Every day | ORAL | Status: DC

## 2014-06-15 MED ORDER — TOPIRAMATE 50 MG PO TABS: 50.0000 mg | ORAL_TABLET | Freq: Two times a day (BID) | ORAL | Status: DC

## 2014-06-15 MED ORDER — METHYLPHENIDATE HCL ER (OSM) 18 MG PO TBCR: 18.0000 mg | EXTENDED_RELEASE_TABLET | Freq: Every day | ORAL | Status: DC

## 2014-06-15 NOTE — Addendum Note (Signed)
Addended by: Olena Mater on: 06/15/2014 01:17 PM   Modules accepted: Orders

## 2014-06-15 NOTE — Telephone Encounter (Signed)
Start topamax 25 mg poqhs x 1 week, then 25 bid x 1 week, then 25 am and 50 pm x 1 week, then 50 bid thereafter.

## 2014-06-15 NOTE — Telephone Encounter (Signed)
Pt called with instructions for Topamax.  Three month worth of Concerta refills printed for provider signature.

## 2014-06-16 ENCOUNTER — Encounter: Payer: Self-pay | Admitting: Family Medicine

## 2014-06-16 ENCOUNTER — Ambulatory Visit (INDEPENDENT_AMBULATORY_CARE_PROVIDER_SITE_OTHER): Payer: 59 | Admitting: Family Medicine

## 2014-06-16 VITALS — BP 116/78 | HR 100 | Temp 101.3°F | Resp 20 | Wt 182.0 lb

## 2014-06-16 DIAGNOSIS — R509 Fever, unspecified: Secondary | ICD-10-CM

## 2014-06-16 DIAGNOSIS — J020 Streptococcal pharyngitis: Secondary | ICD-10-CM

## 2014-06-16 DIAGNOSIS — J029 Acute pharyngitis, unspecified: Secondary | ICD-10-CM

## 2014-06-16 LAB — INFLUENZA A AND B
Inflenza A Ag: NEGATIVE
Influenza B Ag: NEGATIVE

## 2014-06-16 LAB — RAPID STREP SCREEN (MED CTR MEBANE ONLY): Streptococcus, Group A Screen (Direct): POSITIVE — AB

## 2014-06-16 MED ORDER — AMOXICILLIN 875 MG PO TABS: 875.0000 mg | ORAL_TABLET | Freq: Two times a day (BID) | ORAL | Status: DC

## 2014-06-16 NOTE — Addendum Note (Signed)
Addended by: WRAY, [place] on: 06/16/2014 12:49 PM   Modules accepted: Orders

## 2014-06-16 NOTE — Progress Notes (Signed)
Subjective:    Patient ID: Kathryn Lozano, female    DOB: [birth year], 28 y.o.   MRN: [id number]  HPI  Patient's symptoms began approximately 3 days ago. Symptoms consist of a severe sore throat primarily on the left side, fever to 101/102, diffuse myalgias and arthralgias. She denies any rhinorrhea. She denies any cough. She denies any congestion. She has tender lymphadenopathy in the left anterior cervical chain. Past Medical History  Diagnosis Date  . Migraines   . ADHD (attention deficit hyperactivity disorder)   . Anxiety   . Renal disorder     KIDNEY STONES   Past Surgical History  Procedure Laterality Date  . Lithotripsy     Current Outpatient Prescriptions on File Prior to Visit  Medication Sig Dispense Refill  . ALPRAZolam (XANAX) 0.5 MG tablet Take 0.5 mg by mouth every 8 (eight) hours as needed for sleep.    . methylphenidate (CONCERTA) 18 MG PO CR tablet Take 1 tablet (18 mg total) by mouth daily. 30 tablet 0  . methylphenidate (CONCERTA) 36 MG PO CR tablet Take 1 tablet (36 mg total) by mouth daily. 30 tablet 0  . naproxen sodium (ANAPROX) 220 MG tablet Take 440 mg by mouth 2 (two) times daily as needed (for pain or migraines).    . topiramate (TOPAMAX) 25 MG tablet 25 mg by mouth at bedtime x 1 week, then 25 mg by mouth twice a day x 1 week, then 25mg  (1 tab) in the morning and 50 mg(2 tab) at bedtime x 1 week, then 50 mg(2 tab) twice daily. 70 tablet 0  . topiramate (TOPAMAX) 50 MG tablet Take 1 tablet (50 mg total) by mouth 2 (two) times daily. 60 tablet 2   No current facility-administered medications on file prior to visit.   No Known Allergies History   Social History  . Marital Status: Single    Spouse Name: N/A    Number of Children: N/A  . Years of Education: N/A   Occupational History  . Not on file.   Social History Main Topics  . Smoking status: Never Smoker   . Smokeless tobacco: Not on file  . Alcohol Use: Yes  . Drug Use: No  . Sexual  Activity: Not on file   Other Topics Concern  . Not on file   Social History Narrative      Review of Systems  All other systems reviewed and are negative.      Objective:   Physical Exam  Constitutional: She appears well-developed and well-nourished.  HENT:  Right Ear: External ear normal.  Left Ear: External ear normal.  Nose: Nose normal.  Mouth/Throat: Oropharyngeal exudate and posterior oropharyngeal erythema present.  Eyes: Conjunctivae are normal.  Cardiovascular: Normal rate, regular rhythm, normal heart sounds and intact distal pulses.   No murmur heard. Pulmonary/Chest: Effort normal and breath sounds normal. No respiratory distress. She has no wheezes. She has no rales.  Abdominal: Soft. Bowel sounds are normal. She exhibits no distension and no mass. There is no tenderness. There is no rebound and no guarding.  Lymphadenopathy:    She has cervical adenopathy.  Vitals reviewed.         Assessment & Plan:  Fever and chills - Plan: Influenza a and b, Rapid strep screen  Sorethroat - Plan: Influenza a and b, Rapid strep screen  Patient strep screen is positive. She has strep throat. Patient has severe erythema on the left peritonsillar fold and white exudate along  the left tonsil. Begin amoxicillin 875 mg by mouth twice a day for 10 days. Also recommended supportive therapy using Tylenol and ibuprofen for fever.

## 2014-07-10 ENCOUNTER — Other Ambulatory Visit: Payer: Self-pay | Admitting: Family Medicine

## 2014-07-14 ENCOUNTER — Telehealth: Payer: Self-pay | Admitting: Family Medicine

## 2014-07-14 ENCOUNTER — Ambulatory Visit: Payer: Self-pay | Admitting: Family Medicine

## 2014-07-14 NOTE — Telephone Encounter (Signed)
[phone] Pt went to ER last night and they are wanting her to be referred to GI and they was wanting her to go [place] but she does not she is wanting to [place] she is wanting to have some suggestions on where to go

## 2014-07-14 NOTE — Telephone Encounter (Signed)
Called pt back and she states that she went to [facility] in [place] for abdominal pain and nausea yesterday and they recommended her to make appt with an GI doctor in [place], pt wanted to know who was the best in [place] I told her who we recommend she states with her insurance she did not need a referral form PCP and would just scheudule with [facility].

## 2014-07-17 ENCOUNTER — Other Ambulatory Visit (HOSPITAL_COMMUNITY): Payer: Self-pay | Admitting: Gastroenterology

## 2014-07-17 DIAGNOSIS — R109 Unspecified abdominal pain: Secondary | ICD-10-CM

## 2014-07-27 ENCOUNTER — Encounter (HOSPITAL_COMMUNITY)
Admission: RE | Admit: 2014-07-27 | Discharge: 2014-07-27 | Disposition: A | Discharge status: Home / Self Care | Payer: 59 | Source: Ambulatory Visit | Attending: Gastroenterology | Admitting: Gastroenterology

## 2014-07-27 DIAGNOSIS — R1011 Right upper quadrant pain: Secondary | ICD-10-CM | POA: Insufficient documentation

## 2014-07-27 DIAGNOSIS — R109 Unspecified abdominal pain: Secondary | ICD-10-CM

## 2014-07-27 DIAGNOSIS — R110 Nausea: Secondary | ICD-10-CM | POA: Diagnosis not present

## 2014-07-27 MED ORDER — SINCALIDE 5 MCG IJ SOLR
0.0200 ug/kg | Freq: Once | INTRAMUSCULAR | Status: AC
  Administered 2014-07-27: 1.7 ug via INTRAVENOUS

## 2014-07-27 MED ORDER — TECHNETIUM TC 99M MEBROFENIN IV KIT
5.3000 | PACK | Freq: Once | INTRAVENOUS | Status: AC | PRN
  Administered 2014-07-27: 5.3 via INTRAVENOUS

## 2014-08-24 ENCOUNTER — Other Ambulatory Visit (INDEPENDENT_AMBULATORY_CARE_PROVIDER_SITE_OTHER): Payer: Self-pay | Admitting: Surgery

## 2014-09-05 ENCOUNTER — Encounter: Payer: Self-pay | Admitting: Family Medicine

## 2014-09-05 ENCOUNTER — Ambulatory Visit (INDEPENDENT_AMBULATORY_CARE_PROVIDER_SITE_OTHER): Payer: 59 | Admitting: Family Medicine

## 2014-09-05 VITALS — BP 132/78 | HR 80 | Temp 98.0°F | Resp 18 | Ht 63.0 in | Wt 182.0 lb

## 2014-09-05 DIAGNOSIS — R300 Dysuria: Secondary | ICD-10-CM

## 2014-09-05 DIAGNOSIS — N200 Calculus of kidney: Secondary | ICD-10-CM

## 2014-09-05 LAB — URINALYSIS, ROUTINE W REFLEX MICROSCOPIC
Bilirubin Urine: NEGATIVE
GLUCOSE, UA: NEGATIVE mg/dL
KETONES UR: NEGATIVE mg/dL
Leukocytes, UA: NEGATIVE
Nitrite: NEGATIVE
PROTEIN: NEGATIVE mg/dL
SPECIFIC GRAVITY, URINE: 1.015 (ref 1.005–1.030)
UROBILINOGEN UA: 0.2 mg/dL (ref 0.0–1.0)
pH: 6 (ref 5.0–8.0)

## 2014-09-05 LAB — URINALYSIS, MICROSCOPIC ONLY
CASTS: NONE SEEN
Crystals: NONE SEEN
WBC, UA: NONE SEEN WBC/hpf (ref <3)

## 2014-09-05 MED ORDER — TAMSULOSIN HCL 0.4 MG PO CAPS: 0.4000 mg | ORAL_CAPSULE | Freq: Every day | ORAL | Status: DC

## 2014-09-05 MED ORDER — KETOROLAC TROMETHAMINE 60 MG/2ML IM SOLN
60.0000 mg | Freq: Once | INTRAMUSCULAR | Status: AC
  Administered 2014-09-05: 60 mg via INTRAMUSCULAR

## 2014-09-05 MED ORDER — OXYCODONE-ACETAMINOPHEN 10-325 MG PO TABS: 1.0000 | ORAL_TABLET | ORAL | Status: DC | PRN

## 2014-09-05 NOTE — Progress Notes (Signed)
   Subjective:    Patient ID: Kathryn Lozano, female    DOB: Aug 05, 1985, 29 y.o.   MRN: [id number]  HPI  patient is here with sudden onset of left flank pain. The pain is constant and severe. It is 8 on a scale of 10. She has visible hematuria and microscopic hematuria on urinalysis. Patient feels like her previous kidney stones she has had. The pain radiates from her flank towards her lower left abdomen. She denies any fever or dysuria. She has a IUD and states that there is no chance of pregnancy. Past Medical History  Diagnosis Date  . Migraines   . ADHD (attention deficit hyperactivity disorder)   . Anxiety   . Renal disorder     KIDNEY STONES   Past Surgical History  Procedure Laterality Date  . Lithotripsy     Current Outpatient Prescriptions on File Prior to Visit  Medication Sig Dispense Refill  . levonorgestrel (MIRENA) 20 MCG/24HR IUD 1 each by Intrauterine route.    . methylphenidate (CONCERTA) 18 MG PO CR tablet Take 1 tablet (18 mg total) by mouth daily. (Patient taking differently: Take 18 mg by mouth daily after lunch. ) 30 tablet 0  . methylphenidate (CONCERTA) 36 MG PO CR tablet Take 1 tablet (36 mg total) by mouth daily. (Patient taking differently: Take 36 mg by mouth daily after breakfast. ) 30 tablet 0  . topiramate (TOPAMAX) 100 MG tablet Take 100 mg by mouth 2 (two) times daily.    Marland Kitchen ALPRAZolam (XANAX) 0.5 MG tablet Take 0.5 mg by mouth every 8 (eight) hours as needed for sleep.     No current facility-administered medications on file prior to visit.   No Known Allergies History   Social History  . Marital Status: Single    Spouse Name: N/A  . Number of Children: N/A  . Years of Education: N/A   Occupational History  . Not on file.   Social History Main Topics  . Smoking status: Never Smoker   . Smokeless tobacco: Not on file  . Alcohol Use: Yes  . Drug Use: No  . Sexual Activity: Not on file   Other Topics Concern  . Not on file   Social  History Narrative      Review of Systems  All other systems reviewed and are negative.      Objective:   Physical Exam  Constitutional: She appears well-developed and well-nourished.  Cardiovascular: Normal rate, regular rhythm and normal heart sounds.   No murmur heard. Pulmonary/Chest: Effort normal and breath sounds normal. No respiratory distress. She has no wheezes. She has no rales.  Abdominal: Soft. Bowel sounds are normal. She exhibits no distension and no mass. There is no tenderness. There is no rebound and no guarding.  Vitals reviewed.         Assessment & Plan:  Dysuria - Plan: Urinalysis, Routine w reflex microscopic  Nephrolithiasis - Plan: oxyCODONE-acetaminophen (PERCOCET) 10-325 MG per tablet, tamsulosin (FLOMAX) 0.4 MG CAPS capsule   Symptoms are consistent with a kidney stone. Begin Percocet 10/325 one by mouth every 4 hours when necessary pain. Also start Flomax 0.4 mg by mouth daily to facilitate passage. Drink plenty of fluids. If pain worsens  There is no better in 48 hours I would proceed with a CT scan to evaluate for the size of the kidney stone.

## 2014-09-05 NOTE — Pre-Procedure Instructions (Signed)
MARIELY MAHR  09/05/2014   Your procedure is scheduled on:  09-14-2014  Thursday    Report to [facility] Admitting at 5:30 AM.   Call this number if you have problems the morning of surgery: [phone]   Remember:   Do not eat food or drink liquids after midnight.    Take these medicines the morning of surgery with A SIP OF WATER:pain medication as needed,tamsulosin(Flomax),topiramate(Topamax)    Do not wear jewelry, make-up or nail polish.  Do not wear lotions, powders, or perfumes. You may not wear deodorant.  Do not shave 48 hours prior to surgery.   Do not bring valuables to the hospital.  [facility] is not responsible  for any belongings or valuables.               Contacts, dentures or bridgework may not be worn into surgery.   Leave suitcase in the car. After surgery it may be brought to your room.   For patients admitted to the hospital, discharge time is determined by your  treatment team.               Patients discharged the day of surgery will not be allowed to drive home.   Name and phone number of your driver:      Special Instructions: See attached sheet for instructions on CHG  Shower/bath     Please read over the following fact sheets that you were given: Pain Booklet, Coughing and Deep Breathing and Surgical Site Infection Prevention

## 2014-09-06 ENCOUNTER — Encounter (HOSPITAL_COMMUNITY)
Admission: RE | Admit: 2014-09-06 | Discharge: 2014-09-06 | Disposition: A | Discharge status: Home / Self Care | Payer: 59 | Source: Ambulatory Visit | Attending: Surgery | Admitting: Surgery

## 2014-09-06 ENCOUNTER — Encounter (HOSPITAL_COMMUNITY): Payer: Self-pay

## 2014-09-06 DIAGNOSIS — Z01812 Encounter for preprocedural laboratory examination: Secondary | ICD-10-CM | POA: Insufficient documentation

## 2014-09-06 DIAGNOSIS — K819 Cholecystitis, unspecified: Secondary | ICD-10-CM | POA: Insufficient documentation

## 2014-09-06 DIAGNOSIS — Z01818 Encounter for other preprocedural examination: Secondary | ICD-10-CM | POA: Diagnosis present

## 2014-09-06 LAB — CBC
HCT: 39.4 % (ref 36.0–46.0)
Hemoglobin: 13.2 g/dL (ref 12.0–15.0)
MCH: 28 pg (ref 26.0–34.0)
MCHC: 33.5 g/dL (ref 30.0–36.0)
MCV: 83.7 fL (ref 78.0–100.0)
Platelets: 211 10*3/uL (ref 150–400)
RBC: 4.71 MIL/uL (ref 3.87–5.11)
RDW: 13.2 % (ref 11.5–15.5)
WBC: 7.8 10*3/uL (ref 4.0–10.5)

## 2014-09-06 LAB — HCG, SERUM, QUALITATIVE: PREG SERUM: NEGATIVE

## 2014-09-07 ENCOUNTER — Telehealth: Payer: Self-pay | Admitting: Family Medicine

## 2014-09-07 NOTE — Telephone Encounter (Signed)
[phone] PT states she was seen on Tuesday for Kidney Stones and she is wanting to know if Dr Dennard Schaumann could look back at her CT of Abdomen she had done back in January at [place] She states that they should have sent [place] a report of it. She is wanting to see if it shows that poss kidney stones developing back in January

## 2014-09-07 NOTE — Telephone Encounter (Signed)
Patient is calling to ask some questions about the pain she is still having from kidney stone, pain went away then came back, not as bad, but still in pain  [phone]

## 2014-09-07 NOTE — Telephone Encounter (Signed)
LMTRC

## 2014-09-07 NOTE — Telephone Encounter (Signed)
Spoke to pt and she states that the pain is in the same place it has not moved and she has not had to take the pain medication in 2 days and she can not take while at work and she did go back to work. I informed her that per [facility] it can take 3-7 days to pass stone and if smaller then 5 mm she should be able to pass. If pt elected to we could obtain a CT scan to see where it was and how big. She stated that it will take to long as she is scheduled for surgery next week and she will just go to the ER to have it treated after waiting a few more days to see if it passes. If pt would like for [place] to order the CT she will call our office back.

## 2014-09-08 NOTE — Telephone Encounter (Signed)
Only CT I have is from 8/15 but it does show developing kidney stones:  2 mm left kidney upper pole nonobstructive calculus. Several punctate 1 mm calculi in the left mid kidney. Is No hydronephrosis or hydroureter. No significant abnormal renal parenchymal enhancement. Urinary bladder unremarkable. No ureteral stones seen.

## 2014-09-08 NOTE — Telephone Encounter (Signed)
LMTRC

## 2014-09-13 MED ORDER — CEFAZOLIN SODIUM-DEXTROSE 2-3 GM-% IV SOLR
2.0000 g | INTRAVENOUS | Status: AC
  Administered 2014-09-14: 2 g via INTRAVENOUS
  Filled 2014-09-13: qty 50

## 2014-09-13 NOTE — H&P (Signed)
Kathryn Lozano. Kathryn Lozano 08/24/2014 4:33 PM Location: [facility] Patient #: [id number] DOB: December 18, 1985 Single / Language: [place] / Race: White Female  History of Present Illness ([name] MD; 08/24/2014 4:53 PM) Patient words: gallbladder.  The patient is a 29 year old female who presents with abdominal pain. This is a very pleasant female referred to me by Dr. Dulce Sellar for evaluation of right upper quadrant abdominal pain. She's been having severe right upper quadrant abdominal pain hurting today at epigastrium for over a year with diarrhea. She has had occasional nausea but no emesis. This only occurs after fatty meals. The pain is sharp in nature. Her workup has been negative thus far including a HIDA scan, ultrasound, and CAT scan. She did however have symptoms with CCK administration during the HIDA scan which was exactly like her typical attacks   Other Problems Gilmer Mor, CMA; 08/24/2014 4:34 PM) Kidney Stone Migraine Headache  Past Surgical History Gilmer Mor, CMA; 08/24/2014 4:34 PM) Oral Surgery  Diagnostic Studies History Gilmer Mor, CMA; 08/24/2014 4:34 PM) Colonoscopy never Pap Smear 1-5 years ago  Allergies Gilmer Mor, CMA; 08/24/2014 4:35 PM) No Known Drug Allergies02/18/2016  Medication History Gilmer Mor, CMA; 08/24/2014 4:35 PM) Concerta (36MG  Tablet ER, Oral) Active. Concerta (18MG  Tablet ER, Oral) Active.  Social History Gilmer Mor, CMA; 08/24/2014 4:34 PM) Alcohol use Occasional alcohol use. Caffeine use Tea. No drug use Tobacco use Never smoker.  Family History Gilmer Mor, CMA; 08/24/2014 4:34 PM) Family history unknown First Degree Relatives  Pregnancy / Birth History Gilmer Mor, CMA; 08/24/2014 4:34 PM) Age at menarche 13 years. Contraceptive History Intrauterine device. Gravida 0 Irregular periods Para 0  Review of Systems ([name] CMA; 08/24/2014 4:34 PM) General Not Present- Appetite Loss,  Chills, Fatigue, Fever, Night Sweats, Weight Gain and Weight Loss. Skin Not Present- Change in Wart/Mole, Dryness, Hives, Jaundice, New Lesions, Non-Healing Wounds, Rash and Ulcer. HEENT Present- Seasonal Allergies and Wears glasses/contact lenses. Not Present- Earache, Hearing Loss, Hoarseness, Nose Bleed, Oral Ulcers, Ringing in the Ears, Sinus Pain, Sore Throat, Visual Disturbances and Yellow Eyes. Respiratory Not Present- Bloody sputum, Chronic Cough, Difficulty Breathing, Snoring and Wheezing. Breast Not Present- Breast Mass, Breast Pain, Nipple Discharge and Skin Changes. Cardiovascular Not Present- Chest Pain, Difficulty Breathing Lying Down, Leg Cramps, Palpitations, Rapid Heart Rate, Shortness of Breath and Swelling of Extremities. Gastrointestinal Present- Abdominal Pain and Chronic diarrhea. Not Present- Bloating, Bloody Stool, Change in Bowel Habits, Constipation, Difficulty Swallowing, Excessive gas, Gets full quickly at meals, Hemorrhoids, Indigestion, Nausea, Rectal Pain and Vomiting. Female Genitourinary Not Present- Frequency, Nocturia, Painful Urination, Pelvic Pain and Urgency. Musculoskeletal Not Present- Back Pain, Joint Pain, Joint Stiffness, Muscle Pain, Muscle Weakness and Swelling of Extremities. Neurological Not Present- Decreased Memory, Fainting, Headaches, Numbness, Seizures, Tingling, Tremor, Trouble walking and Weakness. Psychiatric Not Present- Anxiety, Bipolar, Change in Sleep Pattern, Depression, Fearful and Frequent crying. Endocrine Not Present- Cold Intolerance, Excessive Hunger, Hair Changes, Heat Intolerance, Hot flashes and New Diabetes. Hematology Not Present- Easy Bruising, Excessive bleeding, Gland problems, HIV and Persistent Infections.   Vitals ([name] CMA; 08/24/2014 4:34 PM) 08/24/2014 4:34 PM Weight: 187 lb Height: 63in Body Surface Area: 1.94 m Body Mass Index: 33.13 kg/m Temp.: 97.65F(Temporal)  Pulse: 77 (Regular)  BP: 124/80  (Sitting, Left Arm, Standard)    Physical Exam ([name] A. [name] MD; 08/24/2014 4:54 PM) General Mental Status-Alert. General Appearance-Consistent with stated age. Hydration-Well hydrated. Voice-Normal.  Head and Neck Head-normocephalic, atraumatic with no lesions or palpable  masses.  Eye Eyeball - Bilateral-Extraocular movements intact. Sclera/Conjunctiva - Bilateral-No scleral icterus.  Chest and Lung Exam Chest and lung exam reveals -quiet, even and easy respiratory effort with no use of accessory muscles and on auscultation, normal breath sounds, no adventitious sounds and normal vocal resonance. Inspection Chest Wall - Normal. Back - normal.  Cardiovascular Cardiovascular examination reveals -on palpation PMI is normal in location and amplitude, no palpable S3 or S4. Normal cardiac borders., normal heart sounds, regular rate and rhythm with no murmurs, carotid auscultation reveals no bruits and normal pedal pulses bilaterally.  Abdomen Inspection Inspection of the abdomen reveals - No Hernias. Skin - Scar - no surgical scars. Palpation/Percussion Palpation and Percussion of the abdomen Lozano - Soft, No Rebound tenderness, No Rigidity (guarding) and No hepatosplenomegaly. Tenderness - Right Upper Quadrant. Note: There is moderate tenderness with guarding in the right upper quadrant. Auscultation Auscultation of the abdomen reveals - Bowel sounds normal.  Neurologic Neurologic evaluation reveals -alert and oriented x 3 with no impairment of recent or remote memory. Mental Status-Normal.  Musculoskeletal Normal Exam - Left-Upper Extremity Strength Normal and Lower Extremity Strength Normal. Normal Exam - Right-Upper Extremity Strength Normal, Lower Extremity Weakness.    Assessment & Plan (Emmry Hinsch A. [name] MD; 08/24/2014 4:55 PM) CHRONIC CHOLECYSTITIS (575.11  K81.1) Impression: Based on her history, physical examination, and findings  on HIDA scan with symptoms reproduced by CCK administration, I do believe she has chronic cholecystitis and I recommend a laparoscopic cholecystectomy. I discussed this with her in detail and she is eager to proceed. I discussed the risk which includes but is not limited to bleeding, infection, bile duct injury, bile leak, the need to convert to an open procedure, the chances may not resolve her symptoms, etc. She understands and wished to proceed. Surgery will be scheduled

## 2014-09-14 ENCOUNTER — Ambulatory Visit (HOSPITAL_COMMUNITY): Payer: 59 | Admitting: Anesthesiology

## 2014-09-14 ENCOUNTER — Encounter (HOSPITAL_COMMUNITY): Payer: Self-pay | Admitting: *Deleted

## 2014-09-14 ENCOUNTER — Encounter (HOSPITAL_COMMUNITY)
Admission: RE | Disposition: A | Discharge status: Home / Self Care | Payer: Self-pay | Source: Ambulatory Visit | Attending: Surgery

## 2014-09-14 ENCOUNTER — Ambulatory Visit (HOSPITAL_COMMUNITY)
Admission: RE | Admit: 2014-09-14 | Discharge: 2014-09-14 | Disposition: A | Discharge status: Home / Self Care | Payer: 59 | Source: Ambulatory Visit | Attending: Surgery | Admitting: Surgery

## 2014-09-14 DIAGNOSIS — G43909 Migraine, unspecified, not intractable, without status migrainosus: Secondary | ICD-10-CM | POA: Diagnosis not present

## 2014-09-14 DIAGNOSIS — N200 Calculus of kidney: Secondary | ICD-10-CM

## 2014-09-14 DIAGNOSIS — K811 Chronic cholecystitis: Secondary | ICD-10-CM | POA: Diagnosis not present

## 2014-09-14 DIAGNOSIS — K819 Cholecystitis, unspecified: Secondary | ICD-10-CM | POA: Diagnosis present

## 2014-09-14 DIAGNOSIS — Z87442 Personal history of urinary calculi: Secondary | ICD-10-CM | POA: Diagnosis not present

## 2014-09-14 DIAGNOSIS — Z79899 Other long term (current) drug therapy: Secondary | ICD-10-CM | POA: Insufficient documentation

## 2014-09-14 HISTORY — DX: Nausea with vomiting, unspecified: R11.2

## 2014-09-14 HISTORY — DX: Other specified postprocedural states: Z98.890

## 2014-09-14 HISTORY — PX: CHOLECYSTECTOMY: SHX55

## 2014-09-14 SURGERY — LAPAROSCOPIC CHOLECYSTECTOMY
Anesthesia: General

## 2014-09-14 MED ORDER — HYDROMORPHONE HCL 1 MG/ML IJ SOLN
0.2500 mg | INTRAMUSCULAR | Status: DC | PRN
  Administered 2014-09-14 (×4): 0.5 mg via INTRAVENOUS

## 2014-09-14 MED ORDER — NEOSTIGMINE METHYLSULFATE 10 MG/10ML IV SOLN
INTRAVENOUS | Status: AC
  Filled 2014-09-14: qty 1

## 2014-09-14 MED ORDER — LIDOCAINE HCL (CARDIAC) 20 MG/ML IV SOLN
INTRAVENOUS | Status: AC
  Filled 2014-09-14: qty 5

## 2014-09-14 MED ORDER — HYDROMORPHONE HCL 1 MG/ML IJ SOLN
INTRAMUSCULAR | Status: AC
  Filled 2014-09-14: qty 1

## 2014-09-14 MED ORDER — NEOSTIGMINE METHYLSULFATE 10 MG/10ML IV SOLN
INTRAVENOUS | Status: DC | PRN
  Administered 2014-09-14: 5 mg via INTRAVENOUS

## 2014-09-14 MED ORDER — KETOROLAC TROMETHAMINE 30 MG/ML IJ SOLN
30.0000 mg | Freq: Once | INTRAMUSCULAR | Status: AC | PRN
  Administered 2014-09-14: 30 mg via INTRAVENOUS

## 2014-09-14 MED ORDER — ONDANSETRON HCL 4 MG/2ML IJ SOLN
INTRAMUSCULAR | Status: AC
  Filled 2014-09-14: qty 2

## 2014-09-14 MED ORDER — GLYCOPYRROLATE 0.2 MG/ML IJ SOLN
INTRAMUSCULAR | Status: AC
  Filled 2014-09-14: qty 4

## 2014-09-14 MED ORDER — SUCCINYLCHOLINE CHLORIDE 20 MG/ML IJ SOLN
INTRAMUSCULAR | Status: DC | PRN
  Administered 2014-09-14: 100 mg via INTRAVENOUS

## 2014-09-14 MED ORDER — MIDAZOLAM HCL 5 MG/5ML IJ SOLN
INTRAMUSCULAR | Status: DC | PRN
  Administered 2014-09-14: 2 mg via INTRAVENOUS

## 2014-09-14 MED ORDER — ROCURONIUM BROMIDE 100 MG/10ML IV SOLN
INTRAVENOUS | Status: DC | PRN
  Administered 2014-09-14: 15 mg via INTRAVENOUS

## 2014-09-14 MED ORDER — DEXAMETHASONE SODIUM PHOSPHATE 4 MG/ML IJ SOLN
INTRAMUSCULAR | Status: DC | PRN
  Administered 2014-09-14: 4 mg via INTRAVENOUS

## 2014-09-14 MED ORDER — FENTANYL CITRATE 0.05 MG/ML IJ SOLN
INTRAMUSCULAR | Status: DC | PRN
  Administered 2014-09-14: 50 ug via INTRAVENOUS
  Administered 2014-09-14: 100 ug via INTRAVENOUS

## 2014-09-14 MED ORDER — PROPOFOL 10 MG/ML IV BOLUS
INTRAVENOUS | Status: AC
  Filled 2014-09-14: qty 20

## 2014-09-14 MED ORDER — BUPIVACAINE-EPINEPHRINE (PF) 0.25% -1:200000 IJ SOLN
INTRAMUSCULAR | Status: AC
  Filled 2014-09-14: qty 30

## 2014-09-14 MED ORDER — OXYCODONE-ACETAMINOPHEN 10-325 MG PO TABS: 1.0000 | ORAL_TABLET | ORAL | Status: DC | PRN

## 2014-09-14 MED ORDER — ROCURONIUM BROMIDE 50 MG/5ML IV SOLN
INTRAVENOUS | Status: AC
  Filled 2014-09-14: qty 1

## 2014-09-14 MED ORDER — MIDAZOLAM HCL 2 MG/2ML IJ SOLN
INTRAMUSCULAR | Status: AC
  Filled 2014-09-14: qty 2

## 2014-09-14 MED ORDER — KETOROLAC TROMETHAMINE 30 MG/ML IJ SOLN
INTRAMUSCULAR | Status: AC
  Filled 2014-09-14: qty 1

## 2014-09-14 MED ORDER — MIDAZOLAM BOLUS VIA INFUSION
1.0000 mg | Freq: Once | INTRAVENOUS | Status: AC
  Administered 2014-09-14: 1 mg via INTRAVENOUS
  Filled 2014-09-14: qty 1

## 2014-09-14 MED ORDER — OXYCODONE-ACETAMINOPHEN 5-325 MG PO TABS
1.0000 | ORAL_TABLET | ORAL | Status: DC | PRN
  Administered 2014-09-14: 1 via ORAL
  Filled 2014-09-14: qty 1

## 2014-09-14 MED ORDER — GLYCOPYRROLATE 0.2 MG/ML IJ SOLN
INTRAMUSCULAR | Status: DC | PRN
  Administered 2014-09-14: .8 mg via INTRAVENOUS
  Administered 2014-09-14: 0.2 mg via INTRAVENOUS

## 2014-09-14 MED ORDER — ONDANSETRON HCL 4 MG/2ML IJ SOLN
4.0000 mg | Freq: Once | INTRAMUSCULAR | Status: AC | PRN
  Administered 2014-09-14: 4 mg via INTRAVENOUS

## 2014-09-14 MED ORDER — DEXAMETHASONE SODIUM PHOSPHATE 4 MG/ML IJ SOLN
INTRAMUSCULAR | Status: AC
  Filled 2014-09-14: qty 1

## 2014-09-14 MED ORDER — 0.9 % SODIUM CHLORIDE (POUR BTL) OPTIME
TOPICAL | Status: DC | PRN
  Administered 2014-09-14: 1000 mL

## 2014-09-14 MED ORDER — KETOROLAC TROMETHAMINE 30 MG/ML IJ SOLN
30.0000 mg | Freq: Once | INTRAMUSCULAR | Status: AC
  Administered 2014-09-14: 30 mg via INTRAVENOUS

## 2014-09-14 MED ORDER — ONDANSETRON HCL 4 MG/2ML IJ SOLN
INTRAMUSCULAR | Status: DC | PRN
  Administered 2014-09-14: 4 mg via INTRAVENOUS

## 2014-09-14 MED ORDER — SODIUM CHLORIDE 0.9 % IR SOLN
Status: DC | PRN
  Administered 2014-09-14: 1000 mL

## 2014-09-14 MED ORDER — BUPIVACAINE-EPINEPHRINE 0.25% -1:200000 IJ SOLN
INTRAMUSCULAR | Status: DC | PRN
  Administered 2014-09-14: 20 mL

## 2014-09-14 MED ORDER — SUCCINYLCHOLINE CHLORIDE 20 MG/ML IJ SOLN
INTRAMUSCULAR | Status: AC
  Filled 2014-09-14: qty 1

## 2014-09-14 MED ORDER — OXYCODONE-ACETAMINOPHEN 5-325 MG PO TABS
ORAL_TABLET | ORAL | Status: AC
  Filled 2014-09-14: qty 1

## 2014-09-14 MED ORDER — LACTATED RINGERS IV SOLN
INTRAVENOUS | Status: DC
  Administered 2014-09-14: 07:00:00 via INTRAVENOUS

## 2014-09-14 MED ORDER — FENTANYL CITRATE 0.05 MG/ML IJ SOLN
INTRAMUSCULAR | Status: AC
  Filled 2014-09-14: qty 5

## 2014-09-14 MED ORDER — PROPOFOL 10 MG/ML IV BOLUS
INTRAVENOUS | Status: DC | PRN
  Administered 2014-09-14: 130 mg via INTRAVENOUS

## 2014-09-14 MED ORDER — LIDOCAINE HCL (CARDIAC) 20 MG/ML IV SOLN
INTRAVENOUS | Status: DC | PRN
  Administered 2014-09-14: 100 mg via INTRAVENOUS

## 2014-09-14 SURGICAL SUPPLY — 37 items
APPLIER CLIP 5 13 M/L LIGAMAX5 (MISCELLANEOUS) ×2
APR CLP MED LRG 5 ANG JAW (MISCELLANEOUS) ×1
BAG SPEC RTRVL LRG 6X4 10 (ENDOMECHANICALS)
CANISTER SUCTION 2500CC (MISCELLANEOUS) ×2 IMPLANT
CHLORAPREP W/TINT 26ML (MISCELLANEOUS) ×2 IMPLANT
CLIP APPLIE 5 13 M/L LIGAMAX5 (MISCELLANEOUS) ×1 IMPLANT
COVER MAYO STAND STRL (DRAPES) IMPLANT
COVER SURGICAL LIGHT HANDLE (MISCELLANEOUS) ×2 IMPLANT
DECANTER SPIKE VIAL GLASS SM (MISCELLANEOUS) ×2 IMPLANT
DRAPE C-ARM 42X72 X-RAY (DRAPES) IMPLANT
DRAPE LAPAROSCOPIC ABDOMINAL (DRAPES) ×2 IMPLANT
ELECT REM PT RETURN 9FT ADLT (ELECTROSURGICAL) ×2
ELECTRODE REM PT RTRN 9FT ADLT (ELECTROSURGICAL) ×1 IMPLANT
GLOVE SURG SIGNA 7.5 PF LTX (GLOVE) ×2 IMPLANT
GOWN STRL REUS W/ TWL LRG LVL3 (GOWN DISPOSABLE) ×3 IMPLANT
GOWN STRL REUS W/ TWL XL LVL3 (GOWN DISPOSABLE) ×1 IMPLANT
GOWN STRL REUS W/TWL LRG LVL3 (GOWN DISPOSABLE) ×6
GOWN STRL REUS W/TWL XL LVL3 (GOWN DISPOSABLE) ×2
KIT BASIN OR (CUSTOM PROCEDURE TRAY) ×2 IMPLANT
KIT ROOM TURNOVER OR (KITS) ×2 IMPLANT
LIQUID BAND (GAUZE/BANDAGES/DRESSINGS) ×2 IMPLANT
NS IRRIG 1000ML POUR BTL (IV SOLUTION) ×2 IMPLANT
PAD ARMBOARD 7.5X6 YLW CONV (MISCELLANEOUS) ×2 IMPLANT
POUCH SPECIMEN RETRIEVAL 10MM (ENDOMECHANICALS) IMPLANT
SCISSORS LAP 5X35 DISP (ENDOMECHANICALS) ×2 IMPLANT
SET CHOLANGIOGRAPH 5 50 .035 (SET/KITS/TRAYS/PACK) IMPLANT
SET IRRIG TUBING LAPAROSCOPIC (IRRIGATION / IRRIGATOR) ×2 IMPLANT
SLEEVE ENDOPATH XCEL 5M (ENDOMECHANICALS) ×4 IMPLANT
SPECIMEN JAR SMALL (MISCELLANEOUS) ×2 IMPLANT
SUT MON AB 4-0 PC3 18 (SUTURE) ×2 IMPLANT
TOWEL OR 17X24 6PK STRL BLUE (TOWEL DISPOSABLE) ×2 IMPLANT
TOWEL OR 17X26 10 PK STRL BLUE (TOWEL DISPOSABLE) ×2 IMPLANT
TRAY LAPAROSCOPIC (CUSTOM PROCEDURE TRAY) ×2 IMPLANT
TROCAR XCEL BLUNT TIP 100MML (ENDOMECHANICALS) ×2 IMPLANT
TROCAR XCEL NON-BLD 5MMX100MML (ENDOMECHANICALS) ×2 IMPLANT
TUBING INSUFFLATION (TUBING) ×2 IMPLANT
WATER STERILE IRR 1000ML POUR (IV SOLUTION) IMPLANT

## 2014-09-14 NOTE — Interval H&P Note (Signed)
History and Physical Interval Note: no change in H and P  09/14/2014 6:43 AM  Kathryn Lozano  has presented today for surgery, with the diagnosis of Cholecystitis  The various methods of treatment have been discussed with the patient and family. After consideration of risks, benefits and other options for treatment, the patient has consented to  Procedure(s): LAPAROSCOPIC CHOLECYSTECTOMY (N/A) as a surgical intervention .  The patient's history has been reviewed, patient examined, no change in status, stable for surgery.  I have reviewed the patient's chart and labs.  Questions were answered to the patient's satisfaction.     Gery Sabedra A

## 2014-09-14 NOTE — Discharge Instructions (Signed)
[facility] ______[facility] °LAPAROSCOPIC SURGERY: POST OP INSTRUCTIONS °Always review your discharge instruction sheet given to you by the facility where your surgery was performed. °IF YOU HAVE DISABILITY OR FAMILY LEAVE FORMS, YOU MUST BRING THEM TO THE OFFICE FOR PROCESSING.   °DO NOT GIVE THEM TO YOUR DOCTOR. ° °1. A prescription for pain medication may be given to you upon discharge.  Take your pain medication as prescribed, if needed.  If narcotic pain medicine is not needed, then you may take acetaminophen (Tylenol) or ibuprofen (Advil) as needed. °2. Take your usually prescribed medications unless otherwise directed. °3. If you need a refill on your pain medication, please contact your pharmacy.  They will contact our office to request authorization. Prescriptions will not be filled after 5pm or on week-ends. °4. You should follow a light diet the first few days after arrival home, such as soup and crackers, etc.  Be sure to include lots of fluids daily. °5. Most patients will experience some swelling and bruising in the area of the incisions.  Ice packs will help.  Swelling and bruising can take several days to resolve.  °6. It is common to experience some constipation if taking pain medication after surgery.  Increasing fluid intake and taking a stool softener (such as Colace) will usually help or prevent this problem from occurring.  A mild laxative (Milk of Magnesia or Miralax) should be taken according to package instructions if there are no bowel movements after 48 hours. °7. Unless discharge instructions indicate otherwise, you may remove your bandages 24-48 hours after surgery, and you may shower at that time.  You may have steri-strips (small skin tapes) in place directly over the incision.  These strips should be left on the skin for 7-10 days.  If your surgeon used skin glue on the incision, you may shower in 24 hours.  The glue will flake off over the next 2-3 weeks.  Any sutures or  staples will be removed at the office during your follow-up visit. °8. ACTIVITIES:  You may resume regular (light) daily activities beginning the next day--such as daily self-care, walking, climbing stairs--gradually increasing activities as tolerated.  You may have sexual intercourse when it is comfortable.  Refrain from any heavy lifting or straining until approved by your doctor. °a. You may drive when you are no longer taking prescription pain medication, you can comfortably wear a seatbelt, and you can safely maneuver your car and apply brakes. °b. RETURN TO WORK:  __________________________________________________________ °9. You should see your doctor in the office for a follow-up appointment approximately 2-3 weeks after your surgery.  Make sure that you call for this appointment within a day or two after you arrive home to insure a convenient appointment time. °10. OTHER INSTRUCTIONS: __________________________________________________________________________________________________________________________ __________________________________________________________________________________________________________________________ °WHEN TO CALL YOUR DOCTOR: °1. Fever over 101.0 °2. Inability to urinate °3. Continued bleeding from incision. °4. Increased pain, redness, or drainage from the incision. °5. Increasing abdominal pain ° °The clinic staff is available to answer your questions during regular business hours.  Please don’t hesitate to call and ask to speak to one of the nurses for clinical concerns.  If you have a medical emergency, go to the nearest emergency room or call 911.  A surgeon from [facility] is always on call at the hospital. °[address], [address], [place], [place]  [address] ? [address] [address], [place],    [address] °[phone] ? [phone] ? FAX [phone] °Web site:   [email or web address] _____[facility]  [facility]URGERY: POST OP INSTRUCTIONS Always review your discharge instruction sheet given to you by the facility where your surgery was performed. IF YOU HAVE DISABILITY OR FAMILY LEAVE FORMS, YOU MUST BRING THEM TO THE OFFICE FOR PROCESSING.   DO NOT GIVE THEM TO YOUR DOCTOR.  11. A prescription for pain medication may be given to you upon discharge.  Take your pain medication as prescribed, if needed.  If narcotic pain medicine is not needed, then you may take acetaminophen (Tylenol) or ibuprofen (Advil) as needed. 12. Take your usually prescribed medications unless otherwise directed. 13. If you need a refill on your pain medication, please contact your pharmacy.  They will contact our office to request authorization. Prescriptions will not be filled after 5pm or on week-ends. 14. You should follow a light diet the first few days after arrival home, such as soup and crackers, etc.  Be sure to include lots of fluids daily. 15. Most patients will experience some swelling and bruising in the area of the incisions.  Ice packs will help.  Swelling and bruising can take several days to resolve.  16. It is common to experience some constipation if taking pain medication after surgery.  Increasing fluid intake and taking a stool softener (such as Colace) will usually help or prevent this problem from occurring.  A mild laxative (Milk of Magnesia or Miralax) should be taken according to package instructions if there are no bowel movements after 48 hours. 17. Unless discharge instructions indicate otherwise, you may remove your bandages 24-48 hours after surgery, and you may shower at that time.  You may have steri-strips (small skin tapes) in place directly over the incision.  These strips should be left on the skin for 7-10 days.  If your surgeon used skin glue on the incision, you may shower in 24 hours.  The glue will flake off over the next 2-3 weeks.  Any sutures or staples will be removed at the office during  your follow-up visit. 18. ACTIVITIES:  You may resume regular (light) daily activities beginning the next day--such as daily self-care, walking, climbing stairs--gradually increasing activities as tolerated.  You may have sexual intercourse when it is comfortable.  Refrain from any heavy lifting or straining until approved by your doctor. a. You may drive when you are no longer taking prescription pain medication, you can comfortably wear a seatbelt, and you can safely maneuver your car and apply brakes. b. RETURN TO WORK:  __________________________________________________________ 19. You should see your doctor in the office for a follow-up appointment approximately 2-3 weeks after your surgery.  Make sure that you call for this appointment within a day or two after you arrive home to insure a convenient appointment time. 20. OTHER INSTRUCTIONS: ___NO LIFTING MORE THAN 15 POUNDS FOR 2 WEEKS. 21. ICE PACK AND IBUPROFEN ALSO FOR PAIN 22. _______________________________________________________________________________________________________________________ __________________________________________________________________________________________________________________________ WHEN TO CALL YOUR DOCTOR: 6. Fever over 101.0 7. Inability to urinate 8. Continued bleeding from incision. 9. Increased pain, redness, or drainage from the incision. 10. Increasing abdominal pain  The clinic staff is available to answer your questions during regular business hours.  Please dont hesitate to call and ask to speak to one of the nurses for clinical concerns.  If you have a medical emergency, go to the nearest emergency room or call 911.  A surgeon from [facility] is always on call at the hospital. [address], [place], [place], [place]  [address] ?  [id number], [place], [place]   [address] [phone] ? [phone] ? FAX [phone] Web site: [email or web address]  General  Anesthesia, Adult, Care After  Refer to this sheet in the next few weeks. These instructions provide you with information on caring for yourself after your procedure. Your health care provider may also give you more specific instructions. Your treatment has been planned according to current medical practices, but problems sometimes occur. Call your health care provider if you have any problems or questions after your procedure.  WHAT TO EXPECT AFTER THE PROCEDURE  After the procedure, it is typical to experience:  Sleepiness.  Nausea and vomiting. HOME CARE INSTRUCTIONS  For the first 24 hours after general anesthesia:  Have a responsible person with you.  Do not drive a car. If you are alone, do not take public transportation.  Do not drink alcohol.  Do not take medicine that has not been prescribed by your health care provider.  Do not sign important papers or make important decisions.  You may resume a normal diet and activities as directed by your health care provider.  Change bandages (dressings) as directed.  If you have questions or problems that seem related to general anesthesia, call the hospital and ask for the anesthetist or anesthesiologist on call. SEEK MEDICAL CARE IF:  You have nausea and vomiting that continue the day after anesthesia.  You develop a rash. SEEK IMMEDIATE MEDICAL CARE IF:  You have difficulty breathing.  You have chest pain.  You have any allergic problems. Document Released: 09/29/2000 Document Revised: 02/23/2013 Document Reviewed: 01/06/2013  [facility] Patient Information 2014 [place], [place].

## 2014-09-14 NOTE — Progress Notes (Signed)
Pt still has not voided.  Encouraged pt to get up and ambulate.   She ambulated to the bathroom without difficulty but still has not voided.  She feels like the urge is there but no success.  She is not uncomfortable at this time and said she feels better after getting up.  IV fluids continued and po's encouraged.  She is going to drink and try to go to the bathroom again.

## 2014-09-14 NOTE — Transfer of Care (Signed)
Immediate Anesthesia Transfer of Care Note  Patient: Kathryn Lozano  Procedure(s) Performed: Procedure(s): LAPAROSCOPIC CHOLECYSTECTOMY (N/A)  Patient Location: PACU  Anesthesia Type:General  Level of Consciousness: awake, alert , oriented and patient cooperative  Airway & Oxygen Therapy: Patient Spontanous Breathing and Patient connected to nasal cannula oxygen  Post-op Assessment: Report given to RN, Post -op Vital signs reviewed and stable and Patient moving all extremities  Post vital signs: Reviewed and stable   Complications: No apparent anesthesia complications

## 2014-09-14 NOTE — Op Note (Signed)

## 2014-09-14 NOTE — Anesthesia Postprocedure Evaluation (Signed)
  Anesthesia Post-op Note  Patient: Kathryn Lozano  Procedure(s) Performed: Procedure(s): LAPAROSCOPIC CHOLECYSTECTOMY (N/A)  Patient Location: PACU  Anesthesia Type:General  Level of Consciousness: awake, alert  and oriented  Airway and Oxygen Therapy: Patient Spontanous Breathing and Patient connected to nasal cannula oxygen  Post-op Pain: mild  Post-op Assessment: Post-op Vital signs reviewed, Patient's Cardiovascular Status Stable, Respiratory Function Stable, Patent Airway and No signs of Nausea or vomiting  Post-op Vital Signs: stable  Last Vitals:  Filed Vitals:   09/14/14 1115  BP:   Pulse: 57  Temp:   Resp: 12    Complications: No apparent anesthesia complications

## 2014-09-14 NOTE — Anesthesia Preprocedure Evaluation (Signed)
Anesthesia Evaluation  Patient identified by MRN, date of birth, ID band Patient awake    Reviewed: Allergy & Precautions, NPO status , Patient's Chart, lab work & pertinent test results  Airway Mallampati: II  TM Distance: >3 FB Neck ROM: Full    Dental  (+) Teeth Intact, Dental Advisory Given   Pulmonary  breath sounds clear to auscultation        Cardiovascular Rhythm:Regular Rate:Normal     Neuro/Psych    GI/Hepatic   Endo/Other    Renal/GU      Musculoskeletal   Abdominal   Peds  Hematology   Anesthesia Other Findings   Reproductive/Obstetrics                             Anesthesia Physical Anesthesia Plan  ASA: II  Anesthesia Plan: General   Post-op Pain Management:    Induction: Intravenous  Airway Management Planned: Oral ETT  Additional Equipment:   Intra-op Plan:   Post-operative Plan:   Informed Consent: I have reviewed the patients History and Physical, chart, labs and discussed the procedure including the risks, benefits and alternatives for the proposed anesthesia with the patient or authorized representative who has indicated his/her understanding and acceptance.   Dental advisory given  Plan Discussed with: CRNA and Anesthesiologist  Anesthesia Plan Comments: (Symptomatic cholelithiasis Migraines Nausea with pain medications)        Anesthesia Quick Evaluation

## 2014-09-14 NOTE — Anesthesia Procedure Notes (Signed)
Procedure Name: Intubation Date/Time: 09/14/2014 7:33 AM Performed by: Kyung Rudd Pre-anesthesia Checklist: Patient identified, Emergency Drugs available, Suction available, Patient being monitored and Timeout performed Patient Re-evaluated:Patient Re-evaluated prior to inductionOxygen Delivery Method: Circle system utilized Preoxygenation: Pre-oxygenation with 100% oxygen Intubation Type: IV induction Ventilation: Mask ventilation without difficulty Laryngoscope Size: Mac and 3 Grade View: Grade I Tube type: Oral Tube size: 7.0 mm Number of attempts: 1 Airway Equipment and Method: Stylet Placement Confirmation: ETT inserted through vocal cords under direct vision,  positive ETCO2 and breath sounds checked- equal and bilateral Secured at: 21 cm Tube secured with: Tape Dental Injury: Teeth and Oropharynx as per pre-operative assessment

## 2014-09-17 ENCOUNTER — Encounter (HOSPITAL_COMMUNITY): Payer: Self-pay | Admitting: Surgery

## 2014-10-29 ENCOUNTER — Other Ambulatory Visit: Payer: Self-pay | Admitting: Family Medicine

## 2014-11-27 ENCOUNTER — Telehealth: Payer: Self-pay | Admitting: Family Medicine

## 2014-11-27 MED ORDER — METHYLPHENIDATE HCL ER (OSM) 18 MG PO TBCR: 18.0000 mg | EXTENDED_RELEASE_TABLET | Freq: Every day | ORAL | Status: DC

## 2014-11-27 MED ORDER — METHYLPHENIDATE HCL ER (OSM) 36 MG PO TBCR: 36.0000 mg | EXTENDED_RELEASE_TABLET | Freq: Every day | ORAL | Status: DC

## 2014-11-27 NOTE — Telephone Encounter (Signed)
?   OK to Refill  

## 2014-11-27 NOTE — Telephone Encounter (Signed)
ok 

## 2014-11-27 NOTE — Telephone Encounter (Signed)
RX printed, left up front and patient aware to pick up via vm 

## 2014-11-27 NOTE — Telephone Encounter (Signed)
[phone] PT is needing a refill on concerta and she would like a 3 month supply

## 2014-12-05 ENCOUNTER — Encounter: Payer: Self-pay | Admitting: Family Medicine

## 2014-12-05 ENCOUNTER — Ambulatory Visit (INDEPENDENT_AMBULATORY_CARE_PROVIDER_SITE_OTHER): Payer: 59 | Admitting: Family Medicine

## 2014-12-05 VITALS — BP 128/64 | HR 68 | Temp 98.2°F | Resp 14 | Ht 63.0 in | Wt 170.0 lb

## 2014-12-05 DIAGNOSIS — R05 Cough: Secondary | ICD-10-CM

## 2014-12-05 DIAGNOSIS — R0982 Postnasal drip: Secondary | ICD-10-CM | POA: Diagnosis not present

## 2014-12-05 DIAGNOSIS — R053 Chronic cough: Secondary | ICD-10-CM

## 2014-12-05 MED ORDER — DEXLANSOPRAZOLE 60 MG PO CPDR: 60.0000 mg | DELAYED_RELEASE_CAPSULE | Freq: Every day | ORAL | Status: DC

## 2014-12-05 MED ORDER — ALPRAZOLAM 0.5 MG PO TABS: 0.5000 mg | ORAL_TABLET | Freq: Three times a day (TID) | ORAL | Status: DC | PRN

## 2014-12-05 MED ORDER — BECLOMETHASONE DIPROPIONATE 80 MCG/ACT NA AERS: INHALATION_SPRAY | NASAL | Status: DC

## 2014-12-05 NOTE — Assessment & Plan Note (Signed)
?   related to postnasal drip vs GERD. Will have her try Dexilant, given samples for 10 days, if no change, try qnasal spray given samples,may need pulmonary

## 2014-12-05 NOTE — Patient Instructions (Signed)
Take dexilant instead of Prilosec once a day  Try the nasal spray Call to let us know if either helped F/u Dr. Dennard Schaumann about her ADHD medication

## 2014-12-05 NOTE — Progress Notes (Signed)
Patient ID: Kathryn Lozano, female   DOB: 1986/04/21, 29 y.o.   MRN: [id number]   Subjective:    Patient ID: Kathryn Lozano, female    DOB: February 23, 1986, 29 y.o.   MRN: [id number]  Patient presents for Cough  agent here with cough for the past 2 months. Her cough is typically productive with [place] acidic-like sputum sometimes it is very thick and she coughs up yellow chunks. She typically coughs worse after she eats. She's had this issue on and off for many years especially during the spring time however after she had her gallbladder out earlier this year it has worsened. She did try taking Prilosec but had minimal improvement. She has tried multiple allergy tablets in the past with no improvement. She denies any fever or shortness of breath no wheezing.  Request refill on xanax, uses during severe thunderstorms only  Review Of Systems:  GEN- denies fatigue, fever, weight loss,weakness, recent illness HEENT- denies eye drainage, change in vision, nasal discharge, CVS- denies chest pain, palpitations RESP- denies SOB, +cough, wheeze ABD- denies N/V, change in stools, abd pain GU- denies dysuria, hematuria, dribbling, incontinence MSK- denies joint pain, muscle aches, injury Neuro- denies headache, dizziness, syncope, seizure activity       Objective:    BP 128/64 mmHg  Pulse 68  Temp(Src) 98.2 F (36.8 C) (Oral)  Resp 14  Ht 5\' 3"  (1.6 m)  Wt 170 lb (77.111 kg)  BMI 30.12 kg/m2 GEN- NAD, alert and oriented x3 HEENT- PERRL, EOMI, non injected sclera, pink conjunctiva, MMM, oropharynx clear Neck- Supple, no thyromegaly CVS- RRR, no murmur RESP-CTAB ABD-NABS,soft,NT,ND EXT- No edema Pulses- Radial,  2+        Assessment & Plan:      Problem List Items Addressed This Visit    Chronic cough - Primary      Note: This dictation was prepared with Dragon dictation along with smaller phrase technology. Any transcriptional errors that result from this process are unintentional.

## 2014-12-20 ENCOUNTER — Encounter: Payer: Self-pay | Admitting: Family Medicine

## 2014-12-20 ENCOUNTER — Ambulatory Visit (INDEPENDENT_AMBULATORY_CARE_PROVIDER_SITE_OTHER): Payer: 59 | Admitting: Family Medicine

## 2014-12-20 VITALS — BP 124/60 | HR 78 | Temp 98.2°F | Resp 16 | Ht 63.0 in | Wt 167.0 lb

## 2014-12-20 DIAGNOSIS — J302 Other seasonal allergic rhinitis: Secondary | ICD-10-CM

## 2014-12-20 DIAGNOSIS — R05 Cough: Secondary | ICD-10-CM | POA: Diagnosis not present

## 2014-12-20 DIAGNOSIS — J0100 Acute maxillary sinusitis, unspecified: Secondary | ICD-10-CM | POA: Diagnosis not present

## 2014-12-20 DIAGNOSIS — J069 Acute upper respiratory infection, unspecified: Secondary | ICD-10-CM

## 2014-12-20 DIAGNOSIS — R053 Chronic cough: Secondary | ICD-10-CM

## 2014-12-20 MED ORDER — BECLOMETHASONE DIPROPIONATE 80 MCG/ACT NA AERS: INHALATION_SPRAY | NASAL | Status: DC

## 2014-12-20 MED ORDER — AMOXICILLIN-POT CLAVULANATE 875-125 MG PO TABS: 1.0000 | ORAL_TABLET | Freq: Two times a day (BID) | ORAL | Status: DC

## 2014-12-20 MED ORDER — GUAIFENESIN-CODEINE 100-10 MG/5ML PO SOLN: 5.0000 mL | Freq: Four times a day (QID) | ORAL | Status: DC | PRN

## 2014-12-20 MED ORDER — METHYLPREDNISOLONE ACETATE 40 MG/ML IJ SUSP
40.0000 mg | Freq: Once | INTRAMUSCULAR | Status: AC
  Administered 2014-12-20: 40 mg via INTRAMUSCULAR

## 2014-12-20 NOTE — Assessment & Plan Note (Signed)
Treat per above for acute on chronic illness, but concern she may have cough variant asthma or severe allergies with post nasal drip,refer to asthma and allergy specialist

## 2014-12-20 NOTE — Patient Instructions (Signed)
Referral  To asthma and allergy  Prednisone shot given  Take antibiotics  F/U as needed

## 2014-12-20 NOTE — Addendum Note (Signed)
Addended by: Sheral Flow on: 12/20/2014 12:52 PM   Modules accepted: Orders

## 2014-12-20 NOTE — Progress Notes (Signed)
Patient ID: Kathryn Lozano, female   DOB: 04-Feb-1986, 29 y.o.   MRN: [id number]   Subjective:    Patient ID: Kathryn Lozano, female    DOB: 1985/12/22, 29 y.o.   MRN: [id number]  Patient presents for Illness  Pt here with  worsening  Cough with mild production sinus pressure or drainage headaches sore throat worsening over the past week. She did start the Qnasl spray which is helped in the mediastinum was no significant change with the Dexilant. Denies fever with above symptoms.     Review Of Systems:  GEN- denies fatigue, fever, weight loss,weakness, recent illness HEENT- denies eye drainage, change in vision, +nasal discharge, CVS- denies chest pain, palpitations RESP- denies SOB, +cough, wheeze ABD- denies N/V, change in stools, abd pain MSK- denies joint pain, muscle aches, injury Neuro- denies headache, dizziness, syncope, seizure activity       Objective:    BP 124/60 mmHg  Pulse 78  Temp(Src) 98.2 F (36.8 C) (Oral)  Resp 16  Ht 5\' 3"  (1.6 m)  Wt 167 lb (75.751 kg)  BMI 29.59 kg/m2 GEN- NAD, alert and oriented x3 HEENT- PERRL, EOMI, non injected sclera, pink conjunctiva, MMM, oropharynx mild injection, TM clear bilat no effusion,  + maxillary sinus tenderness, inflammed turbinates,  Nasal drainage  Neck- Supple, no LAD CVS- RRR, no murmur RESP-CTAB EXT- No edema Pulses- Radial 2+          Assessment & Plan:      Problem List Items Addressed This Visit    None    Visit Diagnoses    Acute maxillary sinusitis, recurrence not specified    -  Primary    Treat for sinusitis, and URI, given augmentin x 10 days, continue Qnasl, robitussin with codiene for cough Depo Medrol 40mg  IM given as well    Relevant Medications    amoxicillin-clavulanate (AUGMENTIN) 875-125 MG per tablet    guaiFENesin-codeine 100-10 MG/5ML syrup    Beclomethasone Dipropionate (QNASL) 80 MCG/ACT AERS    Acute URI           Note: This dictation was prepared with Dragon dictation  along with smaller phrase technology. Any transcriptional errors that result from this process are unintentional.

## 2014-12-21 ENCOUNTER — Ambulatory Visit: Payer: 59 | Admitting: Family Medicine

## 2014-12-22 ENCOUNTER — Telehealth: Payer: Self-pay | Admitting: Family Medicine

## 2014-12-22 NOTE — Telephone Encounter (Signed)
Noted, based on behavior will not call in any further medications Pt was not receptive of what the nurse was trying to tell her

## 2014-12-22 NOTE — Telephone Encounter (Signed)
Received call from patient. Patient upset because she has not heard response from anyone in office yet. Apologized for delay in returning call.   Patient states that cough has worsened and Robitussin AC is not effective. Advised of MD recommendations.  Patient became belligerent and began yelling and cursing. Writer requested that the patient refrain from [place] and patient hung up on Probation officer.   MD to be made aware.

## 2014-12-22 NOTE — Telephone Encounter (Signed)
[facility]   [phone]  Patient was in on Tuesday and is not any better would like to know if dr pickard could call in a cough syrup for her or she wants to know should there maybe be a chest xray?

## 2014-12-22 NOTE — Telephone Encounter (Signed)
Okay to send for CXR-also robitussin with codiene was called in already

## 2014-12-22 NOTE — Addendum Note (Signed)
Addended by: Vic Blackbird F on: 12/22/2014 04:26 PM   Modules accepted: Orders

## 2014-12-25 ENCOUNTER — Encounter: Payer: Self-pay | Admitting: Family Medicine

## 2014-12-25 ENCOUNTER — Ambulatory Visit (INDEPENDENT_AMBULATORY_CARE_PROVIDER_SITE_OTHER): Payer: 59 | Admitting: Family Medicine

## 2014-12-25 VITALS — BP 104/62 | HR 78 | Temp 98.7°F | Resp 18 | Ht 63.0 in | Wt 169.0 lb

## 2014-12-25 DIAGNOSIS — J329 Chronic sinusitis, unspecified: Secondary | ICD-10-CM | POA: Diagnosis not present

## 2014-12-25 MED ORDER — PREDNISONE 20 MG PO TABS
ORAL_TABLET | ORAL | Status: DC
Start: 2014-12-25 — End: 2015-06-04

## 2014-12-25 MED ORDER — LEVOFLOXACIN 500 MG PO TABS: 500.0000 mg | ORAL_TABLET | Freq: Every day | ORAL | Status: DC

## 2014-12-25 NOTE — Progress Notes (Signed)
Subjective:    Patient ID: Kathryn Lozano, female    DOB: Sep 09, 1985, 29 y.o.   MRN: [id number]  HPI I reviewed her most recent office visits with my partner Dr. Buelah Manis. Patient continues to report a chronic cough that has been ongoing now for almost 6 weeks. It has been particularly worse the last 3 weeks. She also reports sinus pressure, postnasal drip, sinus headaches. Cough is productive of clear sputum. She reports a sore scratchy throat from the constant drainage. Last time she was given Augmentin and a one-time shot of Depo-Medrol which did not help. She's been using her Q nasal which has not helped. Past Medical History  Diagnosis Date  . Migraines   . ADHD (attention deficit hyperactivity disorder)   . Anxiety   . Renal disorder     KIDNEY STONES  . PONV (postoperative nausea and vomiting)     Pt reports nausea, "sensitive stomach"   Past Surgical History  Procedure Laterality Date  . Lithotripsy    . Wisdom tooth extraction    . Cholecystectomy N/A 09/14/2014    Procedure: LAPAROSCOPIC CHOLECYSTECTOMY;  Surgeon: Coralie Keens, MD;  Location: [facility];  Service: General;  Laterality: N/A;   Current Outpatient Prescriptions on File Prior to Visit  Medication Sig Dispense Refill  . ALPRAZolam (XANAX) 0.5 MG tablet Take 1 tablet (0.5 mg total) by mouth every 8 (eight) hours as needed for sleep. 30 tablet 0  . amoxicillin-clavulanate (AUGMENTIN) 875-125 MG per tablet Take 1 tablet by mouth 2 (two) times daily. 20 tablet 0  . Beclomethasone Dipropionate (QNASL) 80 MCG/ACT AERS 2 sprays each nose once a day as needed 1 Inhaler 3  . levonorgestrel (MIRENA) 20 MCG/24HR IUD 1 each by Intrauterine route.    . methylphenidate (CONCERTA) 18 MG PO CR tablet Take 1 tablet (18 mg total) by mouth daily after lunch. 30 tablet 0  . methylphenidate (CONCERTA) 36 MG PO CR tablet Take 1 tablet (36 mg total) by mouth daily after breakfast. 30 tablet 0  . oxyCODONE-acetaminophen (PERCOCET) 10-325  MG per tablet Take 1 tablet by mouth every 4 (four) hours as needed for pain. 40 tablet 0  . tamsulosin (FLOMAX) 0.4 MG CAPS capsule Take 1 capsule (0.4 mg total) by mouth daily. 30 capsule 0  . topiramate (TOPAMAX) 100 MG tablet Take 100 mg by mouth 2 (two) times daily.     No current facility-administered medications on file prior to visit.   No Known Allergies History   Social History  . Marital Status: Single    Spouse Name: N/A  . Number of Children: N/A  . Years of Education: N/A   Occupational History  . Not on file.   Social History Main Topics  . Smoking status: Never Smoker   . Smokeless tobacco: Never Used  . Alcohol Use: Yes     Comment: occasionally  . Drug Use: No  . Sexual Activity: Not on file   Other Topics Concern  . Not on file   Social History Narrative      Review of Systems  All other systems reviewed and are negative.      Objective:   Physical Exam  Constitutional: She appears well-developed and well-nourished.  HENT:  Nose: Mucosal edema and rhinorrhea present. Right sinus exhibits maxillary sinus tenderness. Left sinus exhibits maxillary sinus tenderness.  Neck: Neck supple.  Cardiovascular: Normal rate, regular rhythm and normal heart sounds.   Pulmonary/Chest: Breath sounds normal. No respiratory distress. She has  no wheezes. She has no rales. She exhibits no tenderness.  Lymphadenopathy:    She has no cervical adenopathy.  Vitals reviewed.         Assessment & Plan:  Chronic sinusitis, unspecified location - Plan: predniSONE (DELTASONE) 20 MG tablet, levofloxacin (LEVAQUIN) 500 MG tablet  I will treat the patient for chronic sinusitis with a prednisone taper pack and Levaquin 500 mg by mouth daily for 7 days. If symptoms are no better proceed with a CT scan of the sinuses. If there is no chronic sinusitis, consider an allergy referral

## 2014-12-27 ENCOUNTER — Ambulatory Visit (INDEPENDENT_AMBULATORY_CARE_PROVIDER_SITE_OTHER): Payer: 59 | Admitting: Internal Medicine

## 2014-12-27 ENCOUNTER — Ambulatory Visit (INDEPENDENT_AMBULATORY_CARE_PROVIDER_SITE_OTHER)
Admission: RE | Admit: 2014-12-27 | Discharge: 2014-12-27 | Disposition: A | Discharge status: Home / Self Care | Payer: 59 | Source: Ambulatory Visit | Attending: Internal Medicine | Admitting: Internal Medicine

## 2014-12-27 ENCOUNTER — Encounter: Payer: Self-pay | Admitting: Internal Medicine

## 2014-12-27 VITALS — BP 104/70 | HR 91 | Ht 65.0 in | Wt 170.0 lb

## 2014-12-27 DIAGNOSIS — R0982 Postnasal drip: Secondary | ICD-10-CM | POA: Diagnosis not present

## 2014-12-27 DIAGNOSIS — R053 Chronic cough: Secondary | ICD-10-CM

## 2014-12-27 DIAGNOSIS — R05 Cough: Secondary | ICD-10-CM

## 2014-12-27 NOTE — Patient Instructions (Addendum)
ICD-9-CM ICD-10-CM   1. Chronic cough 786.2 R05   2. Post-nasal drip 784.91 R09.82    Complete your prednisone in 3 days Please do SInus CT wo contrast Please do CXR 2 view today Please do methacholine challenge test 2 weeks from now Keep up allergy referral  Followup 2 weeks after test

## 2014-12-27 NOTE — Progress Notes (Signed)
Subjective:    Patient ID: Kathryn Lozano, female    DOB: Jul 23, 1985, 29 y.o.   MRN: [id number]  HPI  IOV 12/27/2014  Chief Complaint  Patient presents with  . Pulmonary Consult    Referred by Dr Buelah Manis for productive cough with clear mucus x 3 months. Currently on  levaquin  and prednisone    29 year old female referred for chronic cough. She is a paramedic.  She has lifelong history of chronic postnasal drainage for which she has not been evaluated. She reports insidious onset of chronic cough for the last 3 months. Unclear if she had a respirator infection at that time but she reckons not. Cough is persisted since then. Approximately 3 weeks ago she had a respirator infection and after this the cough got worse. Cough is constantly associate with postnasal drainage and a feeling of tickle in her throat. Cough is described as moderate to severe in the quality is dry. It is present both day and night. There is no associated shortness of breath or wheezing. Approximately a month ago she saw her primary care physician notice that she's had one severe episode of postviral reactive cough. Patient was given acid reflux and pretreatment which did not help. Then approximately several days ago she's been started on empiric prednisone with Levaquin which has helped significantly she's currently on day 3 out of 6 of prednisone. She does not want to stop the prednisone because it is helping immensely.  Cough relevant history - Hypertension and ACE inhibitor intake: Denies  - Allergies: She believes she might have spring pollen allergies. She has an appointment pending with Dr. Baird Lyons in September 2016. There is no family history of asthma. She is not a smoker  -Acid reflux: She denies. Empiric acid reflux therapy failed to control cough  - Sinus drainage: She constantly has a problem with this it is not been worked up  - Pulm hx: CXR 12/28/12 - personally visualized - clear  - Labs - normal  eos in blood 03/06/14    Dr Lorenza Cambridge Reflux Symptom Index (> 13-15 suggestive of LPR cough) 0 -> 5  =  none ->severe problem 12/27/2014   Hoarseness of problem with voice 0  Clearing  Of Throat 5  Excess throat mucus or feeling of post nasal drip 5  Difficulty swallowing food, liquid or tablets 3  Cough after eating or lying down 4  Breathing difficulties or choking episodes 3  Troublesome or annoying cough 5  Sensation of something sticking in throat or lump in throat 5  Heartburn, chest pain, indigestion, or stomach acid coming up 1  TOTAL 31       has a past medical history of Migraines; ADHD (attention deficit hyperactivity disorder); Anxiety; Renal disorder; and PONV (postoperative nausea and vomiting).   reports that she has never smoked. She has never used smokeless tobacco.  Past Surgical History  Procedure Laterality Date  . Lithotripsy    . Wisdom tooth extraction    . Cholecystectomy N/A 09/14/2014    Procedure: LAPAROSCOPIC CHOLECYSTECTOMY;  Surgeon: Coralie Keens, MD;  Location: [facility];  Service: General;  Laterality: N/A;    No Known Allergies  Immunization History  Administered Date(s) Administered  . HPV Quadrivalent 02/01/2007, 04/08/2007, 08/05/2007  . Influenza-Unspecified 04/27/2014    No family history on file.   Current outpatient prescriptions:  .  ALPRAZolam (XANAX) 0.5 MG tablet, Take 1 tablet (0.5 mg total) by mouth every 8 (eight) hours as needed for  sleep., Disp: 30 tablet, Rfl: 0 .  levofloxacin (LEVAQUIN) 500 MG tablet, Take 1 tablet (500 mg total) by mouth daily., Disp: 7 tablet, Rfl: 0 .  levonorgestrel (MIRENA) 20 MCG/24HR IUD, 1 each by Intrauterine route., Disp: , Rfl:  .  methylphenidate (CONCERTA) 18 MG PO CR tablet, Take 1 tablet (18 mg total) by mouth daily after lunch., Disp: 30 tablet, Rfl: 0 .  methylphenidate (CONCERTA) 36 MG PO CR tablet, Take 1 tablet (36 mg total) by mouth daily after breakfast., Disp: 30 tablet, Rfl: 0 .   predniSONE (DELTASONE) 20 MG tablet, 3 tabs poqday 1-2, 2 tabs poqday 3-4, 1 tab poqday 5-6, Disp: 12 tablet, Rfl: 0 .  topiramate (TOPAMAX) 100 MG tablet, Take 100 mg by mouth 2 (two) times daily., Disp: , Rfl:  .  amoxicillin-clavulanate (AUGMENTIN) 875-125 MG per tablet, Take 1 tablet by mouth 2 (two) times daily. (Patient not taking: Reported on 12/27/2014), Disp: 20 tablet, Rfl: 0 .  Beclomethasone Dipropionate (QNASL) 80 MCG/ACT AERS, 2 sprays each nose once a day as needed (Patient not taking: Reported on 12/27/2014), Disp: 1 Inhaler, Rfl: 3 .  oxyCODONE-acetaminophen (PERCOCET) 10-325 MG per tablet, Take 1 tablet by mouth every 4 (four) hours as needed for pain. (Patient not taking: Reported on 12/27/2014), Disp: 40 tablet, Rfl: 0 .  tamsulosin (FLOMAX) 0.4 MG CAPS capsule, Take 1 capsule (0.4 mg total) by mouth daily. (Patient not taking: Reported on 12/27/2014), Disp: 30 capsule, Rfl: 0     Review of Systems  Constitutional: Negative for fever, chills and unexpected weight change.  HENT: Negative for congestion, dental problem, ear pain, nosebleeds, postnasal drip, rhinorrhea, sinus pressure, sneezing, sore throat, trouble swallowing and voice change.   Eyes: Negative for visual disturbance.  Respiratory: Positive for cough. Negative for choking and shortness of breath.   Cardiovascular: Negative for chest pain and leg swelling.  Gastrointestinal: Negative for vomiting, abdominal pain and diarrhea.  Genitourinary: Negative for difficulty urinating.  Musculoskeletal: Negative for arthralgias.  Skin: Negative for rash.  Neurological: Negative for tremors, syncope and headaches.  Hematological: Does not bruise/bleed easily.       Objective:   Physical Exam  Constitutional: She is oriented to person, place, and time. She appears well-developed and well-nourished. No distress.  HENT:  Head: Normocephalic and atraumatic.  Right Ear: External ear normal.  Left Ear: External ear normal.    Mouth/Throat: Oropharynx is clear and moist. No oropharyngeal exudate.  Constantly sniffing her nose Postnasal drip present  Eyes: Conjunctivae and EOM are normal. Pupils are equal, round, and reactive to light. Right eye exhibits no discharge. Left eye exhibits no discharge. No scleral icterus.  Neck: Normal range of motion. Neck supple. No JVD present. No tracheal deviation present. No thyromegaly present.  Cardiovascular: Normal rate, regular rhythm, normal heart sounds and intact distal pulses.  Exam reveals no gallop and no friction rub.   No murmur heard. Pulmonary/Chest: Effort normal and breath sounds normal. No respiratory distress. She has no wheezes. She has no rales. She exhibits no tenderness.  Abdominal: Soft. Bowel sounds are normal. She exhibits no distension and no mass. There is no tenderness. There is no rebound and no guarding.  Musculoskeletal: Normal range of motion. She exhibits no edema or tenderness.  Lymphadenopathy:    She has no cervical adenopathy.  Neurological: She is alert and oriented to person, place, and time. She has normal reflexes. No cranial nerve deficit. She exhibits normal muscle tone. Coordination normal.  Skin:  Skin is warm and dry. No rash noted. She is not diaphoretic. No erythema. No pallor.  Psychiatric: She has a normal mood and affect. Her behavior is normal. Judgment and thought content normal.  Vitals reviewed.   Filed Vitals:   12/27/14 1155  BP: 104/70  Pulse: 91  Height: 5\' 5"  (1.651 m)  Weight: 170 lb (77.111 kg)  SpO2: 99%          Assessment & Plan:     ICD-9-CM ICD-10-CM   1. Chronic cough 786.2 R05 CT Maxillofacial LTD WO CM     DG Chest 2 View     Pulmonary Function Test  2. Post-nasal drip 784.91 R09.82 CT Maxillofacial LTD WO CM     Pulmonary Function Test    She has cough hpersensitivity syndrome(aka irritable larynx syndrome, cyclical cough) due to neural irritation in upper airway. Made worse by recent resp  infection and perhaps kept alive by sinus issues. She might have undiagnosed asthma / allergies given some relief with prednisone. Whthere she has ass GERD is unknown but PPI failed to control   PLAN Complete your prednisone in 3 days Please do SInus CT wo contrast -> depending on results refer ENT Please do CXR 2 view today Please do methacholine challenge test 2 weeks from now Keep up allergy appt  Followup 2 weeks after test   Dr. Brand Males, M.D., [facility].C.P Pulmonary and Critical Care Medicine Staff Physician [facility] Pager: [phone], If no answer or between  15:00h - 7:00h: call [phone]  01/04/2015 10:27 PM

## 2014-12-29 ENCOUNTER — Telehealth: Payer: Self-pay | Admitting: Internal Medicine

## 2014-12-29 NOTE — Telephone Encounter (Signed)
cxr is normal 12/27/14. Sorry for delay

## 2014-12-29 NOTE — Telephone Encounter (Signed)
I spoke with patient about results and she verbalized understanding and had no questions 

## 2014-12-29 NOTE — Telephone Encounter (Signed)
Spoke with pt, requesting cxr results from Wednesday.   MR please advise on cxr results.  Thanks!

## 2015-01-02 ENCOUNTER — Ambulatory Visit (INDEPENDENT_AMBULATORY_CARE_PROVIDER_SITE_OTHER)
Admission: RE | Admit: 2015-01-02 | Discharge: 2015-01-02 | Disposition: A | Discharge status: Home / Self Care | Payer: 59 | Source: Ambulatory Visit | Attending: Internal Medicine | Admitting: Internal Medicine

## 2015-01-02 ENCOUNTER — Other Ambulatory Visit: Payer: 59

## 2015-01-02 DIAGNOSIS — R05 Cough: Secondary | ICD-10-CM | POA: Diagnosis not present

## 2015-01-02 DIAGNOSIS — R0982 Postnasal drip: Secondary | ICD-10-CM | POA: Diagnosis not present

## 2015-01-02 DIAGNOSIS — R053 Chronic cough: Secondary | ICD-10-CM

## 2015-01-03 ENCOUNTER — Telehealth: Payer: Self-pay | Admitting: Internal Medicine

## 2015-01-03 DIAGNOSIS — R0982 Postnasal drip: Secondary | ICD-10-CM

## 2015-01-03 NOTE — Telephone Encounter (Signed)
CT sinus - normal excvept for deviated septum  Plan Do MCT test in July and keep fu with TP in july  [place] [facility]  01/02/2015   CLINICAL DATA:  Chronic productive cough since March. Sinus pressure and drainage. Recent antibiotics and prednisone.  EXAM: CT PARANASAL SINUS LIMITED WITHOUT CONTRAST  TECHNIQUE: Non-contiguous multidetector CT images of the paranasal sinuses were obtained in a single plane without contrast.  COMPARISON:  CT head without contrast 05/15/2006.  FINDINGS: Leftward deviation of the nasal septum measures up to 4.2 mm. A concha bullosa of the right middle turbinate measures 8 mm. Screening examination of the paranasal sinuses demonstrates no focal mucosal disease or fluid level. Limited imaging of the brain is unremarkable.  IMPRESSION: 1. No significant sinus disease. 2. Leftward nasal septal deviation.   Electronically Signed   By: San Morelle M.D.   On: 01/02/2015 17:03      Dr. Brand Males, M.D., F.C.C.P Pulmonary and Critical Care Medicine Staff Physician [facility] Pager: [phone], If no answer or between  15:00h - 7:00h: call [phone]  01/03/2015 8:10 PM

## 2015-01-04 MED ORDER — FLUTICASONE PROPIONATE 50 MCG/ACT NA SUSP: 2.0000 | Freq: Every day | NASAL | Status: DC

## 2015-01-04 NOTE — Telephone Encounter (Signed)
[phone], pt cb

## 2015-01-04 NOTE — Telephone Encounter (Signed)
Pt aware of results/recs.  Verified mct date/time with pt.  Pt is requesting recs for her symptoms in the meantime- states her PND and mucus is so thick she is gagging on this.  She recently came off a pred taper given by another provider.  She is requesting recs for this.  MR please advise.  Thanks!

## 2015-01-04 NOTE — Telephone Encounter (Signed)
lmtcb for pt.  

## 2015-01-04 NOTE — Telephone Encounter (Signed)
1. I do not want to do po prednisone till she finished methacholine challenge  2. For sinus/gag  - recommended netti pot at night using bottled water  -  take generic fluticasone inhaler 2 squirts each nostril daily - refer ENT  3. Keep the MCT appt and fu with TP

## 2015-01-04 NOTE — Telephone Encounter (Signed)
Pt aware of below recs.  Med and referral sent.  Nothing further needed

## 2015-01-10 ENCOUNTER — Ambulatory Visit (HOSPITAL_COMMUNITY)
Admission: RE | Admit: 2015-01-10 | Discharge: 2015-01-10 | Disposition: A | Discharge status: Home / Self Care | Payer: 59 | Source: Ambulatory Visit | Attending: Internal Medicine | Admitting: Internal Medicine

## 2015-01-10 DIAGNOSIS — R053 Chronic cough: Secondary | ICD-10-CM

## 2015-01-10 DIAGNOSIS — R05 Cough: Secondary | ICD-10-CM | POA: Insufficient documentation

## 2015-01-10 DIAGNOSIS — R0982 Postnasal drip: Secondary | ICD-10-CM | POA: Diagnosis not present

## 2015-01-10 LAB — PULMONARY FUNCTION TEST
FEF 25-75 Post: 4.14 L/sec
FEF 25-75 Pre: 3.92 L/sec
FEF2575-%Change-Post: 5 %
FEF2575-%Pred-Post: 110 %
FEF2575-%Pred-Pre: 104 %
FEV1-%CHANGE-POST: 2 %
FEV1-%Pred-Post: 103 %
FEV1-%Pred-Pre: 100 %
FEV1-Post: 3.73 L
FEV1-Pre: 3.63 L
FEV1FVC-%Change-Post: 2 %
FEV1FVC-%Pred-Pre: 97 %
FEV6-%Change-Post: -1 %
FEV6-%Pred-Post: 101 %
FEV6-%Pred-Pre: 103 %
FEV6-POST: 4.31 L
FEV6-PRE: 4.39 L
FEV6FVC-%Change-Post: 0 %
FEV6FVC-%PRED-PRE: 101 %
FEV6FVC-%Pred-Post: 100 %
FVC-%Change-Post: 0 %
FVC-%PRED-PRE: 102 %
FVC-%Pred-Post: 102 %
FVC-POST: 4.4 L
FVC-Pre: 4.39 L
POST FEV6/FVC RATIO: 100 %
Post FEV1/FVC ratio: 85 %
Pre FEV1/FVC ratio: 83 %
Pre FEV6/FVC Ratio: 100 %

## 2015-01-10 MED ORDER — METHACHOLINE 4 MG/ML NEB SOLN
2.0000 mL | Freq: Once | RESPIRATORY_TRACT | Status: AC
  Administered 2015-01-10: 8 mg via RESPIRATORY_TRACT

## 2015-01-10 MED ORDER — SODIUM CHLORIDE 0.9 % IN NEBU
3.0000 mL | INHALATION_SOLUTION | Freq: Once | RESPIRATORY_TRACT | Status: AC
  Administered 2015-01-10: 3 mL via RESPIRATORY_TRACT

## 2015-01-10 MED ORDER — METHACHOLINE 0.25 MG/ML NEB SOLN
2.0000 mL | Freq: Once | RESPIRATORY_TRACT | Status: AC
  Administered 2015-01-10: 0.5 mg via RESPIRATORY_TRACT

## 2015-01-10 MED ORDER — METHACHOLINE 0.0625 MG/ML NEB SOLN
2.0000 mL | Freq: Once | RESPIRATORY_TRACT | Status: AC
  Administered 2015-01-10: 0.125 mg via RESPIRATORY_TRACT

## 2015-01-10 MED ORDER — METHACHOLINE 16 MG/ML NEB SOLN
2.0000 mL | Freq: Once | RESPIRATORY_TRACT | Status: AC
  Administered 2015-01-10: 32 mg via RESPIRATORY_TRACT

## 2015-01-10 MED ORDER — METHACHOLINE 1 MG/ML NEB SOLN
2.0000 mL | Freq: Once | RESPIRATORY_TRACT | Status: AC
  Administered 2015-01-10: 2 mg via RESPIRATORY_TRACT

## 2015-01-10 MED ORDER — ALBUTEROL SULFATE (2.5 MG/3ML) 0.083% IN NEBU
2.5000 mg | INHALATION_SOLUTION | Freq: Once | RESPIRATORY_TRACT | Status: AC
  Administered 2015-01-10: 2.5 mg via RESPIRATORY_TRACT

## 2015-01-22 ENCOUNTER — Other Ambulatory Visit: Payer: Self-pay | Admitting: Family Medicine

## 2015-01-25 ENCOUNTER — Ambulatory Visit: Payer: 59 | Admitting: Adult Health

## 2015-03-28 ENCOUNTER — Institutional Professional Consult (permissible substitution): Payer: 59 | Admitting: Internal Medicine

## 2015-04-23 ENCOUNTER — Other Ambulatory Visit: Payer: Self-pay | Admitting: Family Medicine

## 2015-04-23 MED ORDER — TOPIRAMATE 100 MG PO TABS: 100.0000 mg | ORAL_TABLET | Freq: Two times a day (BID) | ORAL | Status: DC

## 2015-04-23 NOTE — Telephone Encounter (Signed)
Correct dosage sent to pharmacy.

## 2015-04-25 ENCOUNTER — Ambulatory Visit: Payer: 59 | Admitting: Allergy and Immunology

## 2015-05-09 ENCOUNTER — Ambulatory Visit: Payer: 59 | Admitting: Allergy and Immunology

## 2015-05-25 ENCOUNTER — Other Ambulatory Visit: Payer: Self-pay | Admitting: Neurology

## 2015-05-25 MED ORDER — RANITIDINE HCL 150 MG PO TABS: 150.0000 mg | ORAL_TABLET | Freq: Two times a day (BID) | ORAL | Status: DC

## 2015-05-25 MED ORDER — MONTELUKAST SODIUM 10 MG PO TABS: 10.0000 mg | ORAL_TABLET | Freq: Every day | ORAL | Status: DC

## 2015-06-04 ENCOUNTER — Ambulatory Visit (INDEPENDENT_AMBULATORY_CARE_PROVIDER_SITE_OTHER): Payer: 59 | Admitting: Family Medicine

## 2015-06-04 VITALS — BP 118/64 | HR 80 | Temp 98.2°F | Wt 162.0 lb

## 2015-06-04 DIAGNOSIS — J329 Chronic sinusitis, unspecified: Secondary | ICD-10-CM | POA: Diagnosis not present

## 2015-06-04 DIAGNOSIS — K219 Gastro-esophageal reflux disease without esophagitis: Secondary | ICD-10-CM | POA: Diagnosis not present

## 2015-06-04 MED ORDER — METHYLPHENIDATE HCL ER (OSM) 36 MG PO TBCR: 36.0000 mg | EXTENDED_RELEASE_TABLET | Freq: Every day | ORAL | Status: DC

## 2015-06-04 MED ORDER — PREDNISONE 20 MG PO TABS: ORAL_TABLET | ORAL | Status: DC

## 2015-06-04 MED ORDER — OMEPRAZOLE 40 MG PO CPDR: 40.0000 mg | DELAYED_RELEASE_CAPSULE | Freq: Every day | ORAL | Status: DC

## 2015-06-04 NOTE — Progress Notes (Signed)
Subjective:    Patient ID: Kathryn Lozano, female    DOB: May 21, 1986, 29 y.o.   MRN: [id number]  HPI Patient reports 2 weeks of nasal congestion, sinus pressure, rhinorrhea, postnasal drip. She also reports sneezing and head congestion. She has a history of severe allergies. She denies any sinus pain. She denies any dental pain. She denies any fevers or chills. She is currently using a combination of Zantac and omeprazole for recalcitrant reflux disease that causes her chronic cough. This is been successfully managed by her allergist as well as an ENT physician using a combination of Zantac omeprazole and Singulair. She is asking for refill today on omeprazole. She also is asking for refill today on her Concerta. She is 36 mg every day in the morning to help her focus and to manage her ADD. She does well on this medication. 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  On the medication she is not easily distracted. She does not forget job responsibilities. She does not make as many careless errors. She denies any insomnia or weight loss or palpitations on the medication Past Medical History  Diagnosis Date  . Migraines   . ADHD (attention deficit hyperactivity disorder)   . Anxiety   . Renal disorder     KIDNEY STONES  . PONV (postoperative nausea and vomiting)     Pt reports nausea, "sensitive stomach"   Past Surgical History  Procedure Laterality Date  . Lithotripsy    . Wisdom tooth extraction    . Cholecystectomy N/A 09/14/2014    Procedure: LAPAROSCOPIC CHOLECYSTECTOMY;  Surgeon: Coralie Keens, MD;  Location: [facility];  Service: General;  Laterality: N/A;   Current Outpatient Prescriptions on File Prior to Visit  Medication Sig Dispense Refill  . ALPRAZolam (XANAX) 0.5 MG tablet Take 1 tablet (0.5 mg total) by mouth every 8 (eight) hours as needed for sleep. 30 tablet 0  . fluticasone (FLONASE) 50 MCG/ACT nasal spray Place 2 sprays into both nostrils daily. 16 g 2  .  levonorgestrel (MIRENA) 20 MCG/24HR IUD 1 each by Intrauterine route.    . methylphenidate (CONCERTA) 18 MG PO CR tablet Take 1 tablet (18 mg total) by mouth daily after lunch. 30 tablet 0  . montelukast (SINGULAIR) 10 MG tablet Take 1 tablet (10 mg total) by mouth at bedtime. 90 tablet 1  . ranitidine (ZANTAC) 150 MG tablet Take 1 tablet (150 mg total) by mouth 2 (two) times daily. 90 tablet 1  . topiramate (TOPAMAX) 100 MG tablet Take 1 tablet (100 mg total) by mouth 2 (two) times daily. 60 tablet 3   No current facility-administered medications on file prior to visit.   No Known Allergies Social History   Social History  . Marital Status: Single    Spouse Name: N/A  . Number of Children: N/A  . Years of Education: N/A   Occupational History  . Not on file.   Social History Main Topics  . Smoking status: Never Smoker   . Smokeless tobacco: Never Used  . Alcohol Use: Yes     Comment: occasionally  . Drug Use: No  . Sexual Activity: Not on file   Other Topics Concern  . Not on file   Social History Narrative      Review of Systems  All other systems reviewed and are negative.      Objective:   Physical Exam  Constitutional: She appears well-developed and well-nourished.  HENT:  Right Ear:  Tympanic membrane and ear canal normal.  Left Ear: Tympanic membrane and ear canal normal.  Nose: Mucosal edema and rhinorrhea present. Right sinus exhibits no maxillary sinus tenderness and no frontal sinus tenderness. Left sinus exhibits no maxillary sinus tenderness and no frontal sinus tenderness.  Cardiovascular: Normal rate, regular rhythm, normal heart sounds and intact distal pulses.   No murmur heard. Pulmonary/Chest: Effort normal and breath sounds normal. No respiratory distress. She has no wheezes. She has no rales. She exhibits no tenderness.  Abdominal: Soft. Bowel sounds are normal. She exhibits no distension. There is no tenderness. There is no rebound and no  guarding.  Vitals reviewed.         Assessment & Plan:  Chronic sinusitis, unspecified location - Plan: predniSONE (DELTASONE) 20 MG tablet  Gastroesophageal reflux disease without esophagitis - Plan: omeprazole (PRILOSEC) 40 MG capsule  I believe the patient has acute on chronic underlying allergic rhinosinusitis. She is currently on Flonase, Singulair, over-the-counter antihistamines. However her symptoms still persist. Therefore I'll give her a short taper pack of prednisone. Consider antibiotics if the patient develops pain or fever. I will refill her omeprazole. Also refilled her ADD medication Concerta 36 mg by mouth every morning. I gave her 30 tablets and 2 refills and postdated the refills. Recheck in 6 months or sooner if symptoms arise

## 2015-06-08 ENCOUNTER — Other Ambulatory Visit: Payer: Self-pay | Admitting: Family Medicine

## 2015-06-08 ENCOUNTER — Telehealth: Payer: Self-pay | Admitting: Family Medicine

## 2015-06-08 MED ORDER — AMOXICILLIN 875 MG PO TABS: 875.0000 mg | ORAL_TABLET | Freq: Two times a day (BID) | ORAL | Status: DC

## 2015-06-08 NOTE — Telephone Encounter (Signed)
I sent in amoxicillin 875 bid for 10 days

## 2015-06-08 NOTE — Telephone Encounter (Signed)
Dr. Dennard Schaumann,  Pt called and states that she is not feeling any better and she is still having significant vertigo. She does not feel that the Prednisone is working and would like to know if we could call her in ABX.  She uses the [facility] in [place]. You may reach her @ [phone]

## 2015-06-12 ENCOUNTER — Emergency Department (HOSPITAL_COMMUNITY)
Admission: EM | Admit: 2015-06-12 | Discharge: 2015-06-13 | Disposition: A | Discharge status: Home / Self Care | Payer: 59 | Attending: Emergency Medicine | Admitting: Emergency Medicine

## 2015-06-12 DIAGNOSIS — Z79899 Other long term (current) drug therapy: Secondary | ICD-10-CM | POA: Diagnosis not present

## 2015-06-12 DIAGNOSIS — R42 Dizziness and giddiness: Secondary | ICD-10-CM | POA: Insufficient documentation

## 2015-06-12 DIAGNOSIS — Z7951 Long term (current) use of inhaled steroids: Secondary | ICD-10-CM | POA: Diagnosis not present

## 2015-06-12 DIAGNOSIS — K219 Gastro-esophageal reflux disease without esophagitis: Secondary | ICD-10-CM | POA: Insufficient documentation

## 2015-06-12 DIAGNOSIS — J0140 Acute pansinusitis, unspecified: Secondary | ICD-10-CM

## 2015-06-12 DIAGNOSIS — F419 Anxiety disorder, unspecified: Secondary | ICD-10-CM | POA: Diagnosis not present

## 2015-06-12 DIAGNOSIS — Z87442 Personal history of urinary calculi: Secondary | ICD-10-CM | POA: Insufficient documentation

## 2015-06-12 DIAGNOSIS — Z792 Long term (current) use of antibiotics: Secondary | ICD-10-CM | POA: Insufficient documentation

## 2015-06-12 DIAGNOSIS — Z793 Long term (current) use of hormonal contraceptives: Secondary | ICD-10-CM | POA: Diagnosis not present

## 2015-06-12 DIAGNOSIS — F909 Attention-deficit hyperactivity disorder, unspecified type: Secondary | ICD-10-CM | POA: Insufficient documentation

## 2015-06-12 DIAGNOSIS — G43909 Migraine, unspecified, not intractable, without status migrainosus: Secondary | ICD-10-CM | POA: Insufficient documentation

## 2015-06-12 DIAGNOSIS — R05 Cough: Secondary | ICD-10-CM | POA: Diagnosis present

## 2015-06-13 ENCOUNTER — Encounter (HOSPITAL_COMMUNITY): Payer: Self-pay | Admitting: Family Medicine

## 2015-06-13 ENCOUNTER — Emergency Department (HOSPITAL_COMMUNITY): Payer: 59

## 2015-06-13 MED ORDER — SALINE SPRAY 0.65 % NA SOLN
1.0000 | NASAL | Status: DC | PRN
  Administered 2015-06-13: 1 via NASAL
  Filled 2015-06-13: qty 44

## 2015-06-13 NOTE — Discharge Instructions (Signed)
Sinus Rinse WHAT IS A SINUS RINSE? A sinus rinse is a home treatment. It rinses your sinuses with a mixture of salt and water (saline solution). Sinuses are air-filled spaces in your skull behind the bones of your face and forehead. They open into your nasal cavity. To do a sinus rinse, you will need:  Saline solution.  Neti pot or spray bottle. This releases the saline solution into your nose and through your sinuses. You can buy neti pots and spray bottles at:  Your local pharmacy.  A health food store.  Online. WHEN WOULD I DO A SINUS RINSE?  A sinus rinse can help to clear your nasal cavity. It can clear:   Mucus.  Dirt.  Dust.  Pollen. You may do a sinus rinse when you have:  A cold.  A virus.  Allergies.  A sinus infection.  A stuffy nose. If you are considering a sinus rinse:  Ask your child's doctor before doing a sinus rinse on your child.  Do not do a sinus rinse if you have had:  Ear or nasal surgery.  An ear infection.  Blocked ears. HOW DO I DO A SINUS RINSE?   Wash your hands.  Disinfect your device using the directions that came with the device.  Dry your device.  Use the solution that comes with your device or one that is sold separately in stores. Follow the mixing directions on the package.  Fill your device with the amount of saline solution as stated in the device instructions.  Stand over a sink and tilt your head sideways over the sink.  Place the spout of the device in your upper nostril (the one closer to the ceiling).  Gently pour or squeeze the saline solution into the nasal cavity. The liquid should drain to the lower nostril if you are not too congested.  Gently blow your nose. Blowing too hard may cause ear pain.  Repeat in the other nostril.  Clean and rinse your device with clean water.  Air-dry your device. ARE THERE RISKS OF A SINUS RINSE?  Sinus rinse is normally very safe and helpful. However, there are a few  risks, which include:   A burning feeling in the sinuses. This may happen if you do not make the saline solution as instructed. Make sure to follow all directions when making the saline solution.  Infection from unclean water. This is rare, but possible.  Nasal irritation.   This information is not intended to replace advice given to you by your health care provider. Make sure you discuss any questions you have with your health care provider.   Document Released: 01/18/2014 Document Reviewed: 01/18/2014 Elsevier Interactive Patient Education 2016 [employer].  Sinusitis, Adult Sinusitis is redness, soreness, and puffiness (inflammation) of the air pockets in the bones of your face (sinuses). The redness, soreness, and puffiness can cause air and mucus to get trapped in your sinuses. This can allow germs to grow and cause an infection.  HOME CARE   Drink enough fluids to keep your pee (urine) clear or pale yellow.  Use a humidifier in your home.  Run a hot shower to create steam in the bathroom. Sit in the bathroom with the door closed. Breathe in the steam 3-4 times a day.  Put a warm, moist washcloth on your face 3-4 times a day, or as told by your doctor.  Use salt water sprays (saline sprays) to wet the thick fluid in your nose. This can  help the sinuses drain.  Only take medicine as told by your doctor. GET HELP RIGHT AWAY IF:   Your pain gets worse.  You have very bad headaches.  You are sick to your stomach (nauseous).  You throw up (vomit).  You are very sleepy (drowsy) all the time.  Your face is puffy (swollen).  Your vision changes.  You have a stiff neck.  You have trouble breathing. MAKE SURE YOU:   Understand these instructions.  Will watch your condition.  Will get help right away if you are not doing well or get worse.   This information is not intended to replace advice given to you by your health care provider. Make sure you discuss any  questions you have with your health care provider.   Document Released: 12/10/2007 Document Revised: 07/14/2014 Document Reviewed: 01/27/2012 Elsevier Interactive Patient Education 2016 [place] taking her antibiotic as directed by your PCP,  continue using her Nasacort, prednisone nasal inhaler.  You can also use the sinus Ocean nasal spray or Nettie pot. And  Warm compresses. You have been given a referral to ENT for further evaluation of your recurrent sinusitis

## 2015-06-13 NOTE — ED Provider Notes (Signed)
CSN: [id number]     Arrival date & time 06/12/15  2355 History   None    Chief Complaint  Patient presents with  . Dizziness  . Cough  . Headache     (Consider location/radiation/quality/duration/timing/severity/associated sxs/prior Treatment) HPI Comments: This 29 year old female with several medical issues, most recently a sinus infection.  She was put on a course of steroids by mouth by her PCP, thinking this was allergy.  This did not help.  3 days ago.  She was started on amoxicillin for a sinus infection.  She is taking this as well as Rhinocort nasal spray without significant decrease in her discomfort.  She also has a history of migraine headaches but states that they are under control, as well as GERD that is under control.  She presents with worsening headache, sinus pressure and dizziness.  Patient is a 29 y.o. female presenting with dizziness, cough, and headaches.  Dizziness Quality:  Vertigo Severity:  Mild Onset quality:  Gradual Timing:  Intermittent Progression:  Unchanged Chronicity:  New Context: physical activity   Relieved by:  Nothing Worsened by:  Standing up Ineffective treatments:  None tried Associated symptoms: headaches   Associated symptoms: no chest pain, no nausea, no shortness of breath and no vision changes   Cough Associated symptoms: headaches and rhinorrhea   Associated symptoms: no chest pain, no shortness of breath and no wheezing   Headache Associated symptoms: congestion, cough, dizziness and sinus pressure   Associated symptoms: no eye pain, no nausea, no photophobia and no visual change     Past Medical History  Diagnosis Date  . Migraines   . ADHD (attention deficit hyperactivity disorder)   . Anxiety   . Renal disorder     KIDNEY STONES  . PONV (postoperative nausea and vomiting)     Pt reports nausea, "sensitive stomach"   Past Surgical History  Procedure Laterality Date  . Lithotripsy    . Wisdom tooth extraction    .  Cholecystectomy N/A 09/14/2014    Procedure: LAPAROSCOPIC CHOLECYSTECTOMY;  Surgeon: Coralie Keens, MD;  Location: [place];  Service: General;  Laterality: N/A;   History reviewed. No pertinent family history. Social History  Substance Use Topics  . Smoking status: Never Smoker   . Smokeless tobacco: Never Used  . Alcohol Use: Yes     Comment: Once every 6 months.    OB History    No data available     Review of Systems  HENT: Positive for congestion, rhinorrhea and sinus pressure.   Eyes: Negative for photophobia, pain and visual disturbance.  Respiratory: Positive for cough. Negative for shortness of breath, wheezing and stridor.   Cardiovascular: Negative for chest pain.  Gastrointestinal: Negative for nausea.  Neurological: Positive for dizziness and headaches.      Allergies  Review of patient's allergies indicates no known allergies.  Home Medications   Prior to Admission medications   Medication Sig Start Date End Date Taking? Authorizing Provider  amoxicillin (AMOXIL) 875 MG tablet Take 1 tablet (875 mg total) by mouth 2 (two) times daily. 06/08/15  Yes Susy Frizzle, MD  budesonide (RHINOCORT ALLERGY) 32 MCG/ACT nasal spray Place 2 sprays into both nostrils daily.   Yes Historical Provider, MD  Doxylamine-Phenylephrine-APAP (VICKS SINEX DAYTIME/NIGHTTIME PO) Take 1 capsule by mouth daily.   Yes Historical Provider, MD  levonorgestrel (MIRENA) 20 MCG/24HR IUD 1 each by Intrauterine route.   Yes Historical Provider, MD  methylphenidate (CONCERTA) 36 MG PO CR tablet  Take 1 tablet (36 mg total) by mouth daily after breakfast. 06/04/15  Yes Susy Frizzle, MD  montelukast (SINGULAIR) 10 MG tablet Take 1 tablet (10 mg total) by mouth at bedtime. 05/25/15  Yes Jiles Prows, MD  omeprazole (PRILOSEC) 40 MG capsule Take 1 capsule (40 mg total) by mouth daily. 06/04/15  Yes Susy Frizzle, MD  ranitidine (ZANTAC) 150 MG tablet Take 1 tablet (150 mg total) by mouth 2  (two) times daily. 05/25/15  Yes Jiles Prows, MD  topiramate (TOPAMAX) 100 MG tablet Take 1 tablet (100 mg total) by mouth 2 (two) times daily. 04/23/15  Yes Susy Frizzle, MD  ALPRAZolam Duanne Moron) 0.5 MG tablet Take 1 tablet (0.5 mg total) by mouth every 8 (eight) hours as needed for sleep. Patient not taking: Reported on 06/13/2015 12/05/14   Alycia Rossetti, MD  fluticasone [facility]) 50 MCG/ACT nasal spray Place 2 sprays into both nostrils daily. Patient not taking: Reported on 06/13/2015 01/04/15   Brand Males, MD  methylphenidate (CONCERTA) 18 MG PO CR tablet Take 1 tablet (18 mg total) by mouth daily after lunch. Patient not taking: Reported on 06/13/2015 11/27/14   Susy Frizzle, MD  predniSONE (DELTASONE) 20 MG tablet 3 tabs poqday 1-2, 2 tabs poqday 3-4, 1 tab poqday 5-6 Patient not taking: Reported on 06/13/2015 06/04/15   Susy Frizzle, MD   BP 139/96 mmHg  Pulse 97  Temp(Src) 98 F (36.7 C) (Oral)  Resp 18  Ht 5\' 3"  (1.6 m)  Wt 72.576 kg  BMI 28.35 kg/m2  SpO2 100% Physical Exam  Constitutional: She appears well-developed and well-nourished.  HENT:  Head: Normocephalic.  Nose: Right sinus exhibits maxillary sinus tenderness and frontal sinus tenderness. Left sinus exhibits maxillary sinus tenderness and frontal sinus tenderness.  Eyes: Pupils are equal, round, and reactive to light.  Neck: Normal range of motion.  Cardiovascular: Normal rate and regular rhythm.   Pulmonary/Chest: Effort normal. She exhibits no tenderness.  Abdominal: Soft.  Musculoskeletal: Normal range of motion.  Neurological: She is alert.  Skin: Skin is warm.  Nursing note and vitals reviewed.   ED Course  Procedures (including critical care time) Labs Review Labs Reviewed - No data to display  Imaging Review Dg Chest 2 View  06/13/2015  CLINICAL DATA:  Acute onset of productive cough. Dizziness. Initial encounter. EXAM: CHEST  2 VIEW COMPARISON:  Chest radiograph performed  12/27/2014 FINDINGS: The lungs are well-aerated and clear. There is no evidence of focal opacification, pleural effusion or pneumothorax. The heart is normal in size; the mediastinal contour is within normal limits. No acute osseous abnormalities are seen. Clips are noted within the right upper quadrant, reflecting prior cholecystectomy. IMPRESSION: No acute cardiopulmonary process seen. Electronically Signed   By: Garald Balding M.D.   On: 06/13/2015 03:30   I have personally reviewed and evaluated these images and lab results as part of my medical decision-making.   EKG Interpretation None     She just finished a course of steroids by mouth.  She is also using steroid nasal spray as well as taking amoxicillin for sinus infection at this point, I do not feel that there is much we can add as far as medication to her therapy.  I recommend that she use a saline nasal spray/irrigation and warm compresses. MDM   Final diagnoses:  Acute pansinusitis, recurrence not specified         Junius Creamer, NP 06/13/15 [place],  NP 06/13/15 [place], NP 06/13/15 [place], DO 06/13/15 [id number]

## 2015-06-13 NOTE — ED Notes (Signed)
Patient is complaining of constant dizziness, productive cough, and sinus headache. Pt has seen her PCP the sinus complaints. Pt has took Prednisone and day 4 of Amoxicillin for sinus infection. Also, had diarrhea.

## 2015-06-13 NOTE — ED Notes (Signed)
NP aware of results. Pending dispo. Pt asleep.

## 2015-07-28 ENCOUNTER — Other Ambulatory Visit: Payer: Self-pay | Admitting: Allergy and Immunology

## 2015-08-08 ENCOUNTER — Telehealth: Payer: Self-pay | Admitting: Family Medicine

## 2015-08-08 NOTE — Telephone Encounter (Signed)
LRF fill date 01/26/15.  LOV 06/04/15  OK refill?

## 2015-08-08 NOTE — Telephone Encounter (Signed)
PATIENT CALLING TO GET RX FOR HER CONCERTA  [id number]

## 2015-08-09 ENCOUNTER — Encounter: Payer: Self-pay | Admitting: Family Medicine

## 2015-08-09 MED ORDER — METHYLPHENIDATE HCL ER (OSM) 36 MG PO TBCR: 36.0000 mg | EXTENDED_RELEASE_TABLET | Freq: Every day | ORAL | Status: DC

## 2015-08-09 NOTE — Telephone Encounter (Signed)
ok 

## 2015-08-09 NOTE — Telephone Encounter (Signed)
Rx's ready and pt aware.  Needs letter for new employer that she takes this medication.  Letter printed.

## 2015-09-11 ENCOUNTER — Telehealth: Payer: Self-pay | Admitting: *Deleted

## 2015-09-11 NOTE — Telephone Encounter (Signed)
Received request from pharmacy for PA on Concerta.   PA submitted.   Dx: ADHD (F90.9)

## 2015-09-11 NOTE — Telephone Encounter (Signed)
Received PA determination.   PA approved.   Ref # PA- [id number].  Pharmacy made aware.

## 2015-10-26 ENCOUNTER — Other Ambulatory Visit: Payer: Self-pay | Admitting: Allergy and Immunology

## 2015-10-26 NOTE — Telephone Encounter (Signed)
Patient listed as Kathryn Lozano last seen for allergies Aug 2016

## 2015-12-09 ENCOUNTER — Emergency Department: Payer: 59

## 2015-12-09 ENCOUNTER — Emergency Department
Admission: EM | Admit: 2015-12-09 | Discharge: 2015-12-09 | Disposition: A | Discharge status: Home / Self Care | Payer: 59 | Attending: Emergency Medicine | Admitting: Emergency Medicine

## 2015-12-09 DIAGNOSIS — N23 Unspecified renal colic: Secondary | ICD-10-CM | POA: Diagnosis not present

## 2015-12-09 DIAGNOSIS — Z792 Long term (current) use of antibiotics: Secondary | ICD-10-CM | POA: Insufficient documentation

## 2015-12-09 DIAGNOSIS — Z79899 Other long term (current) drug therapy: Secondary | ICD-10-CM | POA: Insufficient documentation

## 2015-12-09 DIAGNOSIS — N201 Calculus of ureter: Secondary | ICD-10-CM | POA: Diagnosis not present

## 2015-12-09 DIAGNOSIS — F909 Attention-deficit hyperactivity disorder, unspecified type: Secondary | ICD-10-CM | POA: Diagnosis not present

## 2015-12-09 DIAGNOSIS — Z87442 Personal history of urinary calculi: Secondary | ICD-10-CM | POA: Diagnosis not present

## 2015-12-09 DIAGNOSIS — R109 Unspecified abdominal pain: Secondary | ICD-10-CM | POA: Diagnosis present

## 2015-12-09 LAB — URINALYSIS COMPLETE WITH MICROSCOPIC (ARMC ONLY)
BACTERIA UA: NONE SEEN
BILIRUBIN URINE: NEGATIVE
Glucose, UA: NEGATIVE mg/dL
Ketones, ur: NEGATIVE mg/dL
LEUKOCYTES UA: NEGATIVE
Nitrite: NEGATIVE
PH: 5 (ref 5.0–8.0)
Protein, ur: NEGATIVE mg/dL
SPECIFIC GRAVITY, URINE: 1.008 (ref 1.005–1.030)

## 2015-12-09 LAB — BASIC METABOLIC PANEL
Anion gap: 6 (ref 5–15)
BUN: 12 mg/dL (ref 6–20)
CALCIUM: 8.4 mg/dL — AB (ref 8.9–10.3)
CO2: 23 mmol/L (ref 22–32)
Chloride: 109 mmol/L (ref 101–111)
Creatinine, Ser: 0.79 mg/dL (ref 0.44–1.00)
GFR calc Af Amer: >60 mL/min (ref >60)
GLUCOSE: 109 mg/dL — AB (ref 65–99)
Potassium: 3.6 mmol/L (ref 3.5–5.1)
Sodium: 138 mmol/L (ref 135–145)

## 2015-12-09 LAB — CBC WITH DIFFERENTIAL/PLATELET
BASOS ABS: 0.1 10*3/uL (ref 0–0.1)
Basophils Relative: 1 %
Eosinophils Absolute: 0.1 10*3/uL (ref 0–0.7)
HEMATOCRIT: 40.7 % (ref 35.0–47.0)
Hemoglobin: 13.3 g/dL (ref 12.0–16.0)
LYMPHS ABS: 3.3 10*3/uL (ref 1.0–3.6)
MCH: 27.4 pg (ref 26.0–34.0)
MCHC: 32.6 g/dL (ref 32.0–36.0)
MCV: 84 fL (ref 80.0–100.0)
MONO ABS: 0.8 10*3/uL (ref 0.2–0.9)
Neutro Abs: 5 10*3/uL (ref 1.4–6.5)
Neutrophils Relative %: 54 %
PLATELETS: 216 10*3/uL (ref 150–440)
RBC: 4.84 MIL/uL (ref 3.80–5.20)
RDW: 13.2 % (ref 11.5–14.5)
WBC: 9.4 10*3/uL (ref 3.6–11.0)

## 2015-12-09 LAB — HCG, QUANTITATIVE, PREGNANCY: hCG, Beta Chain, Quant, S: 1 m[IU]/mL (ref <5)

## 2015-12-09 LAB — POCT PREGNANCY, URINE: Preg Test, Ur: NEGATIVE

## 2015-12-09 MED ORDER — HYDROMORPHONE HCL 1 MG/ML IJ SOLN
1.0000 mg | INTRAMUSCULAR | Status: AC
  Administered 2015-12-09: 1 mg via INTRAVENOUS
  Filled 2015-12-09: qty 1

## 2015-12-09 MED ORDER — DOCUSATE SODIUM 100 MG PO CAPS: ORAL_CAPSULE | ORAL | Status: DC

## 2015-12-09 MED ORDER — OXYCODONE-ACETAMINOPHEN 5-325 MG PO TABS: 1.0000 | ORAL_TABLET | ORAL | Status: DC | PRN

## 2015-12-09 MED ORDER — TAMSULOSIN HCL 0.4 MG PO CAPS: ORAL_CAPSULE | ORAL | Status: DC

## 2015-12-09 MED ORDER — OXYCODONE-ACETAMINOPHEN 5-325 MG PO TABS
2.0000 | ORAL_TABLET | Freq: Once | ORAL | Status: AC
  Administered 2015-12-09: 2 via ORAL
  Filled 2015-12-09: qty 2

## 2015-12-09 MED ORDER — ONDANSETRON 4 MG PO TBDP: ORAL_TABLET | ORAL | Status: DC

## 2015-12-09 MED ORDER — SODIUM CHLORIDE 0.9 % IV BOLUS (SEPSIS)
1000.0000 mL | INTRAVENOUS | Status: AC
  Administered 2015-12-09: 1000 mL via INTRAVENOUS

## 2015-12-09 MED ORDER — ONDANSETRON HCL 4 MG/2ML IJ SOLN
4.0000 mg | INTRAMUSCULAR | Status: AC
  Administered 2015-12-09: 4 mg via INTRAVENOUS
  Filled 2015-12-09: qty 2

## 2015-12-09 NOTE — ED Provider Notes (Signed)
[facility] Emergency Department Provider Note  ____________________________________________  Time seen: Approximately 5:28 PM  I have reviewed the triage vital signs and the nursing notes.   HISTORY  Chief Complaint Flank Pain    HPI Kathryn Lozano is a 30 y.o. female with history of multiple kidney stones including at least one requiring lithotripsy.  She reports tonight about an hour ago for acute onset severe sharp stabbing right flank pain with nausea.  Has had some mild aching flank pain for days, but acute severe pain started tonight.  Nothing makes better, worse with movement.  No vomiting.  No dysuria but feels like she is having difficulty urinating.  Denies fever/chills, CP, SOB, vaginal bleeding, vaginal discharge.  IUD in place.  Seen at urgent care yesterday, told she has hematuria, started on Toradol (IM, then PO) and Flomax.   Past Medical History  Diagnosis Date  . Migraines   . ADHD (attention deficit hyperactivity disorder)   . Anxiety   . Renal disorder     KIDNEY STONES  . PONV (postoperative nausea and vomiting)     Pt reports nausea, "sensitive stomach"    Patient Active Problem List   Diagnosis Date Noted  . Chronic cough 12/28/2012  . Post-nasal drip 12/28/2012  . Anxiety   . Migraines   . ADHD (attention deficit hyperactivity disorder)     Past Surgical History  Procedure Laterality Date  . Lithotripsy    . Wisdom tooth extraction    . Cholecystectomy N/A 09/14/2014    Procedure: LAPAROSCOPIC CHOLECYSTECTOMY;  Surgeon: Coralie Keens, MD;  Location: [place];  Service: General;  Laterality: N/A;    Current Outpatient Rx  Name  Route  Sig  Dispense  Refill  . ALPRAZolam (XANAX) 0.5 MG tablet   Oral   Take 1 tablet (0.5 mg total) by mouth every 8 (eight) hours as needed for sleep. Patient not taking: Reported on 06/13/2015   30 tablet   0   . amoxicillin (AMOXIL) 875 MG tablet   Oral   Take 1 tablet (875 mg  total) by mouth 2 (two) times daily.   20 tablet   0   . budesonide (RHINOCORT ALLERGY) 32 MCG/ACT nasal spray   Each Nare   Place 2 sprays into both nostrils daily.         Marland Kitchen docusate sodium (COLACE) 100 MG capsule      Take 1 tablet once or twice daily as needed for constipation while taking narcotic pain medicine   30 capsule   0   . Doxylamine-Phenylephrine-APAP (VICKS SINEX DAYTIME/NIGHTTIME PO)   Oral   Take 1 capsule by mouth daily.         . fluticasone (FLONASE) 50 MCG/ACT nasal spray   Each Nare   Place 2 sprays into both nostrils daily. Patient not taking: Reported on 06/13/2015   16 g   2   . levonorgestrel (MIRENA) 20 MCG/24HR IUD   Intrauterine   1 each by Intrauterine route.         . methylphenidate (CONCERTA) 36 MG PO CR tablet   Oral   Take 1 tablet (36 mg total) by mouth daily after breakfast.   30 tablet   0     ** DO NOT FILL UNTIL 10/07/2015**   . montelukast (SINGULAIR) 10 MG tablet      Take 1 tablet by mouth at  bedtime   90 tablet   0   . omeprazole (PRILOSEC)  40 MG capsule   Oral   Take 1 capsule (40 mg total) by mouth daily.   30 capsule   11   . ondansetron (ZOFRAN ODT) 4 MG disintegrating tablet      Allow 1-2 tablets to dissolve in your mouth every 8 hours as needed for nausea/vomiting   30 tablet   0   . oxyCODONE-acetaminophen (ROXICET) 5-325 MG tablet   Oral   Take 1-2 tablets by mouth every 4 (four) hours as needed for severe pain.   30 tablet   0   . predniSONE (DELTASONE) 20 MG tablet      3 tabs poqday 1-2, 2 tabs poqday 3-4, 1 tab poqday 5-6 Patient not taking: Reported on 06/13/2015   12 tablet   0   . ranitidine (ZANTAC) 150 MG tablet      Take 1 tablet by mouth two  times daily   180 tablet   3   . tamsulosin (FLOMAX) 0.4 MG CAPS capsule      Take 1 tablet by mouth daily until you pass the kidney stone or no longer have symptoms   14 capsule   0   . topiramate (TOPAMAX) 100 MG tablet    Oral   Take 1 tablet (100 mg total) by mouth 2 (two) times daily.   60 tablet   3     Allergies Review of patient's allergies indicates no known allergies.  No family history on file.  Social History Social History  Substance Use Topics  . Smoking status: Never Smoker   . Smokeless tobacco: Never Used  . Alcohol Use: Yes     Comment: Once every 6 months.     Review of Systems Constitutional: No fever/chills Eyes: No visual changes. ENT: No sore throat. Cardiovascular: Denies chest pain. Respiratory: Denies shortness of breath. Gastrointestinal: Severe right flank pain, no other abdominal pain.  No nausea, no vomiting.  No diarrhea.  No constipation. Genitourinary: Negative for dysuria.  Difficulty urinating. Musculoskeletal: Negative for back pain. Skin: Negative for rash. Neurological: Negative for headaches, focal weakness or numbness.  10-point ROS otherwise negative.  ____________________________________________   PHYSICAL EXAM:  VITAL SIGNS: ED Triage Vitals  Enc Vitals Group     BP 12/09/15 1714 113/73 mmHg     Pulse Rate 12/09/15 1714 92     Resp 12/09/15 1714 18     Temp 12/09/15 1714 98.1 F (36.7 C)     Temp Source 12/09/15 1714 Oral     SpO2 12/09/15 1714 97 %     Weight 12/09/15 1714 165 lb (74.844 kg)     Height 12/09/15 1714 5\' 3"  (1.6 m)     Head Cir --      Peak Flow --      Pain Score 12/09/15 1716 8     Pain Loc --      Pain Edu? --      Excl. in [place]? --     Constitutional: Alert and oriented. Arrived in severe distress from right flank pian Eyes: Conjunctivae are normal. PERRL. EOMI. Head: Atraumatic. Nose: No congestion/rhinnorhea. Mouth/Throat: Mucous membranes are moist.  Oropharynx non-erythematous. Neck: No stridor.  No meningeal signs.   Cardiovascular: Normal rate, regular rhythm. Good peripheral circulation. Grossly normal heart sounds.   Respiratory: Normal respiratory effort.  No retractions. Lungs CTAB. Gastrointestinal:  Soft, tender on right.  Severe right CVA tenderness. Musculoskeletal: No lower extremity tenderness nor edema. No gross deformities of extremities. Neurologic:  Normal speech  and language. No gross focal neurologic deficits are appreciated.  Skin:  Skin is warm, dry and intact. No rash noted. Psychiatric: Mood and affect are normal. Speech and behavior are normal.  ____________________________________________   LABS (all labs ordered are listed, but only abnormal results are displayed)  Labs Reviewed  URINALYSIS COMPLETEWITH MICROSCOPIC ([place]) - Abnormal; Notable for the following:    Color, Urine YELLOW (*)    APPearance CLEAR (*)    Hgb urine dipstick 3+ (*)    Squamous Epithelial / LPF 0-5 (*)    All other components within normal limits  BASIC METABOLIC PANEL - Abnormal; Notable for the following:    Glucose, Bld 109 (*)    Calcium 8.4 (*)    All other components within normal limits  CBC WITH DIFFERENTIAL/PLATELET  HCG, QUANTITATIVE, PREGNANCY  POC URINE PREG, ED  POCT PREGNANCY, URINE   ____________________________________________  EKG  None ____________________________________________  RADIOLOGY   Ct Renal Stone Study  12/09/2015  CLINICAL DATA:  30 year old female with right flank pain. History of kidney stones. EXAM: CT ABDOMEN AND PELVIS WITHOUT CONTRAST TECHNIQUE: Multidetector CT imaging of the abdomen and pelvis was performed following the standard protocol without IV contrast. COMPARISON:  CT dated 03/06/2014 FINDINGS: Evaluation of this exam is limited in the absence of intravenous contrast. The visualized lung bases are clear. No intra-abdominal free air or free fluid identified. Cholecystectomy. The liver, pancreas, spleen, and adrenal glands appear unremarkable. There is a 4 mm calculus in the distal right ureter adjacent to the right UVJ with mild right hydronephrosis. A 2 mm nonobstructing right renal interpolar calculus noted. Multiple nonobstructing  left renal calculi measuring up to 5 mm in the inferior pole of the left kidney. There is no hydronephrosis on the left. The visualized left ureter appears unremarkable. The urinary bladder is grossly unremarkable. The uterus is anteverted. An intrauterine device is noted. A 2 cm hypodense structure in the left ovary may represent normal ovarian tissue versus a dominant follicle/corpus luteum. Evaluation of the bowel is limited in the absence of oral contrast. There is no evidence of bowel obstruction or active inflammation. Normal appendix. The abdominal aorta and IVC appear grossly unremarkable on this noncontrast study. No portal venous gas identified. There is no adenopathy. The abdominal wall soft tissues are grossly unremarkable. A small fat containing umbilical hernia noted. There bilateral L5 pars defects with grade 1 L5-S1 anterolisthesis. No acute fracture. IMPRESSION: A 4 mm distal right ureteral calculus with mild right hydronephrosis. Small nonobstructing bilateral renal calculi. There is no hydronephrosis on the left. Electronically Signed   By: Anner Crete M.D.   On: 12/09/2015 19:38    ____________________________________________   PROCEDURES  Procedure(s) performed: None  Critical Care performed: No ____________________________________________   INITIAL IMPRESSION / ASSESSMENT AND PLAN / ED COURSE  Pertinent labs & imaging results that were available during my care of the patient were reviewed by me and considered in my medical decision making (see chart for details).  Did not provide Toradol since patient has been taking it including an IM injection yesterday.  Dilaudid 1 mg IV for comfort while evaluating.  4-mm right UVJ stone on CT.  IUD appropriately placed.  No evidence of UTI.  Pain well controlled, agrees to plan for outpatient follow up.  Gave usual/customary return precautions.   ____________________________________________  FINAL CLINICAL IMPRESSION(S) / ED  DIAGNOSES  Final diagnoses:  Right ureteral stone  Renal colic on right side     MEDICATIONS  GIVEN DURING THIS VISIT:  Medications  sodium chloride 0.9 % bolus 1,000 mL (0 mLs Intravenous Stopped 12/09/15 2005)  HYDROmorphone (DILAUDID) injection 1 mg (1 mg Intravenous Given 12/09/15 1746)  ondansetron (ZOFRAN) injection 4 mg (4 mg Intravenous Given 12/09/15 1746)  oxyCODONE-acetaminophen (PERCOCET/ROXICET) 5-325 MG per tablet 2 tablet (2 tablets Oral Given 12/09/15 2052)     NEW OUTPATIENT MEDICATIONS STARTED DURING THIS VISIT:  Discharge Medication List as of 12/09/2015  8:47 PM    START taking these medications   Details  docusate sodium (COLACE) 100 MG capsule Take 1 tablet once or twice daily as needed for constipation while taking narcotic pain medicine, Print    ondansetron (ZOFRAN ODT) 4 MG disintegrating tablet Allow 1-2 tablets to dissolve in your mouth every 8 hours as needed for nausea/vomiting, Print    oxyCODONE-acetaminophen (ROXICET) 5-325 MG tablet Take 1-2 tablets by mouth every 4 (four) hours as needed for severe pain., Starting 12/09/2015, Until Discontinued, Print    tamsulosin (FLOMAX) 0.4 MG CAPS capsule Take 1 tablet by mouth daily until you pass the kidney stone or no longer have symptoms, Print          Note:  This document was prepared using Dragon voice recognition software and may include unintentional dictation errors.   Hinda Kehr, MD 12/09/15 2330

## 2015-12-09 NOTE — ED Notes (Signed)
Pt states she went to urgent care yesterday and was found to have blood in her urine for right pelvic pain. Given flomax and toradol yesterday. Pain has increased today to right flank. Denies fever. Hx of kidney stones

## 2015-12-09 NOTE — Discharge Instructions (Signed)
You have been seen in the Emergency Department (ED) today for pain that we believe based on your workup, is caused by kidney stones.  You have a 4-mm stone in your right distal ureter that hopefully will pass soon.    As we have discussed, please drink plenty of fluids.  Please make a follow up appointment with the physician(s) listed elsewhere in this documentation.  You may take pain medication as needed but ONLY as prescribed.  Please also take your prescribed Flomax daily.  We also recommend that you take over-the-counter ibuprofen regularly according to label instructions over the next 5 days.  Take it with meals to minimize stomach discomfort.  Please see your doctor as soon as possible as stones may take 1-3 weeks to pass and you may require additional care or medications.  Do not drink alcohol, drive or participate in any other potentially dangerous activities while taking opiate pain medication as it may make you sleepy. Do not take this medication with any other sedating medications, either prescription or over-the-counter. If you were prescribed Percocet or Vicodin, do not take these with acetaminophen (Tylenol) as it is already contained within these medications.   Take Percocet as needed for severe pain.  This medication is an opiate (or narcotic) pain medication and can be habit forming.  Use it as little as possible to achieve adequate pain control.  Do not use or use it with extreme caution if you have a history of opiate abuse or dependence.  If you are on a pain contract with your primary care doctor or a pain specialist, be sure to let them know you were prescribed this medication today from the [facility].  This medication is intended for your use only - do not give any to anyone else and keep it in a secure place where nobody else, especially children, have access to it.  It will also cause or worsen constipation, so you may want to consider taking an  over-the-counter stool softener while you are taking this medication.  Return to the Emergency Department (ED) or call your doctor if you have any worsening pain, fever, painful urination, are unable to urinate, or develop other symptoms that concern you.    Kidney Stones Kidney stones (urolithiasis) are deposits that form inside your kidneys. The intense pain is caused by the stone moving through the urinary tract. When the stone moves, the ureter goes into spasm around the stone. The stone is usually passed in the urine.  CAUSES   A disorder that makes certain neck glands produce too much parathyroid hormone (primary hyperparathyroidism).  A buildup of uric acid crystals, similar to gout in your joints.  Narrowing (stricture) of the ureter.  A kidney obstruction present at birth (congenital obstruction).  Previous surgery on the kidney or ureters.  Numerous kidney infections. SYMPTOMS   Feeling sick to your stomach (nauseous).  Throwing up (vomiting).  Blood in the urine (hematuria).  Pain that usually spreads (radiates) to the groin.  Frequency or urgency of urination. DIAGNOSIS   Taking a history and physical exam.  Blood or urine tests.  CT scan.  Occasionally, an examination of the inside of the urinary bladder (cystoscopy) is performed. TREATMENT   Observation.  Increasing your fluid intake.  Extracorporeal shock wave lithotripsy--This is a noninvasive procedure that uses shock waves to break up kidney stones.  Surgery may be needed if you have severe pain or persistent obstruction. There are various surgical procedures. Most of  the procedures are performed with the use of small instruments. Only small incisions are needed to accommodate these instruments, so recovery time is minimized. The size, location, and chemical composition are all important variables that will determine the proper choice of action for you. Talk to your health care provider to better  understand your situation so that you will minimize the risk of injury to yourself and your kidney.  HOME CARE INSTRUCTIONS   Drink enough water and fluids to keep your urine clear or pale yellow. This will help you to pass the stone or stone fragments.  Strain all urine through the provided strainer. Keep all particulate matter and stones for your health care provider to see. The stone causing the pain may be as small as a grain of salt. It is very important to use the strainer each and every time you pass your urine. The collection of your stone will allow your health care provider to analyze it and verify that a stone has actually passed. The stone analysis will often identify what you can do to reduce the incidence of recurrences.  Only take over-the-counter or prescription medicines for pain, discomfort, or fever as directed by your health care provider.  Keep all follow-up visits as told by your health care provider. This is important.  Get follow-up X-rays if required. The absence of pain does not always mean that the stone has passed. It may have only stopped moving. If the urine remains completely obstructed, it can cause loss of kidney function or even complete destruction of the kidney. It is your responsibility to make sure X-rays and follow-ups are completed. Ultrasounds of the kidney can show blockages and the status of the kidney. Ultrasounds are not associated with any radiation and can be performed easily in a matter of minutes.  Make changes to your daily diet as told by your health care provider. You may be told to:  Limit the amount of salt that you eat.  Eat 5 or more servings of fruits and vegetables each day.  Limit the amount of meat, poultry, fish, and eggs that you eat.  Collect a 24-hour urine sample as told by your health care provider.You may need to collect another urine sample every 6-12 months. SEEK MEDICAL CARE IF:  You experience pain that is progressive and  unresponsive to any pain medicine you have been prescribed. SEEK IMMEDIATE MEDICAL CARE IF:   Pain cannot be controlled with the prescribed medicine.  You have a fever or shaking chills.  The severity or intensity of pain increases over 18 hours and is not relieved by pain medicine.  You develop a new onset of abdominal pain.  You feel faint or pass out.  You are unable to urinate.   This information is not intended to replace advice given to you by your health care provider. Make sure you discuss any questions you have with your health care provider.   Document Released: 06/23/2005 Document Revised: 03/14/2015 Document Reviewed: 11/24/2012 Elsevier Interactive Patient Education [employer].

## 2015-12-10 ENCOUNTER — Telehealth: Payer: Self-pay | Admitting: Family Medicine

## 2015-12-10 ENCOUNTER — Telehealth: Payer: Self-pay | Admitting: *Deleted

## 2015-12-10 NOTE — Telephone Encounter (Signed)
Received call from patient.   Requested information on stopping topamax since she wanted to get pregnant.   Advised to schedule OV with MD to discuss migraine treatment if she stops topamax.   Appointment scheduled.

## 2015-12-10 NOTE — Telephone Encounter (Signed)
Patient calling to ask some questions about taking Topamax Please call her at [phone],

## 2015-12-10 NOTE — Telephone Encounter (Signed)
Left message for patient to call regarding her topamax questions.

## 2015-12-13 ENCOUNTER — Encounter: Payer: Self-pay | Admitting: Urology

## 2015-12-13 ENCOUNTER — Ambulatory Visit (INDEPENDENT_AMBULATORY_CARE_PROVIDER_SITE_OTHER): Payer: 59 | Admitting: Urology

## 2015-12-13 VITALS — BP 119/73 | HR 102 | Ht 63.0 in | Wt 168.0 lb

## 2015-12-13 DIAGNOSIS — R310 Gross hematuria: Secondary | ICD-10-CM | POA: Diagnosis not present

## 2015-12-13 DIAGNOSIS — N201 Calculus of ureter: Secondary | ICD-10-CM | POA: Diagnosis not present

## 2015-12-13 DIAGNOSIS — N200 Calculus of kidney: Secondary | ICD-10-CM | POA: Diagnosis not present

## 2015-12-13 DIAGNOSIS — N132 Hydronephrosis with renal and ureteral calculous obstruction: Secondary | ICD-10-CM | POA: Diagnosis not present

## 2015-12-13 LAB — URINALYSIS, COMPLETE
BILIRUBIN UA: NEGATIVE
Glucose, UA: NEGATIVE
KETONES UA: NEGATIVE
Leukocytes, UA: NEGATIVE
NITRITE UA: NEGATIVE
Protein, UA: NEGATIVE
SPEC GRAV UA: 1.02 (ref 1.005–1.030)
Urobilinogen, Ur: 0.2 mg/dL (ref 0.2–1.0)
pH, UA: 7 (ref 5.0–7.5)

## 2015-12-13 LAB — MICROSCOPIC EXAMINATION
Bacteria, UA: NONE SEEN
EPITHELIAL CELLS (NON RENAL): NONE SEEN /HPF (ref 0–10)
WBC UA: NONE SEEN /HPF (ref 0–?)

## 2015-12-13 MED ORDER — ONDANSETRON HCL 4 MG PO TABS: 4.0000 mg | ORAL_TABLET | Freq: Three times a day (TID) | ORAL | Status: DC | PRN

## 2015-12-13 NOTE — Progress Notes (Addendum)
12/13/2015 4:48 PM   Kathryn Lozano 1986-05-07 [id number]  Referring provider: Susy Frizzle, MD [address] [place], [place] [address]  Chief Complaint  Patient presents with  . Nephrolithiasis    referred by ER    HPI: Patient is a 30 year old Caucasian female who presents today as a referral for a right distal ureteral calculus with mild hydronephrosis.    She states that she was having a dull aching in her pelvis that she attributed to her IUD.  The pain then became intense and then radiated to her right flank area.  She had difficulty urinating and was having gross hematuria.  She was seen at an urgent care and diagnosed with an UTI.  The next day the pain become even worse and she couldn't void.  She then sought treatment in the ED.    Patient was given IV analgesics and IV antibiotics.  A CT Renal stone study noted a right 4 mm distal ureteral calculus with mild hydronephrosis.  She was also found to have small non obstructing bilateral renal calculi.  She was discharged with Percocet and instructed to follow up with urology.  She has a prior history of stones.  She had her first stone when she was 70 for which she had ESWL and passed 2 to 3 stones already this year.  Her stone composition is unknown.  She has not had a metabolic work up.    Today, she is not having any urinary symptoms.  She is having flank pain, but it is controlled with Toradol.  Her UA today is unremarkable.       PMH: Past Medical History  Diagnosis Date  . Migraines   . ADHD (attention deficit hyperactivity disorder)   . Anxiety   . Renal disorder     KIDNEY STONES  . PONV (postoperative nausea and vomiting)     Pt reports nausea, "sensitive stomach"  . Heartburn     Surgical History: Past Surgical History  Procedure Laterality Date  . Lithotripsy  2005  . Wisdom tooth extraction    . Cholecystectomy N/A 09/14/2014    Procedure: LAPAROSCOPIC CHOLECYSTECTOMY;  Surgeon:  Coralie Keens, MD;  Location: [facility];  Service: General;  Laterality: N/A;    Home Medications:    Medication List       This list is accurate as of: 12/13/15 11:59 PM.  Always use your most recent med list.               ALPRAZolam 0.5 MG tablet  Commonly known as:  XANAX  Take 1 tablet (0.5 mg total) by mouth every 8 (eight) hours as needed for sleep.     amoxicillin 875 MG tablet  Commonly known as:  AMOXIL  Take 1 tablet (875 mg total) by mouth 2 (two) times daily.     docusate sodium 100 MG capsule  Commonly known as:  COLACE  Take 1 tablet once or twice daily as needed for constipation while taking narcotic pain medicine     fluticasone 50 MCG/ACT nasal spray  Commonly known as:  FLONASE  Place 2 sprays into both nostrils daily.     ketorolac 10 MG tablet  Commonly known as:  TORADOL     levonorgestrel 20 MCG/24HR IUD  Commonly known as:  MIRENA  1 each by Intrauterine route.     methylphenidate 36 MG CR tablet  Commonly known as:  CONCERTA  Take 1 tablet (36  mg total) by mouth daily after breakfast.     montelukast 10 MG tablet  Commonly known as:  SINGULAIR  Take 1 tablet by mouth at  bedtime     omeprazole 40 MG capsule  Commonly known as:  PRILOSEC  Take 1 capsule (40 mg total) by mouth daily.     ondansetron 4 MG disintegrating tablet  Commonly known as:  ZOFRAN ODT  Allow 1-2 tablets to dissolve in your mouth every 8 hours as needed for nausea/vomiting     ondansetron 4 MG tablet  Commonly known as:  ZOFRAN  Take 1 tablet (4 mg total) by mouth every 8 (eight) hours as needed for nausea or vomiting.     oxyCODONE-acetaminophen 5-325 MG tablet  Commonly known as:  ROXICET  Take 1-2 tablets by mouth every 4 (four) hours as needed for severe pain.     predniSONE 20 MG tablet  Commonly known as:  DELTASONE  3 tabs poqday 1-2, 2 tabs poqday 3-4, 1 tab poqday 5-6     ranitidine 150 MG tablet  Commonly known as:  ZANTAC  Take 1 tablet by mouth  two  times daily     RHINOCORT ALLERGY 32 MCG/ACT nasal spray  Generic drug:  budesonide  Place 2 sprays into both nostrils daily. Reported on 12/17/2015     tamsulosin 0.4 MG Caps capsule  Commonly known as:  FLOMAX  Take 1 tablet by mouth daily until you pass the kidney stone or no longer have symptoms     topiramate 100 MG tablet  Commonly known as:  TOPAMAX  Take 1 tablet (100 mg total) by mouth 2 (two) times daily.     VICKS SINEX DAYTIME/NIGHTTIME PO  Take 1 capsule by mouth daily. Reported on 12/13/2015        Allergies: No Known Allergies  Family History: Family History  Problem Relation Age of Onset  . Kidney cancer Neg Hx   . Bladder Cancer Neg Hx   . Alzheimer's disease Paternal Uncle   . Thyroid cancer Paternal Uncle     Social History:  reports that she has never smoked. She has never used smokeless tobacco. She reports that she drinks alcohol. She reports that she does not use illicit drugs.  ROS: UROLOGY Frequent Urination?: No Hard to postpone urination?: No Burning/pain with urination?: No Get up at night to urinate?: No Leakage of urine?: No Urine stream starts and stops?: No Trouble starting stream?: No Do you have to strain to urinate?: No Blood in urine?: Yes Urinary tract infection?: No Sexually transmitted disease?: No Injury to kidneys or bladder?: No Painful intercourse?: Yes Weak stream?: No Currently pregnant?: No Vaginal bleeding?: No Last menstrual period?: n  Gastrointestinal Nausea?: No Vomiting?: No Indigestion/heartburn?: No Diarrhea?: No Constipation?: No  Constitutional Fever: No Night sweats?: No Weight loss?: No Fatigue?: No  Skin Skin rash/lesions?: No Itching?: No  Eyes Blurred vision?: No Double vision?: No  Ears/Nose/Throat Sore throat?: No Sinus problems?: Yes  Hematologic/Lymphatic Swollen glands?: No Easy bruising?: Yes  Cardiovascular Leg swelling?: No Chest pain?: No  Respiratory Cough?:  No Shortness of breath?: No  Endocrine Excessive thirst?: No  Musculoskeletal Back pain?: Yes Joint pain?: No  Neurological Headaches?: Yes Dizziness?: No  Psychologic Depression?: No Anxiety?: No  Physical Exam: BP 119/73 mmHg  Pulse 102  Ht 5' 3"  (1.6 m)  Wt 168 lb (76.204 kg)  BMI 29.77 kg/m2  Constitutional: Well nourished. Alert and oriented, No acute distress. HEENT: [place] AT,  moist mucus membranes. Trachea midline, no masses. Cardiovascular: No clubbing, cyanosis, or edema. Respiratory: Normal respiratory effort, no increased work of breathing. GI: Abdomen is soft, non tender, non distended, no abdominal masses. Liver and spleen not palpable.  No hernias appreciated.  Stool sample for occult testing is not indicated.   GU: No CVA tenderness.  No bladder fullness or masses.   Skin: No rashes, bruises or suspicious lesions. Lymph: No cervical or inguinal adenopathy. Neurologic: Grossly intact, no focal deficits, moving all 4 extremities. Psychiatric: Normal mood and affect.  Laboratory Data: Lab Results  Component Value Date   WBC 9.4 12/09/2015   HGB 13.3 12/09/2015   HCT 40.7 12/09/2015   MCV 84.0 12/09/2015   PLT 216 12/09/2015    Lab Results  Component Value Date   CREATININE 0.79 12/09/2015     Lab Results  Component Value Date   TSH 2.346 02/03/2014       Component Value Date/Time   CHOL 204* 02/03/2014 1132   HDL 57 02/03/2014 1132   CHOLHDL 3.6 02/03/2014 1132   VLDL 12 02/03/2014 1132   LDLCALC 135* 02/03/2014 1132    Lab Results  Component Value Date   AST 15 03/06/2014   Lab Results  Component Value Date   ALT 18 03/06/2014    Urinalysis Results for orders placed or performed in visit on 12/13/15  CULTURE, URINE COMPREHENSIVE  Result Value Ref Range   Urine Culture, Comprehensive Final report    Result 1 Comment   Microscopic Examination  Result Value Ref Range   WBC, UA None seen 0 -  5 /hpf   RBC, UA 0-2 0 -  2 /hpf     Epithelial Cells (non renal) None seen 0 - 10 /hpf   Crystals Present (A) N/A   Crystal Type Amorphous Sediment N/A   Bacteria, UA None seen None seen/Few  Urinalysis, Complete  Result Value Ref Range   Specific Gravity, UA 1.020 1.005 - 1.030   pH, UA 7.0 5.0 - 7.5   Color, UA Yellow Yellow   Appearance Ur Cloudy (A) Clear   Leukocytes, UA Negative Negative   Protein, UA Negative Negative/Trace   Glucose, UA Negative Negative   Ketones, UA Negative Negative   RBC, UA Trace (A) Negative   Bilirubin, UA Negative Negative   Urobilinogen, Ur 0.2 0.2 - 1.0 mg/dL   Nitrite, UA Negative Negative   Microscopic Examination See below:     Pertinent Imaging: CLINICAL DATA: 30 year old female with right flank pain. History of kidney stones.  EXAM: CT ABDOMEN AND PELVIS WITHOUT CONTRAST  TECHNIQUE: Multidetector CT imaging of the abdomen and pelvis was performed following the standard protocol without IV contrast.  COMPARISON: CT dated 03/06/2014  FINDINGS: Evaluation of this exam is limited in the absence of intravenous contrast.  The visualized lung bases are clear. No intra-abdominal free air or free fluid identified.  Cholecystectomy. The liver, pancreas, spleen, and adrenal glands appear unremarkable. There is a 4 mm calculus in the distal right ureter adjacent to the right UVJ with mild right hydronephrosis. A 2 mm nonobstructing right renal interpolar calculus noted. Multiple nonobstructing left renal calculi measuring up to 5 mm in the inferior pole of the left kidney. There is no hydronephrosis on the left. The visualized left ureter appears unremarkable. The urinary bladder is grossly unremarkable.  The uterus is anteverted. An intrauterine device is noted. A 2 cm hypodense structure in the left ovary may represent normal ovarian tissue versus  a dominant follicle/corpus luteum.  Evaluation of the bowel is limited in the absence of oral contrast. There  is no evidence of bowel obstruction or active inflammation. Normal appendix.  The abdominal aorta and IVC appear grossly unremarkable on this noncontrast study. No portal venous gas identified. There is no adenopathy. The abdominal wall soft tissues are grossly unremarkable. A small fat containing umbilical hernia noted. There bilateral L5 pars defects with grade 1 L5-S1 anterolisthesis. No acute fracture.  IMPRESSION: A 4 mm distal right ureteral calculus with mild right hydronephrosis.  Small nonobstructing bilateral renal calculi. There is no hydronephrosis on the left.   Electronically Signed  By: Anner Crete M.D.  On: 12/09/2015 19:38   Assessment & Plan:    Patient will undergo right ESWL for definitive treatment for her right ureteral stone.  1. Right ureteral stone:   We discussed treatment options, such as MET, ESWL and URS/LL/ureteral stent.  She has had ESWL in the past, so she is comfortable with this procedure.   The stone's HU is >1300 and the skin to stone distance is > 15 cm.  The effectiveness of the treatment should reach 88%.  I did advise her that if the stone would not pass after lithotripsy, she may have to undergo a procedure that would require general anesthesia.  The risks of ESWL are explained to the patient, such as: bleeding, infection, adjacent tissue/organ damage, ineffective/incomplete stone fragmentation and [place].    - Urinalysis, Complete - CULTURE, URINE COMPREHENSIVE  2. Bilateral nephrolithiasis:   Patient has a significant stone history.  I would encourage a metabolic work up once this acute stone has passed.    3. Right hydronephrosis:   Patient was found to have right hydronephrosis due to an ureteral stone.  A RUS will be obtained one month after she has passed the ureteral stone to ensure the hydronephrosis has resolved.    4. Microscopic hematuria:   We will continue to monitor the patient's UA after the  treatment/passage of the stone to ensure the hematuria has resolved.  If hematuria persists, we will pursue a hematuria workup with CT Urogram and cystoscopy if appropriate.      No Follow-up on file.  These notes generated with voice recognition software. I apologize for typographical errors.  Zara Council, [facility] [address] [place], [place] [address] [phone]

## 2015-12-15 LAB — CULTURE, URINE COMPREHENSIVE

## 2015-12-17 ENCOUNTER — Other Ambulatory Visit: Payer: Self-pay

## 2015-12-17 ENCOUNTER — Encounter: Payer: Self-pay | Admitting: Family Medicine

## 2015-12-17 ENCOUNTER — Ambulatory Visit (INDEPENDENT_AMBULATORY_CARE_PROVIDER_SITE_OTHER): Payer: 59 | Admitting: Family Medicine

## 2015-12-17 VITALS — BP 104/64 | HR 88 | Temp 98.2°F | Resp 18 | Wt 168.0 lb

## 2015-12-17 DIAGNOSIS — G43009 Migraine without aura, not intractable, without status migrainosus: Secondary | ICD-10-CM | POA: Diagnosis not present

## 2015-12-17 DIAGNOSIS — N200 Calculus of kidney: Secondary | ICD-10-CM

## 2015-12-17 MED ORDER — RANITIDINE HCL 150 MG PO TABS: ORAL_TABLET | ORAL | Status: DC

## 2015-12-17 NOTE — Progress Notes (Signed)
Subjective:    Patient ID: Kathryn Lozano, female    DOB: 11-06-1985, 30 y.o.   MRN: [id number]  HPI Patient has a history of migraines and ADHD. She is currently on Topamax for migraine prevention. If she stops Topamax, she develops a migraine usually within the week. She wants to come off Concerta because she is about ready to start trying to have a family. She has started prenatal vitamins Rich and folic acid. Her question is what options she has to replace Topamax Past Medical History  Diagnosis Date  . Migraines   . ADHD (attention deficit hyperactivity disorder)   . Anxiety   . Renal disorder     KIDNEY STONES  . PONV (postoperative nausea and vomiting)     Pt reports nausea, "sensitive stomach"  . Heartburn    Past Surgical History  Procedure Laterality Date  . Lithotripsy  2005  . Wisdom tooth extraction    . Cholecystectomy N/A 09/14/2014    Procedure: LAPAROSCOPIC CHOLECYSTECTOMY;  Surgeon: Coralie Keens, MD;  Location: [facility];  Service: General;  Laterality: N/A;   Current Outpatient Prescriptions on File Prior to Visit  Medication Sig Dispense Refill  . ALPRAZolam (XANAX) 0.5 MG tablet Take 1 tablet (0.5 mg total) by mouth every 8 (eight) hours as needed for sleep. 30 tablet 0  . ketorolac (TORADOL) 10 MG tablet     . levonorgestrel (MIRENA) 20 MCG/24HR IUD 1 each by Intrauterine route.    . methylphenidate (CONCERTA) 36 MG PO CR tablet Take 1 tablet (36 mg total) by mouth daily after breakfast. 30 tablet 0  . montelukast (SINGULAIR) 10 MG tablet Take 1 tablet by mouth at  bedtime 90 tablet 0  . omeprazole (PRILOSEC) 40 MG capsule Take 1 capsule (40 mg total) by mouth daily. 30 capsule 11  . ondansetron (ZOFRAN) 4 MG tablet Take 1 tablet (4 mg total) by mouth every 8 (eight) hours as needed for nausea or vomiting. 20 tablet 0  . oxyCODONE-acetaminophen (ROXICET) 5-325 MG tablet Take 1-2 tablets by mouth every 4 (four) hours as needed for severe pain. 30 tablet 0  .  topiramate (TOPAMAX) 100 MG tablet Take 1 tablet (100 mg total) by mouth 2 (two) times daily. 60 tablet 3   No current facility-administered medications on file prior to visit.   No Known Allergies Social History   Social History  . Marital Status: Married    Spouse Name: N/A  . Number of Children: N/A  . Years of Education: N/A   Occupational History  . Not on file.   Social History Main Topics  . Smoking status: Never Smoker   . Smokeless tobacco: Never Used  . Alcohol Use: Yes     Comment: Once every 6 months.   . Drug Use: No  . Sexual Activity: Not on file   Other Topics Concern  . Not on file   Social History Narrative      Review of Systems  All other systems reviewed and are negative.      Objective:   Physical Exam  Cardiovascular: Normal rate, regular rhythm and normal heart sounds.   Pulmonary/Chest: Effort normal and breath sounds normal.  Vitals reviewed.         Assessment & Plan:  Migraines Discontinue Topamax immediately. Discontinue Concerta. Discontinue omeprazole and replaced with Zantac 150 mg by mouth daily. We discussed starting propranolol which is category C for migraine prevention but the patient would like to try coming off  Topamax, cold [place]. She will use Tylenol and/or Zofran as needed for migraine headaches if they develop

## 2015-12-18 ENCOUNTER — Ambulatory Visit
Admission: RE | Admit: 2015-12-18 | Discharge: 2015-12-18 | Disposition: A | Discharge status: Home / Self Care | Payer: 59 | Source: Ambulatory Visit | Attending: Urology | Admitting: Urology

## 2015-12-18 ENCOUNTER — Telehealth: Payer: Self-pay | Admitting: Urology

## 2015-12-18 ENCOUNTER — Other Ambulatory Visit: Payer: Self-pay | Admitting: Urology

## 2015-12-18 ENCOUNTER — Other Ambulatory Visit: Payer: Self-pay | Admitting: Radiology

## 2015-12-18 DIAGNOSIS — N200 Calculus of kidney: Secondary | ICD-10-CM | POA: Diagnosis present

## 2015-12-18 DIAGNOSIS — R938 Abnormal findings on diagnostic imaging of other specified body structures: Secondary | ICD-10-CM | POA: Insufficient documentation

## 2015-12-18 NOTE — Telephone Encounter (Signed)
Patient called the office today.  She had a KUB this morning.  She is wondering if she should plan to proceed with lithotripsy on Thursday.  Please advise.

## 2015-12-18 NOTE — Telephone Encounter (Signed)
Pt notified that right side stone was not appreciated on KUB so ESWL scheduled for 12/20/15 was cancelled & f/u appt changed to 4 wk f/u with RUS prior per Zara Council. Pt voices understanding.

## 2015-12-20 ENCOUNTER — Encounter: Admission: RE | Payer: Self-pay | Source: Ambulatory Visit

## 2015-12-20 ENCOUNTER — Ambulatory Visit: Admission: RE | Admit: 2015-12-20 | Payer: 59 | Source: Ambulatory Visit | Admitting: Urology

## 2015-12-20 SURGERY — LITHOTRIPSY, ESWL
Anesthesia: Moderate Sedation | Laterality: Right

## 2016-01-03 ENCOUNTER — Ambulatory Visit: Payer: 59 | Admitting: Urology

## 2016-01-18 ENCOUNTER — Ambulatory Visit
Admission: RE | Admit: 2016-01-18 | Discharge: 2016-01-18 | Disposition: A | Discharge status: Home / Self Care | Payer: 59 | Source: Ambulatory Visit | Attending: Urology | Admitting: Urology

## 2016-01-18 DIAGNOSIS — N200 Calculus of kidney: Secondary | ICD-10-CM | POA: Diagnosis present

## 2016-01-22 ENCOUNTER — Encounter: Payer: Self-pay | Admitting: Urology

## 2016-01-22 ENCOUNTER — Ambulatory Visit (INDEPENDENT_AMBULATORY_CARE_PROVIDER_SITE_OTHER): Payer: 59 | Admitting: Urology

## 2016-01-22 VITALS — BP 125/84 | HR 77 | Ht 63.0 in | Wt 174.8 lb

## 2016-01-22 DIAGNOSIS — N200 Calculus of kidney: Secondary | ICD-10-CM

## 2016-01-22 DIAGNOSIS — N132 Hydronephrosis with renal and ureteral calculous obstruction: Secondary | ICD-10-CM

## 2016-01-22 DIAGNOSIS — N201 Calculus of ureter: Secondary | ICD-10-CM | POA: Diagnosis not present

## 2016-01-22 DIAGNOSIS — R3129 Other microscopic hematuria: Secondary | ICD-10-CM | POA: Diagnosis not present

## 2016-01-22 LAB — URINALYSIS, COMPLETE
BILIRUBIN UA: NEGATIVE
GLUCOSE, UA: NEGATIVE
KETONES UA: NEGATIVE
LEUKOCYTES UA: NEGATIVE
NITRITE UA: NEGATIVE
Protein, UA: NEGATIVE
RBC, UA: NEGATIVE
SPEC GRAV UA: 1.02 (ref 1.005–1.030)
UUROB: 0.2 mg/dL (ref 0.2–1.0)
pH, UA: 6.5 (ref 5.0–7.5)

## 2016-01-22 LAB — MICROSCOPIC EXAMINATION
BACTERIA UA: NONE SEEN
WBC, UA: NONE SEEN /hpf (ref 0–?)

## 2016-01-22 NOTE — Progress Notes (Signed)
1:39 PM   ERIE COORS 1986-05-09 [id number]  Referring provider: Susy Frizzle, MD [address] [place], [place] [address]  Chief Complaint  Patient presents with  . Nephrolithiasis    4 week follow up    HPI: Patient is a 30 year old Caucasian female who presents today for follow up for a 4 mm distal right ureteral calculus.   Background history Patient was a referral for a right distal ureteral calculus with mild hydronephrosis.  She states that she was having a dull aching in her pelvis that she attributed to her IUD.  The pain then became intense and then radiated to her right flank area.  She had difficulty urinating and was having gross hematuria.  She was seen at an urgent care and diagnosed with an UTI.  The next day the pain become even worse and she couldn't void.  She then sought treatment in the ED.   Patient was given IV analgesics and IV antibiotics.  A CT Renal stone study noted a right 4 mm distal ureteral calculus with mild hydronephrosis.  She was also found to have small non obstructing bilateral renal calculi.  She was discharged with Percocet and instructed to follow up with urology. She has a prior history of stones.  She had her first stone when she was 52 for which she had ESWL and passed 2 to 3 stones already this year.  Her stone composition is unknown.  She has not had a metabolic work up.    We saw the patient on 12/13/2015 and she felt that she may have passed the stone two weeks later.  A KUB did not identify a stone and her follow up RUS did not demonstrate any hydronephrosis and bilateral ureteral jets.  Today, she is not having any urinary symptoms.  She is not having flank pain.  Her UA is clear.  She's not had recent fevers, chills, nausea or vomiting.    She does have stones in her left kidney and she and her husband are attempting to conceive and she would like to decrease her stone burden before becoming pregnant.    PMH: Past  Medical History  Diagnosis Date  . Migraines   . ADHD (attention deficit hyperactivity disorder)   . Anxiety   . Renal disorder     KIDNEY STONES  . PONV (postoperative nausea and vomiting)     Pt reports nausea, "sensitive stomach"  . Heartburn     Surgical History: Past Surgical History  Procedure Laterality Date  . Lithotripsy  2005  . Wisdom tooth extraction    . Cholecystectomy N/A 09/14/2014    Procedure: LAPAROSCOPIC CHOLECYSTECTOMY;  Surgeon: Coralie Keens, MD;  Location: [facility];  Service: General;  Laterality: N/A;    Home Medications:    Medication List       This list is accurate as of: 01/22/16  1:39 PM.  Always use your most recent med list.               acetaminophen 500 MG tablet  Commonly known as:  TYLENOL  Take 1,000 mg by mouth every 8 (eight) hours as needed (migraine headache).     ALEVE PO  Take 2 tablets by mouth daily as needed (headache).     ALPRAZolam 0.5 MG tablet  Commonly known as:  XANAX  Take 1 tablet (0.5 mg total) by mouth every 8 (eight) hours as needed for sleep.  methylphenidate 36 MG CR tablet  Commonly known as:  CONCERTA  Take 1 tablet (36 mg total) by mouth daily after breakfast.     montelukast 10 MG tablet  Commonly known as:  SINGULAIR  Take 1 tablet by mouth at  bedtime     omeprazole 40 MG capsule  Commonly known as:  PRILOSEC  Take 1 capsule (40 mg total) by mouth daily.     ondansetron 4 MG tablet  Commonly known as:  ZOFRAN  Take 1 tablet (4 mg total) by mouth every 8 (eight) hours as needed for nausea or vomiting.     oxyCODONE-acetaminophen 5-325 MG tablet  Commonly known as:  ROXICET  Take 1-2 tablets by mouth every 4 (four) hours as needed for severe pain.     prenatal multivitamin Tabs tablet  Take 1 tablet by mouth daily at 12 noon.     ranitidine 150 MG tablet  Commonly known as:  ZANTAC  Take 1 tablet by mouth at night     topiramate 100 MG tablet  Commonly known as:  TOPAMAX  Take 1  tablet (100 mg total) by mouth 2 (two) times daily.        Allergies: No Known Allergies  Family History: Family History  Problem Relation Age of Onset  . Kidney cancer Neg Hx   . Bladder Cancer Neg Hx   . Alzheimer's disease Paternal Uncle   . Thyroid cancer Paternal Uncle     Social History:  reports that she has never smoked. She has never used smokeless tobacco. She reports that she drinks alcohol. She reports that she does not use illicit drugs.  ROS: UROLOGY Frequent Urination?: No Hard to postpone urination?: No Burning/pain with urination?: No Get up at night to urinate?: No Leakage of urine?: No Urine stream starts and stops?: No Trouble starting stream?: No Do you have to strain to urinate?: No Blood in urine?: No Urinary tract infection?: No Sexually transmitted disease?: No Injury to kidneys or bladder?: No Painful intercourse?: Yes Weak stream?: No Currently pregnant?: No Vaginal bleeding?: No Last menstrual period?: n  Gastrointestinal Nausea?: No Vomiting?: No Indigestion/heartburn?: No Diarrhea?: No Constipation?: No  Constitutional Fever: No Night sweats?: No Weight loss?: No Fatigue?: Yes  Skin Skin rash/lesions?: No Itching?: No  Eyes Blurred vision?: No Double vision?: No  Ears/Nose/Throat Sore throat?: No Sinus problems?: Yes  Hematologic/Lymphatic Swollen glands?: No Easy bruising?: No  Cardiovascular Leg swelling?: No Chest pain?: No  Respiratory Cough?: No Shortness of breath?: No  Endocrine Excessive thirst?: No  Musculoskeletal Back pain?: No Joint pain?: No  Neurological Headaches?: No Dizziness?: No  Psychologic Depression?: No Anxiety?: No  Physical Exam: BP 125/84 mmHg  Pulse 77  Ht 5\' 3"  (1.6 m)  Wt 174 lb 12.8 oz (79.289 kg)  BMI 30.97 kg/m2  LMP 12/26/2015  Constitutional: Well nourished. Alert and oriented, No acute distress. HEENT:  AT, moist mucus membranes. Trachea midline, no  masses. Cardiovascular: No clubbing, cyanosis, or edema. Respiratory: Normal respiratory effort, no increased work of breathing. GI: Abdomen is soft, non tender, non distended, no abdominal masses. Liver and spleen not palpable.  No hernias appreciated.  Stool sample for occult testing is not indicated.   GU: No CVA tenderness.  No bladder fullness or masses.   Skin: No rashes, bruises or suspicious lesions. Lymph: No cervical or inguinal adenopathy. Neurologic: Grossly intact, no focal deficits, moving all 4 extremities. Psychiatric: Normal mood and affect.  Laboratory Data: Lab Results  Component Value Date   WBC 9.4 12/09/2015   HGB 13.3 12/09/2015   HCT 40.7 12/09/2015   MCV 84.0 12/09/2015   PLT 216 12/09/2015    Lab Results  Component Value Date   CREATININE 0.79 12/09/2015     Lab Results  Component Value Date   TSH 2.346 02/03/2014       Component Value Date/Time   CHOL 204* 02/03/2014 1132   HDL 57 02/03/2014 1132   CHOLHDL 3.6 02/03/2014 1132   VLDL 12 02/03/2014 1132   LDLCALC 135* 02/03/2014 1132    Lab Results  Component Value Date   AST 15 03/06/2014   Lab Results  Component Value Date   ALT 18 03/06/2014    Urinalysis Results for orders placed or performed in visit on 01/22/16  Microscopic Examination  Result Value Ref Range   WBC, UA None seen 0 -  5 /hpf   RBC, UA 0-2 0 -  2 /hpf   Epithelial Cells (non renal) 0-10 0 - 10 /hpf   Mucus, UA Present (A) Not Estab.   Bacteria, UA None seen None seen/Few  Urinalysis, Complete  Result Value Ref Range   Specific Gravity, UA 1.020 1.005 - 1.030   pH, UA 6.5 5.0 - 7.5   Color, UA Yellow Yellow   Appearance Ur Clear Clear   Leukocytes, UA Negative Negative   Protein, UA Negative Negative/Trace   Glucose, UA Negative Negative   Ketones, UA Negative Negative   RBC, UA Negative Negative   Bilirubin, UA Negative Negative   Urobilinogen, Ur 0.2 0.2 - 1.0 mg/dL   Nitrite, UA Negative Negative    Microscopic Examination See below:     Pertinent Imaging: CLINICAL DATA: 30 year old female with right flank pain. History of kidney stones.  EXAM: CT ABDOMEN AND PELVIS WITHOUT CONTRAST  TECHNIQUE: Multidetector CT imaging of the abdomen and pelvis was performed following the standard protocol without IV contrast.  COMPARISON: CT dated 03/06/2014  FINDINGS: Evaluation of this exam is limited in the absence of intravenous contrast.  The visualized lung bases are clear. No intra-abdominal free air or free fluid identified.  Cholecystectomy. The liver, pancreas, spleen, and adrenal glands appear unremarkable. There is a 4 mm calculus in the distal right ureter adjacent to the right UVJ with mild right hydronephrosis. A 2 mm nonobstructing right renal interpolar calculus noted. Multiple nonobstructing left renal calculi measuring up to 5 mm in the inferior pole of the left kidney. There is no hydronephrosis on the left. The visualized left ureter appears unremarkable. The urinary bladder is grossly unremarkable.  The uterus is anteverted. An intrauterine device is noted. A 2 cm hypodense structure in the left ovary may represent normal ovarian tissue versus a dominant follicle/corpus luteum.  Evaluation of the bowel is limited in the absence of oral contrast. There is no evidence of bowel obstruction or active inflammation. Normal appendix.  The abdominal aorta and IVC appear grossly unremarkable on this noncontrast study. No portal venous gas identified. There is no adenopathy. The abdominal wall soft tissues are grossly unremarkable. A small fat containing umbilical hernia noted. There bilateral L5 pars defects with grade 1 L5-S1 anterolisthesis. No acute fracture.  IMPRESSION: A 4 mm distal right ureteral calculus with mild right hydronephrosis.  Small nonobstructing bilateral renal calculi. There is no hydronephrosis on the left.   Electronically  Signed  By: Anner Crete M.D.  On: 12/09/2015 19:38       CLINICAL DATA: Kidney stones  EXAM:  RENAL / URINARY TRACT ULTRASOUND COMPLETE  COMPARISON: CT scan 12/09/2015  FINDINGS: Right Kidney:  Length: 11.7 cm. Echogenicity within normal limits. No mass or hydronephrosis visualized.  Left Kidney:  Length: 10.7 cm. Echogenicity within normal limits. No mass or hydronephrosis visualized. Shadowing calculus in mid to lower pole of the left kidney measures 5 mm.  Bladder:  Appears normal for degree of bladder distention. Bilateral ureteral jets are visualized.  IMPRESSION: No hydronephrosis. Calculus in mid to lower pole of the left kidney measures 5 mm nonobstructive. Unremarkable urinary bladder. Bilateral ureteral jets.   Electronically Signed  By: Lahoma Crocker M.D.  On: 01/18/2016 14:23   Assessment & Plan:    Patient will undergo left ureteroscopy with laser lithotripsy with ureteral stent placement for definitive treatment for her left renal stones.    1. Left renal stone:   Patient would like to pursue definitive treatment of her left renal stones as she is trying to conceive and would like to decrease her stone burden prior to becoming pregnant.  I stated her best option would be URS/LL/ureteral stent placement.  I explained to the patient how the procedure is performed and the risks involved.  I informed patient that she will have a stent placed during the procedure and will remain in place after the procedure for a short time.  It will be removed in the office with a cystoscope, unless a string in left in place.  I informed that patient that about 50% of patients who undergo ureteroscopy and have a stent will have "stent pain," and this is by far the most common risk/complaint following ureteroscopy. A stent is a soft plastic tube (about half the size of IV tubing) that allows the kidney to drain to the bladder regardless of edema or  obstruction. Not only can the stent "rub" on the inside of the bladder, causing a feeling of needing to urinate/overactive bladder, but also the stent allows urine to pass up from the bladder to the kidney during urination - causing symptoms from a warm, tingling sensation to intense pain in the affected flank.  They may be residual stones within the kidney or ureter may be present up to 40% of the time following ureteroscopy, depending on the original stone size and location. These stone fragments will be seen and addressed on follow-up imaging.  Injury to the ureter is the most common intra-operative complication during ureteroscopy. The reported risk of perforation ranges greatly, depending on whether it is defined as a complete perforation (0.1-0.7% - think of this as a hole through the entire ureter), a partial perforation (1.6% - a hole nearly through the entire ureter), or mucosal tear/scrape (5% - these are similar to a sore on the inside of the mouth). Almost 100% of these will heal with prolonged stenting (anywhere between 2 - 4 weeks). Should a large perforation occur, your urologist may chose to stop the procedure and return on another day when the ureter has had time to heal.  2. Right ureteral stone:   Patient has most likely passed the right ureteral stone as she is not having any further flank pain, hematuria, it is not visible on KUB and the RUS was normal.     - Urinalysis, Complete - CULTURE, URINE COMPREHENSIVE - BMP - hCG, serum qualitative - CBC with Differential/Platelet  3. Bilateral nephrolithiasis:   Patient is interested in a metabolic work up.  We will obtain the study once she has recovered from her left URS/LL/ureteral stent  placement and removal.      4. Right hydronephrosis:   The right hydronephrosis has resolved.    5. Microscopic hematuria:   We will continue to monitor the patient's UA after the treatment/passage of the stone to ensure the hematuria has resolved.  If  hematuria persists, we will pursue a hematuria workup with CT Urogram and cystoscopy if appropriate.      Return for schedule left URS/LL/ureteral stent placement.  These notes generated with voice recognition software. I apologize for typographical errors.  Zara Council, [facility] [address] [place], [place] [address] [phone]

## 2016-01-23 LAB — CBC WITH DIFFERENTIAL/PLATELET
BASOS: 0 %
Basophils Absolute: 0 10*3/uL (ref 0.0–0.2)
EOS (ABSOLUTE): 0.1 10*3/uL (ref 0.0–0.4)
EOS: 1 %
HEMATOCRIT: 43.5 % (ref 34.0–46.6)
HEMOGLOBIN: 14.2 g/dL (ref 11.1–15.9)
IMMATURE GRANS (ABS): 0 10*3/uL (ref 0.0–0.1)
IMMATURE GRANULOCYTES: 0 %
LYMPHS: 34 %
Lymphocytes Absolute: 2.2 10*3/uL (ref 0.7–3.1)
MCH: 27.3 pg (ref 26.6–33.0)
MCHC: 32.6 g/dL (ref 31.5–35.7)
MCV: 84 fL (ref 79–97)
MONOCYTES: 9 %
MONOS ABS: 0.6 10*3/uL (ref 0.1–0.9)
NEUTROS PCT: 56 %
Neutrophils Absolute: 3.7 10*3/uL (ref 1.4–7.0)
PLATELETS: 237 10*3/uL (ref 150–379)
RBC: 5.2 x10E6/uL (ref 3.77–5.28)
RDW: 13.4 % (ref 12.3–15.4)
WBC: 6.6 10*3/uL (ref 3.4–10.8)

## 2016-01-23 LAB — BASIC METABOLIC PANEL
BUN/Creatinine Ratio: 13 (ref 9–23)
BUN: 10 mg/dL (ref 6–20)
CO2: 24 mmol/L (ref 18–29)
CREATININE: 0.77 mg/dL (ref 0.57–1.00)
Calcium: 9.6 mg/dL (ref 8.7–10.2)
Chloride: 99 mmol/L (ref 96–106)
GFR calc Af Amer: 121 mL/min/{1.73_m2} (ref >59)
GFR, EST NON AFRICAN AMERICAN: 105 mL/min/{1.73_m2} (ref >59)
Glucose: 60 mg/dL — ABNORMAL LOW (ref 65–99)
Potassium: 4.8 mmol/L (ref 3.5–5.2)
SODIUM: 139 mmol/L (ref 134–144)

## 2016-01-23 LAB — HCG, SERUM, QUALITATIVE: HCG, BETA SUBUNIT, QUAL, SERUM: NEGATIVE m[IU]/mL (ref <6)

## 2016-01-24 ENCOUNTER — Ambulatory Visit: Payer: 59 | Admitting: Anesthesiology

## 2016-01-24 ENCOUNTER — Encounter
Admission: RE | Disposition: A | Discharge status: Home / Self Care | Payer: Self-pay | Source: Ambulatory Visit | Attending: Urology

## 2016-01-24 ENCOUNTER — Encounter: Payer: Self-pay | Admitting: Urology

## 2016-01-24 ENCOUNTER — Ambulatory Visit
Admission: RE | Admit: 2016-01-24 | Discharge: 2016-01-24 | Disposition: A | Discharge status: Home / Self Care | Payer: 59 | Source: Ambulatory Visit | Attending: Urology | Admitting: Urology

## 2016-01-24 DIAGNOSIS — G43909 Migraine, unspecified, not intractable, without status migrainosus: Secondary | ICD-10-CM | POA: Diagnosis not present

## 2016-01-24 DIAGNOSIS — F419 Anxiety disorder, unspecified: Secondary | ICD-10-CM | POA: Insufficient documentation

## 2016-01-24 DIAGNOSIS — N132 Hydronephrosis with renal and ureteral calculous obstruction: Secondary | ICD-10-CM | POA: Insufficient documentation

## 2016-01-24 DIAGNOSIS — F909 Attention-deficit hyperactivity disorder, unspecified type: Secondary | ICD-10-CM | POA: Insufficient documentation

## 2016-01-24 DIAGNOSIS — Z79899 Other long term (current) drug therapy: Secondary | ICD-10-CM | POA: Insufficient documentation

## 2016-01-24 DIAGNOSIS — N200 Calculus of kidney: Secondary | ICD-10-CM | POA: Diagnosis present

## 2016-01-24 HISTORY — PX: URETEROSCOPY: SHX842

## 2016-01-24 HISTORY — PX: CYSTOSCOPY WITH STENT PLACEMENT: SHX5790

## 2016-01-24 LAB — POCT PREGNANCY, URINE: PREG TEST UR: NEGATIVE

## 2016-01-24 LAB — CULTURE, URINE COMPREHENSIVE

## 2016-01-24 SURGERY — URETEROSCOPY
Anesthesia: General

## 2016-01-24 MED ORDER — CEFAZOLIN SODIUM-DEXTROSE 2-4 GM/100ML-% IV SOLN
INTRAVENOUS | Status: AC
  Administered 2016-01-24: 2 g via INTRAVENOUS
  Filled 2016-01-24: qty 100

## 2016-01-24 MED ORDER — OXYBUTYNIN CHLORIDE 5 MG PO TABS: 5.0000 mg | ORAL_TABLET | Freq: Three times a day (TID) | ORAL | Status: DC | PRN

## 2016-01-24 MED ORDER — CEFAZOLIN SODIUM-DEXTROSE 2-4 GM/100ML-% IV SOLN
2.0000 g | Freq: Once | INTRAVENOUS | Status: AC
  Administered 2016-01-24: 2 g via INTRAVENOUS

## 2016-01-24 MED ORDER — SCOPOLAMINE 1 MG/3DAYS TD PT72
MEDICATED_PATCH | TRANSDERMAL | Status: AC
  Administered 2016-01-24: 1.5 mg via TRANSDERMAL
  Filled 2016-01-24: qty 1

## 2016-01-24 MED ORDER — PROPOFOL 10 MG/ML IV BOLUS
INTRAVENOUS | Status: DC | PRN
  Administered 2016-01-24: 180 mg via INTRAVENOUS

## 2016-01-24 MED ORDER — MIDAZOLAM HCL 2 MG/2ML IJ SOLN
INTRAMUSCULAR | Status: DC | PRN
  Administered 2016-01-24: 2 mg via INTRAVENOUS

## 2016-01-24 MED ORDER — OXYCODONE-ACETAMINOPHEN 5-325 MG PO TABS: 1.0000 | ORAL_TABLET | ORAL | Status: DC | PRN

## 2016-01-24 MED ORDER — FAMOTIDINE 20 MG PO TABS
ORAL_TABLET | ORAL | Status: AC
  Filled 2016-01-24: qty 1

## 2016-01-24 MED ORDER — DEXAMETHASONE SODIUM PHOSPHATE 10 MG/ML IJ SOLN
INTRAMUSCULAR | Status: DC | PRN
  Administered 2016-01-24: 10 mg via INTRAVENOUS

## 2016-01-24 MED ORDER — SCOPOLAMINE 1 MG/3DAYS TD PT72
1.0000 | MEDICATED_PATCH | TRANSDERMAL | Status: DC
  Administered 2016-01-24: 1.5 mg via TRANSDERMAL

## 2016-01-24 MED ORDER — LIDOCAINE HCL (CARDIAC) 20 MG/ML IV SOLN
INTRAVENOUS | Status: DC | PRN
  Administered 2016-01-24: 50 mg via INTRAVENOUS

## 2016-01-24 MED ORDER — OXYCODONE HCL 5 MG/5ML PO SOLN: 5.0000 mg | Freq: Once | ORAL | Status: AC | PRN

## 2016-01-24 MED ORDER — FENTANYL CITRATE (PF) 100 MCG/2ML IJ SOLN
INTRAMUSCULAR | Status: AC
  Administered 2016-01-24: 25 ug via INTRAVENOUS
  Filled 2016-01-24: qty 2

## 2016-01-24 MED ORDER — ONDANSETRON HCL 4 MG/2ML IJ SOLN
INTRAMUSCULAR | Status: DC | PRN
  Administered 2016-01-24: 4 mg via INTRAVENOUS

## 2016-01-24 MED ORDER — PHENAZOPYRIDINE HCL 200 MG PO TABS: 200.0000 mg | ORAL_TABLET | Freq: Three times a day (TID) | ORAL | Status: DC | PRN

## 2016-01-24 MED ORDER — LACTATED RINGERS IV SOLN
INTRAVENOUS | Status: DC
  Administered 2016-01-24: 08:00:00 via INTRAVENOUS

## 2016-01-24 MED ORDER — OXYBUTYNIN CHLORIDE 5 MG PO TABS
5.0000 mg | ORAL_TABLET | Freq: Once | ORAL | Status: AC
  Administered 2016-01-24: 5 mg via ORAL
  Filled 2016-01-24: qty 1

## 2016-01-24 MED ORDER — TAMSULOSIN HCL 0.4 MG PO CAPS
0.4000 mg | ORAL_CAPSULE | Freq: Once | ORAL | Status: AC
  Administered 2016-01-24: 0.4 mg via ORAL
  Filled 2016-01-24: qty 1

## 2016-01-24 MED ORDER — OXYCODONE HCL 5 MG PO TABS
5.0000 mg | ORAL_TABLET | Freq: Once | ORAL | Status: AC | PRN
  Administered 2016-01-24: 5 mg via ORAL

## 2016-01-24 MED ORDER — PHENAZOPYRIDINE HCL 200 MG PO TABS
200.0000 mg | ORAL_TABLET | Freq: Once | ORAL | Status: AC
  Administered 2016-01-24: 200 mg via ORAL
  Filled 2016-01-24: qty 1

## 2016-01-24 MED ORDER — TAMSULOSIN HCL 0.4 MG PO CAPS: 0.4000 mg | ORAL_CAPSULE | Freq: Every day | ORAL | Status: DC

## 2016-01-24 MED ORDER — FENTANYL CITRATE (PF) 100 MCG/2ML IJ SOLN
INTRAMUSCULAR | Status: DC | PRN
  Administered 2016-01-24: 100 ug via INTRAVENOUS

## 2016-01-24 MED ORDER — FENTANYL CITRATE (PF) 100 MCG/2ML IJ SOLN
25.0000 ug | INTRAMUSCULAR | Status: DC | PRN
  Administered 2016-01-24 (×2): 25 ug via INTRAVENOUS

## 2016-01-24 MED ORDER — OXYCODONE HCL 5 MG PO TABS
ORAL_TABLET | ORAL | Status: AC
  Filled 2016-01-24: qty 1

## 2016-01-24 MED ORDER — PHENYLEPHRINE HCL 10 MG/ML IJ SOLN
INTRAMUSCULAR | Status: DC | PRN
  Administered 2016-01-24 (×3): 100 ug via INTRAVENOUS

## 2016-01-24 MED ORDER — SUGAMMADEX SODIUM 200 MG/2ML IV SOLN
INTRAVENOUS | Status: DC | PRN
  Administered 2016-01-24: 200 mg via INTRAVENOUS

## 2016-01-24 MED ORDER — ROCURONIUM BROMIDE 100 MG/10ML IV SOLN
INTRAVENOUS | Status: DC | PRN
  Administered 2016-01-24: 35 mg via INTRAVENOUS

## 2016-01-24 MED ORDER — FAMOTIDINE 20 MG PO TABS
20.0000 mg | ORAL_TABLET | Freq: Once | ORAL | Status: AC
  Administered 2016-01-24: 20 mg via ORAL

## 2016-01-24 MED ORDER — OXYBUTYNIN CHLORIDE 5 MG/5ML PO SYRP
5.0000 mg | ORAL_SOLUTION | Freq: Once | ORAL | Status: DC
  Filled 2016-01-24: qty 5

## 2016-01-24 SURGICAL SUPPLY — 36 items
ADAPTER SCOPE UROLOK II (MISCELLANEOUS) IMPLANT
ADPR INSRT BALL FIT URLK2 (MISCELLANEOUS)
BAG DRAIN CYSTO-URO LG1000N (MISCELLANEOUS) ×3 IMPLANT
BASKET ZERO TIP 1.9FR (BASKET) IMPLANT
BSKT STON RTRVL ZERO TP 1.9FR (BASKET)
CATH FOL 2WAY LX 16X5 (CATHETERS) IMPLANT
CATH URETL 5X70 OPEN END (CATHETERS) ×3 IMPLANT
CNTNR SPEC 2.5X3XGRAD LEK (MISCELLANEOUS) ×2
CONRAY 43 FOR UROLOGY 50M (MISCELLANEOUS) ×3 IMPLANT
CONT SPEC 4OZ STER OR WHT (MISCELLANEOUS) ×1
CONT SPEC 4OZ STRL OR WHT (MISCELLANEOUS) ×2
CONTAINER SPEC 2.5X3XGRAD LEK (MISCELLANEOUS) ×2 IMPLANT
DRAPE UTILITY 15X26 TOWEL STRL (DRAPES) ×3 IMPLANT
FEE TECHNICIAN ONLY PER HOUR (MISCELLANEOUS) ×1 IMPLANT
GLOVE BIO SURGEON STRL SZ 6.5 (GLOVE) ×3 IMPLANT
GOWN STRL REUS W/ TWL LRG LVL4 (GOWN DISPOSABLE) ×4 IMPLANT
GOWN STRL REUS W/TWL LRG LVL4 (GOWN DISPOSABLE) ×6
HOLDER FOLEY CATH W/STRAP (MISCELLANEOUS) IMPLANT
INTRODUCER DILATOR DOUBLE (INTRODUCER) ×2 IMPLANT
KIT RM TURNOVER CYSTO AR (KITS) ×3 IMPLANT
LASER FIBER 200M SMARTSCOPE (Laser) IMPLANT
PACK CYSTO AR (MISCELLANEOUS) ×3 IMPLANT
PUMP SINGLE ACTION SAP (PUMP) IMPLANT
SENSORWIRE 0.038 NOT ANGLED (WIRE) ×3
SET CYSTO W/LG BORE CLAMP LF (SET/KITS/TRAYS/PACK) ×3 IMPLANT
SET DILATOR URETRAL 8.5FR (MISCELLANEOUS) ×2 IMPLANT
SHEATH URETERAL 12FRX35CM (MISCELLANEOUS) IMPLANT
SOL .9 NS 3000ML IRR  AL (IV SOLUTION) ×1
SOL .9 NS 3000ML IRR AL (IV SOLUTION) ×2
SOL .9 NS 3000ML IRR UROMATIC (IV SOLUTION) ×2 IMPLANT
STENT URET 6FRX24 CONTOUR (STENTS) ×2 IMPLANT
STENT URET 6FRX26 CONTOUR (STENTS) IMPLANT
SURGILUBE 2OZ TUBE FLIPTOP (MISCELLANEOUS) ×3 IMPLANT
SYRINGE IRR TOOMEY STRL 70CC (SYRINGE) ×3 IMPLANT
WATER STERILE IRR 1000ML POUR (IV SOLUTION) ×3 IMPLANT
WIRE SENSOR 0.038 NOT ANGLED (WIRE) ×2 IMPLANT

## 2016-01-24 NOTE — Anesthesia Procedure Notes (Signed)
Procedure Name: Intubation Performed by: Rolla Plate Pre-anesthesia Checklist: Patient identified, Patient being monitored, Timeout performed, Emergency Drugs available and Suction available Patient Re-evaluated:Patient Re-evaluated prior to inductionOxygen Delivery Method: Circle system utilized Preoxygenation: Pre-oxygenation with 100% oxygen Intubation Type: IV induction Ventilation: Mask ventilation without difficulty Laryngoscope Size: Miller and 2 Grade View: Grade I Tube type: Oral Tube size: 7.0 mm Number of attempts: 1 Placement Confirmation: ETT inserted through vocal cords under direct vision,  positive ETCO2 and breath sounds checked- equal and bilateral Secured at: 21 cm Tube secured with: Tape Dental Injury: Teeth and Oropharynx as per pre-operative assessment

## 2016-01-24 NOTE — Anesthesia Postprocedure Evaluation (Signed)
Anesthesia Post Note  Patient: Kathryn Lozano  Procedure(s) Performed: Procedure(s): URETEROSCOPY CYSTOSCOPY WITH [place]  Patient location during evaluation: PACU Anesthesia Type: General Level of consciousness: awake and alert Pain management: pain level controlled Vital Signs Assessment: post-procedure vital signs reviewed and stable Respiratory status: spontaneous breathing, nonlabored ventilation, respiratory function stable and patient connected to nasal cannula oxygen Cardiovascular status: blood pressure returned to baseline and stable Postop Assessment: no signs of nausea or vomiting Anesthetic complications: no    Last Vitals:  Filed Vitals:   01/24/16 1115 01/24/16 1210  BP: 119/78 107/70  Pulse: 57 62  Temp: 36.7 C 36.9 C  Resp: 16 16    Last Pain:  Filed Vitals:   01/24/16 1213  PainSc: 4                  [place] John K Piscitello

## 2016-01-24 NOTE — Interval H&P Note (Signed)
History and Physical Interval Note:  01/24/2016 7:40 AM  Kathryn Lozano  has presented today for surgery, with the diagnosis of LEFT NEPHROLITHIASIS  The various methods of treatment have been discussed with the patient and family. After consideration of risks, benefits and other options for treatment, the patient has consented to  Procedure(s): URETEROSCOPY WITH HOLMIUM LASER LITHOTRIPSY (Left) CYSTOSCOPY WITH STENT PLACEMENT (Left) as a surgical intervention .  The patient's history has been reviewed, patient examined, no change in status, stable for surgery.  I have reviewed the patient's chart and labs.  Questions were answered to the patient's satisfaction.    RRR CTAB  Hollice Espy

## 2016-01-24 NOTE — Discharge Instructions (Signed)
You have a ureteral stent in place.  This is a tube that extends from your kidney to your bladder.  This may cause urinary bleeding, burning with urination, and urinary frequency.  Please call our office or present to the ED if you develop fevers >101 or pain which is not able to be controlled with oral pain medications.  You may be given either Flomax and/ or ditropan to help with bladder spasms and stent pain in addition to pain medications.   ° °[facility] °[address], [address] °[place], [place] [address] °[phone] ° ° ° °AMBULATORY SURGERY  °DISCHARGE INSTRUCTIONS ° ° °1) The drugs that you were given will stay in your system until tomorrow so for the next 24 hours you should not: ° °A) Drive an automobile °B) Make any legal decisions °C) Drink any alcoholic beverage ° ° °2) You may resume regular meals tomorrow.  Today it is better to start with liquids and gradually work up to solid foods. ° °You may eat anything you prefer, but it is better to start with liquids, then soup and crackers, and gradually work up to solid foods. ° ° °3) Please notify your doctor immediately if you have any unusual bleeding, trouble breathing, redness and pain at the surgery site, drainage, fever, or pain not relieved by medication. ° ° ° °4) Additional Instructions: ° ° ° ° ° ° ° °Please contact your physician with any problems or Same Day Surgery at [phone], Monday through Friday 6 am to 4 pm, or [place] [facility] number at [phone]. °

## 2016-01-24 NOTE — Transfer of Care (Signed)
Immediate Anesthesia Transfer of Care Note  Patient: Kathryn Lozano  Procedure(s) Performed: Procedure(s): URETEROSCOPY CYSTOSCOPY WITH STENT PLACEMENT  Patient Location: PACU  Anesthesia Type:General  Level of Consciousness: awake  Airway & Oxygen Therapy: Patient Spontanous Breathing and Patient connected to face mask oxygen  Post-op Assessment: Report given to RN and Post -op Vital signs reviewed and stable  Post vital signs: Reviewed  Last Vitals:  Filed Vitals:   01/24/16 0957 01/24/16 0958  BP: 108/73 108/73  Pulse: 77 81  Temp: 36.9 C 36.9 C  Resp: 20 13    Last Pain: There were no vitals filed for this visit.       Complications: No apparent anesthesia complications

## 2016-01-24 NOTE — Anesthesia Preprocedure Evaluation (Signed)
Anesthesia Evaluation  Patient identified by MRN, date of birth, ID band Patient awake    Reviewed: Allergy & Precautions, H&P , NPO status , Patient's Chart, lab work & pertinent test results  History of Anesthesia Complications (+) PONV and history of anesthetic complications  Airway Mallampati: II  TM Distance: >3 FB Neck ROM: full    Dental  (+) Poor Dentition   Pulmonary neg pulmonary ROS, neg shortness of breath,    Pulmonary exam normal breath sounds clear to auscultation       Cardiovascular Exercise Tolerance: Good (-) angina(-) Past MI and (-) DOE negative cardio ROS Normal cardiovascular exam Rhythm:regular Rate:Normal     Neuro/Psych  Headaches, PSYCHIATRIC DISORDERS Anxiety    GI/Hepatic Neg liver ROS, GERD  Controlled and Medicated,  Endo/Other  negative endocrine ROS  Renal/GU Renal disease  negative genitourinary   Musculoskeletal   Abdominal   Peds  Hematology negative hematology ROS (+)   Anesthesia Other Findings Past Medical History:   Migraines                                                    ADHD (attention deficit hyperactivity disorder)              Anxiety                                                      Renal disorder                                                 Comment:KIDNEY STONES   PONV (postoperative nausea and vomiting)                       Comment:Pt reports nausea, "sensitive stomach"   Heartburn                                                   Past Surgical History:   LITHOTRIPSY                                      2005         WISDOM TOOTH EXTRACTION                                       CHOLECYSTECTOMY                                 N/A 09/14/2014      Comment:Procedure: LAPAROSCOPIC CHOLECYSTECTOMY;                Surgeon: Coralie Keens, MD;  Location: [facility]  OR;  Service: General;  Laterality: N/A;  BMI    Body Mass Index   30.83 kg/m 2       Reproductive/Obstetrics negative OB ROS                             Anesthesia Physical Anesthesia Plan  ASA: III  Anesthesia Plan: General ETT   Post-op Pain Management:    Induction:   Airway Management Planned:   Additional Equipment:   Intra-op Plan:   Post-operative Plan:   Informed Consent: I have reviewed the patients History and Physical, chart, labs and discussed the procedure including the risks, benefits and alternatives for the proposed anesthesia with the patient or authorized representative who has indicated his/her understanding and acceptance.   Dental Advisory Given  Plan Discussed with: Anesthesiologist, CRNA and Surgeon  Anesthesia Plan Comments:         Anesthesia Quick Evaluation

## 2016-01-24 NOTE — Op Note (Signed)
Date of procedure: 01/24/2016  Preoperative diagnosis:  1. Left nephrolithiasis   Postoperative diagnosis:  1. Same as above   Procedure: 1. Attempted left ureteroscopy 2. Left ureteral stent placement 3. Left retrograde pyelogram 4. Cystoscopy  Surgeon: Hollice Espy, MD  Anesthesia: General  Complications: None  Intaraoperative findings: Unable to advance scope beyond left iliac vessels despite gentle dilation. Stent placed with plans to return for staged procedure.  EBL: Minimal  Specimens: None  Drains: 24 French double-J ureteral stent on left  Indication: Kathryn Lozano is a 30 y.o. 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  After reviewing the management options for treatment, she elected to proceed with the above surgical procedure(s). We have discussed the potential benefits and risks of the procedure, side effects of the proposed treatment, the likelihood of the patient achieving the goals of the procedure, and any potential problems that might occur during the procedure or recuperation. Informed consent has been obtained.  Description of procedure:  The patient was taken to the operating room and general anesthesia was induced.  The patient was placed in the dorsal lithotomy position, prepped and draped in the usual sterile fashion, and preoperative antibiotics were administered. A preoperative time-out was performed.   A 21 French rigid cystoscope was advanced per urethra into the bladder. Attention was turned to the left ureteral orifice which was cannulated using a 5 [place] open-ended ureteral catheter and a gentle retrograde pyelogram was performed. This revealed a delicate decompressed appearing ureter up to level of the kidney within normal collecting system without any obvious filling defects. A sensor wire was then placed up to level of the kidney without difficulty. A  second Super Stiff wire was introduced using the same technique up to level of the kidney. I then attempted to advance a 7 French flexible ureteroscope up to level of the kidney but met resistance at the level of the iliacs. I then attempted to gently dilate this by Shiley advancing a 5 [place] open-ended ureteral catheter beyond this area which was quite tight but successful. I then attempted to place the inner sheath of an 8/10 dilator but was unsuccessful in advancing the 8 [place] beyond the iliacs. At this point, in order to avoid any ureteral trauma or scar tissue formation, I elected to place a stent and return at a later date for staged procedure after allowing for passive dilation. The Super Stiff wire was removed and a 6 x 24 French double-J ureteral stent was placed over the sensor wire up to level of the kidney. The wire was partially withdrawn until coil was noted within the upper tract. The wire was then fully withdrawn and a full coil was noted within the bladder. The bladder was then drained using the cystoscope sheath. She was then repositioned the supine position, reversed from anesthesia, and taken to the PACU in stable condition.  Plan: Patient will return next week to the operating room for a staged procedure.  Hollice Espy, M.D.

## 2016-01-24 NOTE — H&P (View-Only) (Signed)
1:39 PM   Kathryn Lozano Feb 21, 1986 [id number]  Referring provider: Susy Frizzle, MD [address] [place],  [address]  Chief Complaint  Patient presents with  . Nephrolithiasis    4 week follow up    HPI: Patient is a 30 year old Caucasian female who presents today for follow up for a 4 mm distal right ureteral calculus.   Background history Patient was a referral for a right distal ureteral calculus with mild hydronephrosis.  She states that she was having a dull aching in her pelvis that she attributed to her IUD.  The pain then became intense and then radiated to her right flank area.  She had difficulty urinating and was having gross hematuria.  She was seen at an urgent care and diagnosed with an UTI.  The next day the pain become even worse and she couldn't void.  She then sought treatment in the ED.   Patient was given IV analgesics and IV antibiotics.  A CT Renal stone study noted a right 4 mm distal ureteral calculus with mild hydronephrosis.  She was also found to have small non obstructing bilateral renal calculi.  She was discharged with Percocet and instructed to follow up with urology. She has a prior history of stones.  She had her first stone when she was 51 for which she had ESWL and passed 2 to 3 stones already this year.  Her stone composition is unknown.  She has not had a metabolic work up.    We saw the patient on 12/13/2015 and she felt that she may have passed the stone two weeks later.  A KUB did not identify a stone and her follow up RUS did not demonstrate any hydronephrosis and bilateral ureteral jets.  Today, she is not having any urinary symptoms.  She is not having flank pain.  Her UA is clear.  She's not had recent fevers, chills, nausea or vomiting.    She does have stones in her left kidney and she and her husband are attempting to conceive and she would like to decrease her stone burden before becoming pregnant.    PMH: Past  Medical History  Diagnosis Date  . Migraines   . ADHD (attention deficit hyperactivity disorder)   . Anxiety   . Renal disorder     KIDNEY STONES  . PONV (postoperative nausea and vomiting)     Pt reports nausea, "sensitive stomach"  . Heartburn     Surgical History: Past Surgical History  Procedure Laterality Date  . Lithotripsy  2005  . Wisdom tooth extraction    . Cholecystectomy N/A 09/14/2014    Procedure: LAPAROSCOPIC CHOLECYSTECTOMY;  Surgeon: Coralie Keens, MD;  Location: [facility];  Service: General;  Laterality: N/A;    Home Medications:    Medication List       This list is accurate as of: 01/22/16  1:39 PM.  Always use your most recent med list.               acetaminophen 500 MG tablet  Commonly known as:  TYLENOL  Take 1,000 mg by mouth every 8 (eight) hours as needed (migraine headache).     ALEVE PO  Take 2 tablets by mouth daily as needed (headache).     ALPRAZolam 0.5 MG tablet  Commonly known as:  XANAX  Take 1 tablet (0.5 mg total) by mouth every 8 (eight) hours as needed for sleep.  methylphenidate 36 MG CR tablet  Commonly known as:  CONCERTA  Take 1 tablet (36 mg total) by mouth daily after breakfast.     montelukast 10 MG tablet  Commonly known as:  SINGULAIR  Take 1 tablet by mouth at  bedtime     omeprazole 40 MG capsule  Commonly known as:  PRILOSEC  Take 1 capsule (40 mg total) by mouth daily.     ondansetron 4 MG tablet  Commonly known as:  ZOFRAN  Take 1 tablet (4 mg total) by mouth every 8 (eight) hours as needed for nausea or vomiting.     oxyCODONE-acetaminophen 5-325 MG tablet  Commonly known as:  ROXICET  Take 1-2 tablets by mouth every 4 (four) hours as needed for severe pain.     prenatal multivitamin Tabs tablet  Take 1 tablet by mouth daily at 12 noon.     ranitidine 150 MG tablet  Commonly known as:  ZANTAC  Take 1 tablet by mouth at night     topiramate 100 MG tablet  Commonly known as:  TOPAMAX  Take 1  tablet (100 mg total) by mouth 2 (two) times daily.        Allergies: No Known Allergies  Family History: Family History  Problem Relation Age of Onset  . Kidney cancer Neg Hx   . Bladder Cancer Neg Hx   . Alzheimer's disease Paternal Uncle   . Thyroid cancer Paternal Uncle     Social History:  reports that she has never smoked. She has never used smokeless tobacco. She reports that she drinks alcohol. She reports that she does not use illicit drugs.  ROS: UROLOGY Frequent Urination?: No Hard to postpone urination?: No Burning/pain with urination?: No Get up at night to urinate?: No Leakage of urine?: No Urine stream starts and stops?: No Trouble starting stream?: No Do you have to strain to urinate?: No Blood in urine?: No Urinary tract infection?: No Sexually transmitted disease?: No Injury to kidneys or bladder?: No Painful intercourse?: Yes Weak stream?: No Currently pregnant?: No Vaginal bleeding?: No Last menstrual period?: n  Gastrointestinal Nausea?: No Vomiting?: No Indigestion/heartburn?: No Diarrhea?: No Constipation?: No  Constitutional Fever: No Night sweats?: No Weight loss?: No Fatigue?: Yes  Skin Skin rash/lesions?: No Itching?: No  Eyes Blurred vision?: No Double vision?: No  Ears/Nose/Throat Sore throat?: No Sinus problems?: Yes  Hematologic/Lymphatic Swollen glands?: No Easy bruising?: No  Cardiovascular Leg swelling?: No Chest pain?: No  Respiratory Cough?: No Shortness of breath?: No  Endocrine Excessive thirst?: No  Musculoskeletal Back pain?: No Joint pain?: No  Neurological Headaches?: No Dizziness?: No  Psychologic Depression?: No Anxiety?: No  Physical Exam: BP 125/84 mmHg  Pulse 77  Ht 5\' 3"  (1.6 m)  Wt 174 lb 12.8 oz (79.289 kg)  BMI 30.97 kg/m2  LMP 12/26/2015  Constitutional: Well nourished. Alert and oriented, No acute distress. HEENT: [place] AT, moist mucus membranes. Trachea midline, no  masses. Cardiovascular: No clubbing, cyanosis, or edema. Respiratory: Normal respiratory effort, no increased work of breathing. GI: Abdomen is soft, non tender, non distended, no abdominal masses. Liver and spleen not palpable.  No hernias appreciated.  Stool sample for occult testing is not indicated.   GU: No CVA tenderness.  No bladder fullness or masses.   Skin: No rashes, bruises or suspicious lesions. Lymph: No cervical or inguinal adenopathy. Neurologic: Grossly intact, no focal deficits, moving all 4 extremities. Psychiatric: Normal mood and affect.  Laboratory Data: Lab Results  Component Value Date   WBC 9.4 12/09/2015   HGB 13.3 12/09/2015   HCT 40.7 12/09/2015   MCV 84.0 12/09/2015   PLT 216 12/09/2015    Lab Results  Component Value Date   CREATININE 0.79 12/09/2015     Lab Results  Component Value Date   TSH 2.346 02/03/2014       Component Value Date/Time   CHOL 204* 02/03/2014 1132   HDL 57 02/03/2014 1132   CHOLHDL 3.6 02/03/2014 1132   VLDL 12 02/03/2014 1132   LDLCALC 135* 02/03/2014 1132    Lab Results  Component Value Date   AST 15 03/06/2014   Lab Results  Component Value Date   ALT 18 03/06/2014    Urinalysis Results for orders placed or performed in visit on 01/22/16  Microscopic Examination  Result Value Ref Range   WBC, UA None seen 0 -  5 /hpf   RBC, UA 0-2 0 -  2 /hpf   Epithelial Cells (non renal) 0-10 0 - 10 /hpf   Mucus, UA Present (A) Not Estab.   Bacteria, UA None seen None seen/Few  Urinalysis, Complete  Result Value Ref Range   Specific Gravity, UA 1.020 1.005 - 1.030   pH, UA 6.5 5.0 - 7.5   Color, UA Yellow Yellow   Appearance Ur Clear Clear   Leukocytes, UA Negative Negative   Protein, UA Negative Negative/Trace   Glucose, UA Negative Negative   Ketones, UA Negative Negative   RBC, UA Negative Negative   Bilirubin, UA Negative Negative   Urobilinogen, Ur 0.2 0.2 - 1.0 mg/dL   Nitrite, UA Negative Negative    Microscopic Examination See below:     Pertinent Imaging: CLINICAL DATA: 30 year old female with right flank pain. History of kidney stones.  EXAM: CT ABDOMEN AND PELVIS WITHOUT CONTRAST  TECHNIQUE: Multidetector CT imaging of the abdomen and pelvis was performed following the standard protocol without IV contrast.  COMPARISON: CT dated 03/06/2014  FINDINGS: Evaluation of this exam is limited in the absence of intravenous contrast.  The visualized lung bases are clear. No intra-abdominal free air or free fluid identified.  Cholecystectomy. The liver, pancreas, spleen, and adrenal glands appear unremarkable. There is a 4 mm calculus in the distal right ureter adjacent to the right UVJ with mild right hydronephrosis. A 2 mm nonobstructing right renal interpolar calculus noted. Multiple nonobstructing left renal calculi measuring up to 5 mm in the inferior pole of the left kidney. There is no hydronephrosis on the left. The visualized left ureter appears unremarkable. The urinary bladder is grossly unremarkable.  The uterus is anteverted. An intrauterine device is noted. A 2 cm hypodense structure in the left ovary may represent normal ovarian tissue versus a dominant follicle/corpus luteum.  Evaluation of the bowel is limited in the absence of oral contrast. There is no evidence of bowel obstruction or active inflammation. Normal appendix.  The abdominal aorta and IVC appear grossly unremarkable on this noncontrast study. No portal venous gas identified. There is no adenopathy. The abdominal wall soft tissues are grossly unremarkable. A small fat containing umbilical hernia noted. There bilateral L5 pars defects with grade 1 L5-S1 anterolisthesis. No acute fracture.  IMPRESSION: A 4 mm distal right ureteral calculus with mild right hydronephrosis.  Small nonobstructing bilateral renal calculi. There is no hydronephrosis on the left.   Electronically  Signed  By: Anner Crete M.D.  On: 12/09/2015 19:38       CLINICAL DATA: Kidney stones  EXAM:  RENAL / URINARY TRACT ULTRASOUND COMPLETE  COMPARISON: CT scan 12/09/2015  FINDINGS: Right Kidney:  Length: 11.7 cm. Echogenicity within normal limits. No mass or hydronephrosis visualized.  Left Kidney:  Length: 10.7 cm. Echogenicity within normal limits. No mass or hydronephrosis visualized. Shadowing calculus in mid to lower pole of the left kidney measures 5 mm.  Bladder:  Appears normal for degree of bladder distention. Bilateral ureteral jets are visualized.  IMPRESSION: No hydronephrosis. Calculus in mid to lower pole of the left kidney measures 5 mm nonobstructive. Unremarkable urinary bladder. Bilateral ureteral jets.   Electronically Signed  By: Lahoma Crocker M.D.  On: 01/18/2016 14:23   Assessment & Plan:    Patient will undergo left ureteroscopy with laser lithotripsy with ureteral stent placement for definitive treatment for her left renal stones.    1. Left renal stone:   Patient would like to pursue definitive treatment of her left renal stones as she is trying to conceive and would like to decrease her stone burden prior to becoming pregnant.  I stated her best option would be URS/LL/ureteral stent placement.  I explained to the patient how the procedure is performed and the risks involved.  I informed patient that she will have a stent placed during the procedure and will remain in place after the procedure for a short time.  It will be removed in the office with a cystoscope, unless a string in left in place.  I informed that patient that about 50% of patients who undergo ureteroscopy and have a stent will have "stent pain," and this is by far the most common risk/complaint following ureteroscopy. A stent is a soft plastic tube (about half the size of IV tubing) that allows the kidney to drain to the bladder regardless of edema or  obstruction. Not only can the stent "rub" on the inside of the bladder, causing a feeling of needing to urinate/overactive bladder, but also the stent allows urine to pass up from the bladder to the kidney during urination - causing symptoms from a warm, tingling sensation to intense pain in the affected flank.  They may be residual stones within the kidney or ureter may be present up to 40% of the time following ureteroscopy, depending on the original stone size and location. These stone fragments will be seen and addressed on follow-up imaging.  Injury to the ureter is the most common intra-operative complication during ureteroscopy. The reported risk of perforation ranges greatly, depending on whether it is defined as a complete perforation (0.1-0.7% - think of this as a hole through the entire ureter), a partial perforation (1.6% - a hole nearly through the entire ureter), or mucosal tear/scrape (5% - these are similar to a sore on the inside of the mouth). Almost 100% of these will heal with prolonged stenting (anywhere between 2 - 4 weeks). Should a large perforation occur, your urologist may chose to stop the procedure and return on another day when the ureter has had time to heal.  2. Right ureteral stone:   Patient has most likely passed the right ureteral stone as she is not having any further flank pain, hematuria, it is not visible on KUB and the RUS was normal.     - Urinalysis, Complete - CULTURE, URINE COMPREHENSIVE - BMP - hCG, serum qualitative - CBC with Differential/Platelet  3. Bilateral nephrolithiasis:   Patient is interested in a metabolic work up.  We will obtain the study once she has recovered from her left URS/LL/ureteral stent  placement and removal.      4. Right hydronephrosis:   The right hydronephrosis has resolved.    5. Microscopic hematuria:   We will continue to monitor the patient's UA after the treatment/passage of the stone to ensure the hematuria has resolved.  If  hematuria persists, we will pursue a hematuria workup with CT Urogram and cystoscopy if appropriate.      Return for schedule left URS/LL/ureteral stent placement.  These notes generated with voice recognition software. I apologize for typographical errors.  Zara Council, [facility] [address], [place], [place] [address] [phone]

## 2016-01-25 ENCOUNTER — Inpatient Hospital Stay: Admission: RE | Admit: 2016-01-25 | Payer: 59 | Source: Ambulatory Visit

## 2016-01-25 ENCOUNTER — Encounter: Payer: Self-pay | Admitting: *Deleted

## 2016-01-25 ENCOUNTER — Telehealth: Payer: Self-pay | Admitting: Radiology

## 2016-01-25 ENCOUNTER — Telehealth: Payer: Self-pay

## 2016-01-25 MED ORDER — OXYCODONE-ACETAMINOPHEN 5-325 MG PO TABS: 1.0000 | ORAL_TABLET | ORAL | Status: DC | PRN

## 2016-01-25 NOTE — Telephone Encounter (Signed)
Spoke with pt in reference to itching and pain medication. Pt stated that she has been taking benadryl. Reinforced with pt if need be can alternate ibuprofen and tylenol. Pt voiced understanding.

## 2016-01-25 NOTE — Telephone Encounter (Signed)
Pt called stating that she has taken 2 doses of roxicet. Her first dose was last night and over night she started itching all over. Pt stated she took another dose this morning and now can not stop itching.

## 2016-01-25 NOTE — Telephone Encounter (Signed)
Script up front.    Kathryn Espy, MD

## 2016-01-25 NOTE — Telephone Encounter (Signed)
Notified pt of surgery scheduled 01/29/16, pre-admit phone interview 01/25/16 between 1-5pm & to call day prior to surgery for arrival time to [facility]. Pt voices understanding. Pt c/o severe flank pain that is tolerated only if she takes 2 pain pills. Advised pt to continue pain meds & to go to the ER if it becomes unbearable. Pt requests refill of oxyCODONE-acetaminophen (ROXICET) 5-325 MG tablet.

## 2016-01-25 NOTE — Patient Instructions (Signed)
  Your procedure is scheduled on: 01-29-16 Report to Same Day Surgery 2nd floor medical mall To find out your arrival time please call [phone] between 1PM - 3PM on 01-28-16  Remember: Instructions that are not followed completely may result in serious medical risk, up to and including death, or upon the discretion of your surgeon and anesthesiologist your surgery may need to be rescheduled.    _x___ 1. Do not eat food or drink liquids after midnight. No gum chewing or hard candies.     __x__ 2. No Alcohol for 24 hours before or after surgery.   __x__3. No Smoking for 24 prior to surgery.   ____  4. Bring all medications with you on the day of surgery if instructed.    __x__ 5. Notify your doctor if there is any change in your medical condition     (cold, fever, infections).     Do not wear jewelry, make-up, hairpins, clips or nail polish.  Do not wear lotions, powders, or perfumes. You may wear deodorant.  Do not shave 48 hours prior to surgery. Men may shave face and neck.  Do not bring valuables to the hospital.    [facility] is not responsible for any belongings or valuables.               Contacts, dentures or bridgework may not be worn into surgery.  Leave your suitcase in the car. After surgery it may be brought to your room.  For patients admitted to the hospital, discharge time is determined by your treatment team.   Patients discharged the day of surgery will not be allowed to drive home.    Please read over the following fact sheets that you were given:   [facility] Preparing for Surgery and or MRSA Information   _x___ Take these medicines the morning of surgery with A SIP OF WATER:    1. flomax  2. singulair  3. zantac  4. Take an extra zantac on Monday night before bed  5.  6.  ____ Fleet Enema (as directed)   ____ Use CHG Soap or sage wipes as directed on instruction sheet   ____ Use inhalers on the day of surgery and bring to hospital day of  surgery  ____ Stop metformin 2 days prior to surgery    ____ Take 1/2 of usual insulin dose the night before surgery and none on the morning of surgery.   ____ Stop aspirin or coumadin, or plavix  _x__ Stop Anti-inflammatories such as Advil, Aleve, Ibuprofen, Motrin, Naproxen,          Naprosyn, Goodies powders or aspirin products NOW. Ok to take Tylenol.   ____ Stop supplements until after surgery.    ____ Bring C-Pap to the hospital.

## 2016-01-25 NOTE — Telephone Encounter (Signed)
Take benadryl.  Hollice Espy, MD

## 2016-01-29 MED ORDER — SODIUM CHLORIDE 0.9 % IV SOLN
1.0000 g | Freq: Once | INTRAVENOUS | Status: AC
  Administered 2016-01-30: 1 g via INTRAVENOUS

## 2016-01-29 MED ORDER — GENTAMICIN IN SALINE 1.6-0.9 MG/ML-% IV SOLN
80.0000 mg | Freq: Once | INTRAVENOUS | Status: DC
  Filled 2016-01-29: qty 50

## 2016-01-29 MED ORDER — GENTAMICIN SULFATE 40 MG/ML IJ SOLN
80.0000 mg | Freq: Once | INTRAVENOUS | Status: DC
  Filled 2016-01-29: qty 2

## 2016-01-29 MED ORDER — GENTAMICIN IN SALINE 1.6-0.9 MG/ML-% IV SOLN
80.0000 mg | INTRAVENOUS | Status: AC
  Administered 2016-01-30: 80 mg via INTRAVENOUS
  Filled 2016-01-29: qty 50

## 2016-01-29 NOTE — Telephone Encounter (Signed)
Pt's surgery was cancelled on 01/29/16 due to problems in the OR. Notified pt that surgery has been r/s to 01/30/16 with arrival time of 6:00. Pt voices understanding.

## 2016-01-30 ENCOUNTER — Encounter
Admission: RE | Disposition: A | Discharge status: Home / Self Care | Payer: Self-pay | Source: Ambulatory Visit | Attending: Urology

## 2016-01-30 ENCOUNTER — Encounter: Payer: Self-pay | Admitting: *Deleted

## 2016-01-30 ENCOUNTER — Telehealth: Payer: Self-pay

## 2016-01-30 ENCOUNTER — Ambulatory Visit: Payer: 59 | Admitting: Anesthesiology

## 2016-01-30 ENCOUNTER — Ambulatory Visit
Admission: RE | Admit: 2016-01-30 | Discharge: 2016-01-30 | Disposition: A | Discharge status: Home / Self Care | Payer: 59 | Source: Ambulatory Visit | Attending: Urology | Admitting: Urology

## 2016-01-30 DIAGNOSIS — N132 Hydronephrosis with renal and ureteral calculous obstruction: Secondary | ICD-10-CM | POA: Insufficient documentation

## 2016-01-30 DIAGNOSIS — F909 Attention-deficit hyperactivity disorder, unspecified type: Secondary | ICD-10-CM | POA: Insufficient documentation

## 2016-01-30 DIAGNOSIS — Z808 Family history of malignant neoplasm of other organs or systems: Secondary | ICD-10-CM | POA: Diagnosis not present

## 2016-01-30 DIAGNOSIS — F419 Anxiety disorder, unspecified: Secondary | ICD-10-CM | POA: Insufficient documentation

## 2016-01-30 DIAGNOSIS — Z87442 Personal history of urinary calculi: Secondary | ICD-10-CM | POA: Diagnosis not present

## 2016-01-30 DIAGNOSIS — Z9049 Acquired absence of other specified parts of digestive tract: Secondary | ICD-10-CM | POA: Diagnosis not present

## 2016-01-30 DIAGNOSIS — N200 Calculus of kidney: Secondary | ICD-10-CM | POA: Diagnosis not present

## 2016-01-30 DIAGNOSIS — Z818 Family history of other mental and behavioral disorders: Secondary | ICD-10-CM | POA: Insufficient documentation

## 2016-01-30 HISTORY — PX: URETEROSCOPY: SHX842

## 2016-01-30 HISTORY — PX: CYSTOSCOPY W/ URETERAL STENT PLACEMENT: SHX1429

## 2016-01-30 LAB — POCT PREGNANCY, URINE: Preg Test, Ur: NEGATIVE

## 2016-01-30 SURGERY — URETEROSCOPY
Anesthesia: General | Site: Ureter | Laterality: Left | Wound class: Clean Contaminated

## 2016-01-30 MED ORDER — MEPERIDINE HCL 25 MG/ML IJ SOLN: 6.2500 mg | INTRAMUSCULAR | Status: DC | PRN

## 2016-01-30 MED ORDER — FENTANYL CITRATE (PF) 100 MCG/2ML IJ SOLN
25.0000 ug | INTRAMUSCULAR | Status: DC | PRN
  Administered 2016-01-30 (×4): 25 ug via INTRAVENOUS

## 2016-01-30 MED ORDER — MIDAZOLAM HCL 2 MG/2ML IJ SOLN
INTRAMUSCULAR | Status: DC | PRN
Start: 2016-01-30 — End: 2016-01-30
  Administered 2016-01-30: 2 mg via INTRAVENOUS

## 2016-01-30 MED ORDER — SUCCINYLCHOLINE CHLORIDE 20 MG/ML IJ SOLN
INTRAMUSCULAR | Status: DC | PRN
  Administered 2016-01-30: 100 mg via INTRAVENOUS

## 2016-01-30 MED ORDER — KETOROLAC TROMETHAMINE 30 MG/ML IJ SOLN
INTRAMUSCULAR | Status: DC | PRN
  Administered 2016-01-30: 30 mg via INTRAVENOUS

## 2016-01-30 MED ORDER — PROPOFOL 10 MG/ML IV BOLUS
INTRAVENOUS | Status: DC | PRN
  Administered 2016-01-30: 40 mg via INTRAVENOUS
  Administered 2016-01-30: 140 mg via INTRAVENOUS

## 2016-01-30 MED ORDER — IOTHALAMATE MEGLUMINE 43 % IV SOLN
INTRAVENOUS | Status: DC | PRN
  Administered 2016-01-30: 30 mL via URETHRAL

## 2016-01-30 MED ORDER — ONDANSETRON HCL 4 MG/2ML IJ SOLN
INTRAMUSCULAR | Status: DC | PRN
  Administered 2016-01-30: 4 mg via INTRAVENOUS

## 2016-01-30 MED ORDER — FENTANYL CITRATE (PF) 100 MCG/2ML IJ SOLN
INTRAMUSCULAR | Status: AC
  Administered 2016-01-30: 25 ug via INTRAVENOUS
  Filled 2016-01-30: qty 2

## 2016-01-30 MED ORDER — LIDOCAINE HCL (CARDIAC) 20 MG/ML IV SOLN
INTRAVENOUS | Status: DC | PRN
  Administered 2016-01-30: 40 mg via INTRAVENOUS

## 2016-01-30 MED ORDER — DEXAMETHASONE SODIUM PHOSPHATE 10 MG/ML IJ SOLN
INTRAMUSCULAR | Status: DC | PRN
  Administered 2016-01-30: 8 mg via INTRAVENOUS

## 2016-01-30 MED ORDER — OXYCODONE HCL 5 MG/5ML PO SOLN: 5.0000 mg | Freq: Once | ORAL | Status: DC | PRN

## 2016-01-30 MED ORDER — ROCURONIUM BROMIDE 100 MG/10ML IV SOLN
INTRAVENOUS | Status: DC | PRN
  Administered 2016-01-30: 5 mg via INTRAVENOUS
  Administered 2016-01-30: 7 mg via INTRAVENOUS
  Administered 2016-01-30: 25 mg via INTRAVENOUS

## 2016-01-30 MED ORDER — FENTANYL CITRATE (PF) 100 MCG/2ML IJ SOLN
INTRAMUSCULAR | Status: DC | PRN
  Administered 2016-01-30: 100 ug via INTRAVENOUS

## 2016-01-30 MED ORDER — SUGAMMADEX SODIUM 200 MG/2ML IV SOLN
INTRAVENOUS | Status: DC | PRN
  Administered 2016-01-30: 157.8 mg via INTRAVENOUS

## 2016-01-30 MED ORDER — SODIUM CHLORIDE 0.9 % IV SOLN
INTRAVENOUS | Status: AC
  Filled 2016-01-30: qty 1000

## 2016-01-30 MED ORDER — PROMETHAZINE HCL 25 MG/ML IJ SOLN: 6.2500 mg | INTRAMUSCULAR | Status: DC | PRN

## 2016-01-30 MED ORDER — PHENAZOPYRIDINE HCL 200 MG PO TABS: 200.0000 mg | ORAL_TABLET | Freq: Three times a day (TID) | ORAL | 0 refills | Status: DC | PRN

## 2016-01-30 MED ORDER — OXYCODONE HCL 5 MG PO TABS: 5.0000 mg | ORAL_TABLET | Freq: Once | ORAL | Status: DC | PRN

## 2016-01-30 MED ORDER — OXYCODONE HCL 5 MG PO TABS: 5.0000 mg | ORAL_TABLET | Freq: Once | ORAL | 0 refills | Status: DC | PRN

## 2016-01-30 MED ORDER — LACTATED RINGERS IV SOLN
INTRAVENOUS | Status: DC
  Administered 2016-01-30 (×2): via INTRAVENOUS

## 2016-01-30 SURGICAL SUPPLY — 36 items
ADAPTER SCOPE UROLOK II (MISCELLANEOUS) IMPLANT
ADPR INSRT BALL FIT URLK2 (MISCELLANEOUS)
BAG DRAIN CYSTO-URO LG1000N (MISCELLANEOUS) ×4 IMPLANT
BASKET ZERO TIP 1.9FR (BASKET) ×2 IMPLANT
BSKT STON RTRVL ZERO TP 1.9FR (BASKET) ×3
CATH FOL 2WAY LX 16X5 (CATHETERS) IMPLANT
CATH URETL 5X70 OPEN END (CATHETERS) ×4 IMPLANT
CNTNR SPEC 2.5X3XGRAD LEK (MISCELLANEOUS) ×3
CONRAY 43 FOR UROLOGY 50M (MISCELLANEOUS) ×4 IMPLANT
CONT SPEC 4OZ STER OR WHT (MISCELLANEOUS) ×1
CONT SPEC 4OZ STRL OR WHT (MISCELLANEOUS) ×3
CONTAINER SPEC 2.5X3XGRAD LEK (MISCELLANEOUS) ×3 IMPLANT
DRAPE UTILITY 15X26 TOWEL STRL (DRAPES) ×4 IMPLANT
FEE TECHNICIAN ONLY PER HOUR (MISCELLANEOUS) ×2 IMPLANT
GLOVE BIO SURGEON STRL SZ 6.5 (GLOVE) ×4 IMPLANT
GOWN STRL REUS W/ TWL LRG LVL4 (GOWN DISPOSABLE) ×6 IMPLANT
GOWN STRL REUS W/TWL LRG LVL4 (GOWN DISPOSABLE) ×8
GUIDEWIRE SUPER STIFF (WIRE) ×2 IMPLANT
HOLDER FOLEY CATH W/STRAP (MISCELLANEOUS) IMPLANT
INTRODUCER DILATOR DOUBLE (INTRODUCER) IMPLANT
KIT RM TURNOVER CYSTO AR (KITS) ×4 IMPLANT
LASER FIBER 200M SMARTSCOPE (Laser) IMPLANT
PACK CYSTO AR (MISCELLANEOUS) ×4 IMPLANT
PUMP SINGLE ACTION SAP (PUMP) IMPLANT
SENSORWIRE 0.038 NOT ANGLED (WIRE) ×4
SET CYSTO W/LG BORE CLAMP LF (SET/KITS/TRAYS/PACK) ×4 IMPLANT
SHEATH URETERAL 12FRX35CM (MISCELLANEOUS) IMPLANT
SOL .9 NS 3000ML IRR  AL (IV SOLUTION) ×1
SOL .9 NS 3000ML IRR AL (IV SOLUTION) ×3
SOL .9 NS 3000ML IRR UROMATIC (IV SOLUTION) ×3 IMPLANT
STENT URET 6FRX24 CONTOUR (STENTS) ×2 IMPLANT
STENT URET 6FRX26 CONTOUR (STENTS) IMPLANT
SURGILUBE 2OZ TUBE FLIPTOP (MISCELLANEOUS) ×4 IMPLANT
SYRINGE IRR TOOMEY STRL 70CC (SYRINGE) ×4 IMPLANT
WATER STERILE IRR 1000ML POUR (IV SOLUTION) ×4 IMPLANT
WIRE SENSOR 0.038 NOT ANGLED (WIRE) ×3 IMPLANT

## 2016-01-30 NOTE — Anesthesia Procedure Notes (Signed)
Procedure Name: Intubation Date/Time: 01/30/2016 7:40 AM Performed by: Allean Found Pre-anesthesia Checklist: Patient identified, Emergency Drugs available, Suction available, Patient being monitored and Timeout performed Patient Re-evaluated:Patient Re-evaluated prior to inductionOxygen Delivery Method: Circle system utilized Preoxygenation: Pre-oxygenation with 100% oxygen Intubation Type: IV induction Ventilation: Mask ventilation without difficulty Laryngoscope Size: Mac and 3 Grade View: Grade I Tube type: Oral Tube size: 7.0 mm Number of attempts: 1 Secured at: 22 cm Tube secured with: Tape Dental Injury: Teeth and Oropharynx as per pre-operative assessment

## 2016-01-30 NOTE — Anesthesia Postprocedure Evaluation (Signed)
Anesthesia Post Note  Patient: Kathryn Lozano  Procedure(s) Performed: Procedure(s) (LRB): URETEROSCOPY (Left) CYSTOSCOPY WITH STENT REPLACEMENT (Left)  Patient location during evaluation: PACU Anesthesia Type: General Level of consciousness: awake and alert and oriented Pain management: pain level controlled Vital Signs Assessment: post-procedure vital signs reviewed and stable Respiratory status: spontaneous breathing, nonlabored ventilation and respiratory function stable Cardiovascular status: blood pressure returned to baseline and stable Postop Assessment: no signs of nausea or vomiting Anesthetic complications: no    Last Vitals:  Vitals:   01/30/16 0908 01/30/16 0923  BP: 115/83 124/79  Pulse: 83 64  Resp: 19 15  Temp:  36.5 C    Last Pain:  Vitals:   01/30/16 0923  TempSrc:   PainSc: 3                  Marqual Mi

## 2016-01-30 NOTE — OR Nursing (Signed)
abx gentamicin will be started on call to OR, abx ampicillin will be sent to OR with patient

## 2016-01-30 NOTE — Telephone Encounter (Signed)
-----   Message from Hollice Espy, MD sent at 01/30/2016  8:51 AM EDT ----- Regarding: f/u 4 weeks with RUS prior Order place from PACU.    OK to see me or return to [facility].  Hollice Espy, MD

## 2016-01-30 NOTE — Transfer of Care (Signed)
Immediate Anesthesia Transfer of Care Note  Patient: Kathryn Lozano  Procedure(s) Performed: Procedure(s): URETEROSCOPY (Left) CYSTOSCOPY WITH STENT REPLACEMENT (Left)  Patient Location: PACU  Anesthesia Type:General  Level of Consciousness: awake  Airway & Oxygen Therapy: Patient Spontanous Breathing  Post-op Assessment: Report given to RN and Post -op Vital signs reviewed and stable  Post vital signs: Reviewed and stable  Last Vitals:  Vitals:   01/30/16 0613  BP: 117/89  Pulse: 74  Resp: 16  Temp: 36.7 C    Last Pain:  Vitals:   01/30/16 0613  TempSrc: Oral  PainSc: 3          Complications: No apparent anesthesia complications

## 2016-01-30 NOTE — Interval H&P Note (Signed)
History and Physical Interval Note:  01/30/2016 7:20 AM  Kathryn Lozano  has presented today for surgery, with the diagnosis of left nephrolithiasis  The various methods of treatment have been discussed with the patient and family. After consideration of risks, benefits and other options for treatment, the patient has consented to  Procedure(s): URETEROSCOPY WITH HOLMIUM LASER LITHOTRIPSY (Left) CYSTOSCOPY WITH STENT REPLACEMENT (Left) as a surgical intervention .  The patient's history has been reviewed, patient examined, no change in status, stable for surgery.  I have reviewed the patient's chart and labs.  Questions were answered to the patient's satisfaction.    Return to OR today after left ureteral stent placement 1 week ago.  Hollice Espy

## 2016-01-30 NOTE — Progress Notes (Signed)
Pt voided   No complaints of pain at present

## 2016-01-30 NOTE — Op Note (Signed)
Date of procedure: 01/30/16  Preoperative diagnosis:  1. Left renal stones   Postoperative diagnosis:  1. Same as above   Procedure: 1. Left ureteroscopy 2. Left retrograde pyelogram 3. Basket extraction of left renal stones times multiple 4. Left ureteral stent exchange  Surgeon: Hollice Espy, MD  Anesthesia: General  Complications: None  Intraoperative findings: A proximally for small left ureteral stones extracted using basket from left collecting system. Procedure uncomplicated.  EBL: Minimal  Specimens: Stone fragment  Drains: 6 x 24 French double-J ureteral stent (string left in place)  Indication: Kathryn Lozano is a 30 y.o. patient with with multiple left nonobstructing calculi. She presented to the operating room last week for attempted ureteroscopy, however, due to a narrow ureter, I was unable to advance the scope to the level of the kidney easily. I did place a ureteral stent and she returns today for a staged procedure after passive ureteral dilation..  After reviewing the management options for treatment, she elected to proceed with the above surgical procedure(s). We have discussed the potential benefits and risks of the procedure, side effects of the proposed treatment, the likelihood of the patient achieving the goals of the procedure, and any potential problems that might occur during the procedure or recuperation. Informed consent has been obtained.  Description of procedure:  The patient was taken to the operating room and general anesthesia was induced.  The patient was placed in the dorsal lithotomy position, prepped and draped in the usual sterile fashion, and preoperative antibiotics were administered. A preoperative time-out was performed.   At this point in time, rigid 21 French cystoscope was advanced per urethra into the bladder. Attention was turned to the left ureteral orifice from which a ureteral stent was seen emanating. The distal coil of the  stent was grasped and brought to level of the urethral meatus. The stent was then cannulated using a sensor wire up to level of the kidney.  The stent was then removed leaving the wire place. A dual-lumen catheter was used to introduce a Super Stiff wire up to level of the kidney. A 7 French flexible ureteroscope was then advanced up to level of the kidney over the Super Stiff wire this time without difficulty. Formal pyeloscopy was performed. This revealed approximately 4 small stones in the mid and lower pole calyces. The stones were relatively small and appeared to be amenable to basket extraction. As such, a 0.9 [place] to plus spinal basket was used to extract each of the stones without difficulty. The scope was reintroduced each time using the technique described previously. Finally, the scope was backed to level of the proximal ureter and contrast was injected through the scope to create a retrograde pyelogram that served as a roadmap to ensure that each never calyx had been directly visualized. There is no extravasation noted. Once the kidney was deemed clear of all stone, the scope was backed down the length of the ureter inspecting it along the way. There was no ureteral trauma and minimal ureteral double edema. There are no obstructing stone fragments identified.  Finally, a 6 x 24 French double-J ureteral stent was advanced over the sensor safety wire up to level of the kidney. The wire was partially drawn until full coil was noted within the renal pelvis. The wire was then fully withdrawn until coil was noted within the bladder. The string was left on the stent secured to the patient's left inner thigh using Mastisol and Tegaderm. Her bladder was emptied  using the sheath of the cystoscope. She was then reversed from anesthesia after being repositioned the supine position and taken to the PACU in stable condition.  Plan: Patient will remove her own stent in 3 days. She'll follow-up in 4 weeks with a  renal shunt prior.      Hollice Espy, M.D.

## 2016-01-30 NOTE — Telephone Encounter (Signed)
appt has been made ° °Kathryn Lozano °

## 2016-01-30 NOTE — OR Nursing (Signed)
To OR with dispos panties/pad - advises "I'm still spotting from my period."

## 2016-01-30 NOTE — Anesthesia Preprocedure Evaluation (Signed)
Anesthesia Evaluation  Patient identified by MRN, date of birth, ID band Patient awake    Reviewed: Allergy & Precautions, H&P , NPO status , Patient's Chart, lab work & pertinent test results  History of Anesthesia Complications (+) PONV and history of anesthetic complications  Airway Mallampati: II  TM Distance: >3 FB Neck ROM: full    Dental  (+) Poor Dentition   Pulmonary neg pulmonary ROS, neg shortness of breath, neg COPD,    Pulmonary exam normal breath sounds clear to auscultation- rhonchi (-) wheezing      Cardiovascular Exercise Tolerance: Good (-) angina(-) Past MI and (-) DOE negative cardio ROS Normal cardiovascular exam Rhythm:regular Rate:Normal - Systolic murmurs and - Diastolic murmurs    Neuro/Psych  Headaches, PSYCHIATRIC DISORDERS Anxiety    GI/Hepatic Neg liver ROS, GERD  Controlled and Medicated,  Endo/Other  negative endocrine ROSneg diabetes  Renal/GU Renal disease (nephrolithiasis)  negative genitourinary   Musculoskeletal negative musculoskeletal ROS (+)   Abdominal (+) - obese,   Peds  Hematology negative hematology ROS (+)   Anesthesia Other Findings Past Medical History:   Migraines                                                    ADHD (attention deficit hyperactivity disorder)              Anxiety                                                      Renal disorder                                                 Comment:KIDNEY STONES   PONV (postoperative nausea and vomiting)                       Comment:Pt reports nausea, "sensitive stomach"   Heartburn                                                   Past Surgical History:   LITHOTRIPSY                                      2005         WISDOM TOOTH EXTRACTION                                       CHOLECYSTECTOMY                                 N/A 09/14/2014      Comment:Procedure: LAPAROSCOPIC CHOLECYSTECTOMY;     Surgeon: Coralie Keens, MD;  Location:  [facility]               OR;  Service: General;  Laterality: N/A;  BMI    Body Mass Index   30.83 kg/m 2      Reproductive/Obstetrics negative OB ROS                             Anesthesia Physical  Anesthesia Plan  ASA: II  Anesthesia Plan: General ETT   Post-op Pain Management:    Induction: Intravenous  Airway Management Planned: Oral ETT  Additional Equipment:   Intra-op Plan:   Post-operative Plan: Extubation in OR  Informed Consent: I have reviewed the patients History and Physical, chart, labs and discussed the procedure including the risks, benefits and alternatives for the proposed anesthesia with the patient or authorized representative who has indicated his/her understanding and acceptance.   Dental Advisory Given  Plan Discussed with: Anesthesiologist, CRNA and Surgeon  Anesthesia Plan Comments:         Anesthesia Quick Evaluation

## 2016-01-30 NOTE — Progress Notes (Signed)
Peri pad and panty on

## 2016-01-30 NOTE — Discharge Instructions (Addendum)
AMBULATORY SURGERY  DISCHARGE INSTRUCTIONS   1) The drugs that you were given will stay in your system until tomorrow so for the next 24 hours you should not:  A) Drive an automobile B) Make any legal decisions C) Drink any alcoholic beverage   2) You may resume regular meals tomorrow.  Today it is better to start with liquids and gradually work up to solid foods.  You may eat anything you prefer, but it is better to start with liquids, then soup and crackers, and gradually work up to solid foods.   3) Please notify your doctor immediately if you have any unusual bleeding, trouble breathing, redness and pain at the surgery site, drainage, fever, or pain not relieved by medication.    4) Additional Instructions:        Please contact your physician with any problems or Same Day Surgery at [phone], Monday through Friday 6 am to 4 pm, or [place] [facility] number at [phone].You have a ureteral stent in place.  This is a tube that extends from your kidney to your bladder.  This may cause urinary bleeding, burning with urination, and urinary frequency.  Please call our office or present to the ED if you develop fevers >101 or pain which is not able to be controlled with oral pain medications.  You may be given either Flomax and/ or ditropan to help with bladder spasms and stent pain in addition to pain medications.    Your stent is on a string.  You may untape this and remove fully on Saturday AM.    Please be careful to avoid pulling out the stent prematurely when toileting.    [place] [address], [place], [place] [address] [phone]

## 2016-01-30 NOTE — H&P (View-Only) (Signed)
1:39 PM   Kathryn Lozano 1985-09-22 [id number]  Referring provider: Susy Frizzle, MD [address] [place], [place] [address]  Chief Complaint  Patient presents with  . Nephrolithiasis    4 week follow up    HPI: Patient is a 30 year old Caucasian female who presents today for follow up for a 4 mm distal right ureteral calculus.   Background history Patient was a referral for a right distal ureteral calculus with mild hydronephrosis.  She states that she was having a dull aching in her pelvis that she attributed to her IUD.  The pain then became intense and then radiated to her right flank area.  She had difficulty urinating and was having gross hematuria.  She was seen at an urgent care and diagnosed with an UTI.  The next day the pain become even worse and she couldn't void.  She then sought treatment in the ED.   Patient was given IV analgesics and IV antibiotics.  A CT Renal stone study noted a right 4 mm distal ureteral calculus with mild hydronephrosis.  She was also found to have small non obstructing bilateral renal calculi.  She was discharged with Percocet and instructed to follow up with urology. She has a prior history of stones.  She had her first stone when she was 79 for which she had ESWL and passed 2 to 3 stones already this year.  Her stone composition is unknown.  She has not had a metabolic work up.    We saw the patient on 12/13/2015 and she felt that she may have passed the stone two weeks later.  A KUB did not identify a stone and her follow up RUS did not demonstrate any hydronephrosis and bilateral ureteral jets.  Today, she is not having any urinary symptoms.  She is not having flank pain.  Her UA is clear.  She's not had recent fevers, chills, nausea or vomiting.    She does have stones in her left kidney and she and her husband are attempting to conceive and she would like to decrease her stone burden before becoming pregnant.    PMH: Past  Medical History  Diagnosis Date  . Migraines   . ADHD (attention deficit hyperactivity disorder)   . Anxiety   . Renal disorder     KIDNEY STONES  . PONV (postoperative nausea and vomiting)     Pt reports nausea, "sensitive stomach"  . Heartburn     Surgical History: Past Surgical History  Procedure Laterality Date  . Lithotripsy  2005  . Wisdom tooth extraction    . Cholecystectomy N/A 09/14/2014    Procedure: LAPAROSCOPIC CHOLECYSTECTOMY;  Surgeon: Coralie Keens, MD;  Location: [facility];  Service: General;  Laterality: N/A;    Home Medications:    Medication List       This list is accurate as of: 01/22/16  1:39 PM.  Always use your most recent med list.               acetaminophen 500 MG tablet  Commonly known as:  TYLENOL  Take 1,000 mg by mouth every 8 (eight) hours as needed (migraine headache).     ALEVE PO  Take 2 tablets by mouth daily as needed (headache).     ALPRAZolam 0.5 MG tablet  Commonly known as:  XANAX  Take 1 tablet (0.5 mg total) by mouth every 8 (eight) hours as needed for sleep.  methylphenidate 36 MG CR tablet  Commonly known as:  CONCERTA  Take 1 tablet (36 mg total) by mouth daily after breakfast.     montelukast 10 MG tablet  Commonly known as:  SINGULAIR  Take 1 tablet by mouth at  bedtime     omeprazole 40 MG capsule  Commonly known as:  PRILOSEC  Take 1 capsule (40 mg total) by mouth daily.     ondansetron 4 MG tablet  Commonly known as:  ZOFRAN  Take 1 tablet (4 mg total) by mouth every 8 (eight) hours as needed for nausea or vomiting.     oxyCODONE-acetaminophen 5-325 MG tablet  Commonly known as:  ROXICET  Take 1-2 tablets by mouth every 4 (four) hours as needed for severe pain.     prenatal multivitamin Tabs tablet  Take 1 tablet by mouth daily at 12 noon.     ranitidine 150 MG tablet  Commonly known as:  ZANTAC  Take 1 tablet by mouth at night     topiramate 100 MG tablet  Commonly known as:  TOPAMAX  Take 1  tablet (100 mg total) by mouth 2 (two) times daily.        Allergies: No Known Allergies  Family History: Family History  Problem Relation Age of Onset  . Kidney cancer Neg Hx   . Bladder Cancer Neg Hx   . Alzheimer's disease Paternal Uncle   . Thyroid cancer Paternal Uncle     Social History:  reports that she has never smoked. She has never used smokeless tobacco. She reports that she drinks alcohol. She reports that she does not use illicit drugs.  ROS: UROLOGY Frequent Urination?: No Hard to postpone urination?: No Burning/pain with urination?: No Get up at night to urinate?: No Leakage of urine?: No Urine stream starts and stops?: No Trouble starting stream?: No Do you have to strain to urinate?: No Blood in urine?: No Urinary tract infection?: No Sexually transmitted disease?: No Injury to kidneys or bladder?: No Painful intercourse?: Yes Weak stream?: No Currently pregnant?: No Vaginal bleeding?: No Last menstrual period?: n  Gastrointestinal Nausea?: No Vomiting?: No Indigestion/heartburn?: No Diarrhea?: No Constipation?: No  Constitutional Fever: No Night sweats?: No Weight loss?: No Fatigue?: Yes  Skin Skin rash/lesions?: No Itching?: No  Eyes Blurred vision?: No Double vision?: No  Ears/Nose/Throat Sore throat?: No Sinus problems?: Yes  Hematologic/Lymphatic Swollen glands?: No Easy bruising?: No  Cardiovascular Leg swelling?: No Chest pain?: No  Respiratory Cough?: No Shortness of breath?: No  Endocrine Excessive thirst?: No  Musculoskeletal Back pain?: No Joint pain?: No  Neurological Headaches?: No Dizziness?: No  Psychologic Depression?: No Anxiety?: No  Physical Exam: BP 125/84 mmHg  Pulse 77  Ht 5\' 3"  (1.6 m)  Wt 174 lb 12.8 oz (79.289 kg)  BMI 30.97 kg/m2  LMP 12/26/2015  Constitutional: Well nourished. Alert and oriented, No acute distress. HEENT: [place] AT, moist mucus membranes. Trachea midline, no  masses. Cardiovascular: No clubbing, cyanosis, or edema. Respiratory: Normal respiratory effort, no increased work of breathing. GI: Abdomen is soft, non tender, non distended, no abdominal masses. Liver and spleen not palpable.  No hernias appreciated.  Stool sample for occult testing is not indicated.   GU: No CVA tenderness.  No bladder fullness or masses.   Skin: No rashes, bruises or suspicious lesions. Lymph: No cervical or inguinal adenopathy. Neurologic: Grossly intact, no focal deficits, moving all 4 extremities. Psychiatric: Normal mood and affect.  Laboratory Data: Lab Results  Component Value Date   WBC 9.4 12/09/2015   HGB 13.3 12/09/2015   HCT 40.7 12/09/2015   MCV 84.0 12/09/2015   PLT 216 12/09/2015    Lab Results  Component Value Date   CREATININE 0.79 12/09/2015     Lab Results  Component Value Date   TSH 2.346 02/03/2014       Component Value Date/Time   CHOL 204* 02/03/2014 1132   HDL 57 02/03/2014 1132   CHOLHDL 3.6 02/03/2014 1132   VLDL 12 02/03/2014 1132   LDLCALC 135* 02/03/2014 1132    Lab Results  Component Value Date   AST 15 03/06/2014   Lab Results  Component Value Date   ALT 18 03/06/2014    Urinalysis Results for orders placed or performed in visit on 01/22/16  Microscopic Examination  Result Value Ref Range   WBC, UA None seen 0 -  5 /hpf   RBC, UA 0-2 0 -  2 /hpf   Epithelial Cells (non renal) 0-10 0 - 10 /hpf   Mucus, UA Present (A) Not Estab.   Bacteria, UA None seen None seen/Few  Urinalysis, Complete  Result Value Ref Range   Specific Gravity, UA 1.020 1.005 - 1.030   pH, UA 6.5 5.0 - 7.5   Color, UA Yellow Yellow   Appearance Ur Clear Clear   Leukocytes, UA Negative Negative   Protein, UA Negative Negative/Trace   Glucose, UA Negative Negative   Ketones, UA Negative Negative   RBC, UA Negative Negative   Bilirubin, UA Negative Negative   Urobilinogen, Ur 0.2 0.2 - 1.0 mg/dL   Nitrite, UA Negative Negative    Microscopic Examination See below:     Pertinent Imaging: CLINICAL DATA: 30 year old female with right flank pain. History of kidney stones.  EXAM: CT ABDOMEN AND PELVIS WITHOUT CONTRAST  TECHNIQUE: Multidetector CT imaging of the abdomen and pelvis was performed following the standard protocol without IV contrast.  COMPARISON: CT dated 03/06/2014  FINDINGS: Evaluation of this exam is limited in the absence of intravenous contrast.  The visualized lung bases are clear. No intra-abdominal free air or free fluid identified.  Cholecystectomy. The liver, pancreas, spleen, and adrenal glands appear unremarkable. There is a 4 mm calculus in the distal right ureter adjacent to the right UVJ with mild right hydronephrosis. A 2 mm nonobstructing right renal interpolar calculus noted. Multiple nonobstructing left renal calculi measuring up to 5 mm in the inferior pole of the left kidney. There is no hydronephrosis on the left. The visualized left ureter appears unremarkable. The urinary bladder is grossly unremarkable.  The uterus is anteverted. An intrauterine device is noted. A 2 cm hypodense structure in the left ovary may represent normal ovarian tissue versus a dominant follicle/corpus luteum.  Evaluation of the bowel is limited in the absence of oral contrast. There is no evidence of bowel obstruction or active inflammation. Normal appendix.  The abdominal aorta and IVC appear grossly unremarkable on this noncontrast study. No portal venous gas identified. There is no adenopathy. The abdominal wall soft tissues are grossly unremarkable. A small fat containing umbilical hernia noted. There bilateral L5 pars defects with grade 1 L5-S1 anterolisthesis. No acute fracture.  IMPRESSION: A 4 mm distal right ureteral calculus with mild right hydronephrosis.  Small nonobstructing bilateral renal calculi. There is no hydronephrosis on the left.   Electronically  Signed  By: Anner Crete M.D.  On: 12/09/2015 19:38       CLINICAL DATA: Kidney stones  EXAM:  RENAL / URINARY TRACT ULTRASOUND COMPLETE  COMPARISON: CT scan 12/09/2015  FINDINGS: Right Kidney:  Length: 11.7 cm. Echogenicity within normal limits. No mass or hydronephrosis visualized.  Left Kidney:  Length: 10.7 cm. Echogenicity within normal limits. No mass or hydronephrosis visualized. Shadowing calculus in mid to lower pole of the left kidney measures 5 mm.  Bladder:  Appears normal for degree of bladder distention. Bilateral ureteral jets are visualized.  IMPRESSION: No hydronephrosis. Calculus in mid to lower pole of the left kidney measures 5 mm nonobstructive. Unremarkable urinary bladder. Bilateral ureteral jets.   Electronically Signed  By: Lahoma Crocker M.D.  On: 01/18/2016 14:23   Assessment & Plan:    Patient will undergo left ureteroscopy with laser lithotripsy with ureteral stent placement for definitive treatment for her left renal stones.    1. Left renal stone:   Patient would like to pursue definitive treatment of her left renal stones as she is trying to conceive and would like to decrease her stone burden prior to becoming pregnant.  I stated her best option would be URS/LL/ureteral stent placement.  I explained to the patient how the procedure is performed and the risks involved.  I informed patient that she will have a stent placed during the procedure and will remain in place after the procedure for a short time.  It will be removed in the office with a cystoscope, unless a string in left in place.  I informed that patient that about 50% of patients who undergo ureteroscopy and have a stent will have "stent pain," and this is by far the most common risk/complaint following ureteroscopy. A stent is a soft plastic tube (about half the size of IV tubing) that allows the kidney to drain to the bladder regardless of edema or  obstruction. Not only can the stent "rub" on the inside of the bladder, causing a feeling of needing to urinate/overactive bladder, but also the stent allows urine to pass up from the bladder to the kidney during urination - causing symptoms from a warm, tingling sensation to intense pain in the affected flank.  They may be residual stones within the kidney or ureter may be present up to 40% of the time following ureteroscopy, depending on the original stone size and location. These stone fragments will be seen and addressed on follow-up imaging.  Injury to the ureter is the most common intra-operative complication during ureteroscopy. The reported risk of perforation ranges greatly, depending on whether it is defined as a complete perforation (0.1-0.7% - think of this as a hole through the entire ureter), a partial perforation (1.6% - a hole nearly through the entire ureter), or mucosal tear/scrape (5% - these are similar to a sore on the inside of the mouth). Almost 100% of these will heal with prolonged stenting (anywhere between 2 - 4 weeks). Should a large perforation occur, your urologist may chose to stop the procedure and return on another day when the ureter has had time to heal.  2. Right ureteral stone:   Patient has most likely passed the right ureteral stone as she is not having any further flank pain, hematuria, it is not visible on KUB and the RUS was normal.     - Urinalysis, Complete - CULTURE, URINE COMPREHENSIVE - BMP - hCG, serum qualitative - CBC with Differential/Platelet  3. Bilateral nephrolithiasis:   Patient is interested in a metabolic work up.  We will obtain the study once she has recovered from her left URS/LL/ureteral stent  placement and removal.      4. Right hydronephrosis:   The right hydronephrosis has resolved.    5. Microscopic hematuria:   We will continue to monitor the patient's UA after the treatment/passage of the stone to ensure the hematuria has resolved.  If  hematuria persists, we will pursue a hematuria workup with CT Urogram and cystoscopy if appropriate.      Return for schedule left URS/LL/ureteral stent placement.  These notes generated with voice recognition software. I apologize for typographical errors.  Zara Council, [facility] [address], [place], [place] [address] [phone]

## 2016-01-31 ENCOUNTER — Encounter: Payer: Self-pay | Admitting: Urology

## 2016-02-05 ENCOUNTER — Telehealth: Payer: Self-pay | Admitting: Urology

## 2016-02-05 NOTE — Telephone Encounter (Signed)
Spoke to patient and informed her to call the hospital to make sure she was given the correct abx. I told her to call us on Thursday if she is still having the symptoms. [place] beach [place].

## 2016-02-05 NOTE — Telephone Encounter (Signed)
Bladder spasms can be normal, residual from bladder irritation from the stent.  Drink plenty of water.  [place] to see if they did a culture and her abx are appropriate.    Hollice Espy, MD

## 2016-02-05 NOTE — Telephone Encounter (Signed)
Patient took out her stent and she started having bladder spasms [place], she has a consist urge to urinate and thinks she may have a UTI. They gave her keflex for that and she has 2 more days left on that. Please advise on what you think she should do? Are the spasms normal? She si still having them and says that they are really painful.   Kathryn Lozano

## 2016-02-10 LAB — STONE ANALYSIS
CA OXALATE, MONOHYDR.: 65 %
CA PHOS CRY STONE QL IR: 20 %
Ca Oxalate,Dihydrate: 15 %
Stone Weight KSTONE: 22 mg

## 2016-02-15 ENCOUNTER — Ambulatory Visit
Admission: RE | Admit: 2016-02-15 | Discharge: 2016-02-15 | Disposition: A | Discharge status: Home / Self Care | Payer: 59 | Source: Ambulatory Visit | Attending: Urology | Admitting: Urology

## 2016-02-15 DIAGNOSIS — Z87442 Personal history of urinary calculi: Secondary | ICD-10-CM | POA: Diagnosis present

## 2016-02-15 DIAGNOSIS — N200 Calculus of kidney: Secondary | ICD-10-CM

## 2016-02-27 ENCOUNTER — Ambulatory Visit: Payer: 59 | Admitting: Urology

## 2016-03-05 ENCOUNTER — Other Ambulatory Visit: Payer: 59

## 2016-03-05 ENCOUNTER — Telehealth: Payer: Self-pay | Admitting: *Deleted

## 2016-03-05 DIAGNOSIS — R300 Dysuria: Secondary | ICD-10-CM

## 2016-03-05 LAB — URINALYSIS, COMPLETE
BILIRUBIN UA: NEGATIVE
GLUCOSE, UA: NEGATIVE
KETONES UA: NEGATIVE
Leukocytes, UA: NEGATIVE
NITRITE UA: NEGATIVE
SPEC GRAV UA: 1.015 (ref 1.005–1.030)
UUROB: 0.2 mg/dL (ref 0.2–1.0)
pH, UA: 8.5 — ABNORMAL HIGH (ref 5.0–7.5)

## 2016-03-05 LAB — MICROSCOPIC EXAMINATION: Bacteria, UA: NONE SEEN

## 2016-03-05 NOTE — Progress Notes (Signed)
Patient called complaining of dysuria,frequency,leakage and chills. Patient has been on antibiotics iand has had surgery in the last 30 days. States has felt burning since stent removed. Patient was given the option to have ua and cx,, azo does not work for patient. (Spoke with Judson Roch).

## 2016-03-05 NOTE — Telephone Encounter (Signed)
Patient called to want to know what to do about her pain.Patient asked to provide number for pain scale and she states just uncomfortable.  Patient states she had a stent removed after surgery a while ago and she still has a burning sensation all the time. Also she has burning during urination and its worse at the end of flow. Patient does have appointment next week. I let her know that I would talk with someone and call her back. Called patient back no answer LMOM to call me back. Patient called back and I advised her she could take azo over the counter and patient states it does not work. Patient is to come in on nurse schedule to provide a ua and culture. Patient ok with plan.

## 2016-03-06 ENCOUNTER — Telehealth: Payer: Self-pay | Admitting: *Deleted

## 2016-03-06 DIAGNOSIS — R3989 Other symptoms and signs involving the genitourinary system: Secondary | ICD-10-CM

## 2016-03-06 MED ORDER — PHENAZOPYRIDINE HCL 200 MG PO TABS: 200.0000 mg | ORAL_TABLET | Freq: Three times a day (TID) | ORAL | 0 refills | Status: DC | PRN

## 2016-03-06 NOTE — Telephone Encounter (Signed)
LMOM to call office back.

## 2016-03-06 NOTE — Telephone Encounter (Signed)
Spoke with patient and gave results. Patient states she does not have any pyridium and I told her we could send a rx to her pharmacy. Patient understands and ok with plan.

## 2016-03-06 NOTE — Telephone Encounter (Signed)
-----   Message from Hollice Espy, MD sent at 03/05/2016  5:13 PM EDT ----- UA does not appear particularly worrisome for infection. We'll follow up with her urine culture. There is microscopic blood which may be indicative of ongoing bladder irritation from the stent itself. Please encourage copious hydration and use of Pyridium as needed.  Hollice Espy, MD

## 2016-03-08 LAB — CULTURE, URINE COMPREHENSIVE

## 2016-03-10 ENCOUNTER — Other Ambulatory Visit: Payer: Self-pay | Admitting: Family Medicine

## 2016-03-12 ENCOUNTER — Ambulatory Visit (INDEPENDENT_AMBULATORY_CARE_PROVIDER_SITE_OTHER): Payer: 59 | Admitting: Urology

## 2016-03-12 ENCOUNTER — Encounter: Payer: Self-pay | Admitting: Urology

## 2016-03-12 VITALS — BP 115/80 | HR 73 | Ht 63.0 in | Wt 183.9 lb

## 2016-03-12 DIAGNOSIS — N200 Calculus of kidney: Secondary | ICD-10-CM

## 2016-03-12 DIAGNOSIS — R300 Dysuria: Secondary | ICD-10-CM | POA: Diagnosis not present

## 2016-03-12 DIAGNOSIS — R3129 Other microscopic hematuria: Secondary | ICD-10-CM

## 2016-03-12 MED ORDER — OXYBUTYNIN CHLORIDE 5 MG PO TABS: 5.0000 mg | ORAL_TABLET | Freq: Three times a day (TID) | ORAL | 0 refills | Status: DC | PRN

## 2016-03-12 MED ORDER — PHENAZOPYRIDINE HCL 200 MG PO TABS: 200.0000 mg | ORAL_TABLET | Freq: Three times a day (TID) | ORAL | 0 refills | Status: DC | PRN

## 2016-03-13 LAB — URINALYSIS, COMPLETE
BILIRUBIN UA: NEGATIVE
Glucose, UA: NEGATIVE
Ketones, UA: NEGATIVE
LEUKOCYTES UA: NEGATIVE
Nitrite, UA: NEGATIVE
PH UA: 5.5 (ref 5.0–7.5)
PROTEIN UA: NEGATIVE
SPEC GRAV UA: 1.01 (ref 1.005–1.030)
Urobilinogen, Ur: 0.2 mg/dL (ref 0.2–1.0)

## 2016-03-13 LAB — HCG, SERUM, QUALITATIVE: hCG,Beta Subunit,Qual,Serum: NEGATIVE m[IU]/mL (ref <6)

## 2016-03-13 LAB — MICROSCOPIC EXAMINATION: Bacteria, UA: NONE SEEN

## 2016-03-14 ENCOUNTER — Telehealth: Payer: Self-pay

## 2016-03-14 NOTE — Telephone Encounter (Signed)
Spoke with pt in reference to lab results. Pt voiced understanding.  

## 2016-03-14 NOTE — Telephone Encounter (Signed)
-----   Message from Nori Riis, PA-C sent at 03/13/2016  9:56 PM EDT ----- Pregnancy test is negative.  She can go have her x-ray.

## 2016-03-17 ENCOUNTER — Telehealth: Payer: Self-pay | Admitting: Urology

## 2016-03-17 NOTE — Telephone Encounter (Signed)
Would you contact the patient and ask her if she has had her KUB?

## 2016-03-17 NOTE — Progress Notes (Signed)
8:26 PM   Kathryn Lozano 04-27-1986 [id number]  Referring provider: Susy Frizzle, MD [address] [place], [place] [address]  Chief Complaint  Patient presents with  . Results    RUS    HPI: Patient is a 30 year old Caucasian female who presents today for follow up after undergoing left URS/LL/ureteral stent exchange and subsequent removal of the ureteral stents by the patient 01/30/2016 with Dr. Erlene Quan for multiple left renal stones.    Patient spontaneously passed a right 4 mm distal ureteral calculus found on a CT Renal stone study performed on 12/09/2015.  A KUB performed on 12/18/2015 did not identify a stone and her follow up RUS on 01/18/2016 did not demonstrate any hydronephrosis and bilateral ureteral jets.  She underwent an attempted left ureteroscopy on 01/24/2016 and then returned to the OR on 01/30/2016 for left URS/LL/ureteral stent exchange and subsequent removal of the ureteral stents by the patient 01/30/2016 with Dr. Erlene Quan for multiple left renal stones.    She is now experiencing a burning sensation in her suprapubic area constantly with it intensifying during urination and being at its worse at the end of flow.  She states the AZO and the oxybutynin are controlling her symptoms.    She is also experiencing frequency, urgency, urge incontinence and nocturia. UA today is positive for 3-10 rbc's per high-power field.  She is not experiencing fevers, chills, nausea or vomiting.  A renal ultrasound completed on 02/15/2016 noted normal evaluation of the kidneys and bladder.  PMH: Past Medical History:  Diagnosis Date  . ADHD (attention deficit hyperactivity disorder)   . Anxiety   . Heartburn   . Migraines   . PONV (postoperative nausea and vomiting)    Pt reports nausea, "sensitive stomach"  . Renal disorder    KIDNEY STONES    Surgical History: Past Surgical History:  Procedure Laterality Date  . CHOLECYSTECTOMY N/A 09/14/2014   Procedure: LAPAROSCOPIC CHOLECYSTECTOMY;  Surgeon: Coralie Keens, MD;  Location: [facility];  Service: General;  Laterality: N/A;  . CYSTOSCOPY W/ URETERAL STENT PLACEMENT Left 01/30/2016   Procedure: CYSTOSCOPY WITH STENT REPLACEMENT;  Surgeon: Hollice Espy, MD;  Location: [facility];  Service: Urology;  Laterality: Left;  . CYSTOSCOPY WITH STENT PLACEMENT  01/24/2016   Procedure: CYSTOSCOPY WITH STENT PLACEMENT;  Surgeon: Hollice Espy, MD;  Location: [facility];  Service: Urology;;  . LITHOTRIPSY  2005  . URETEROSCOPY  01/24/2016   Procedure: URETEROSCOPY;  Surgeon: Hollice Espy, MD;  Location: [facility];  Service: Urology;;  . URETEROSCOPY Left 01/30/2016   Procedure: URETEROSCOPY;  Surgeon: Hollice Espy, MD;  Location: [facility];  Service: Urology;  Laterality: Left;  . WISDOM TOOTH EXTRACTION      Home Medications:    Medication List       Accurate as of 03/12/16 11:59 PM. Always use your most recent med list.          acetaminophen 500 MG tablet Commonly known as:  TYLENOL Take 1,000 mg by mouth every 8 (eight) hours as needed (migraine headache). Reported on 01/24/2016   ALEVE PO Take 2 tablets by mouth daily as needed (headache).   ALPRAZolam 0.5 MG tablet Commonly known as:  XANAX Take 1 tablet (0.5 mg total) by mouth every 8 (eight) hours as needed for sleep.   montelukast 10 MG tablet Commonly known as:  SINGULAIR TAKE 1 TABLET BY MOUTH EVERY DAY AS DIRECTED   ondansetron 4 MG tablet Commonly  known as:  ZOFRAN Take 1 tablet (4 mg total) by mouth every 8 (eight) hours as needed for nausea or vomiting.   oxybutynin 5 MG tablet Commonly known as:  DITROPAN Take 1 tablet (5 mg total) by mouth every 8 (eight) hours as needed for bladder spasms.   oxyCODONE 5 MG immediate release tablet Commonly known as:  Oxy IR/ROXICODONE Take 1 tablet (5 mg total) by mouth once as needed (for pain score of 1-4).   phenazopyridine 200 MG tablet Commonly known as:  PYRIDIUM Take 1  tablet (200 mg total) by mouth 3 (three) times daily as needed for pain.   prenatal multivitamin Tabs tablet Take 1 tablet by mouth daily at 12 noon.   ranitidine 150 MG tablet Commonly known as:  ZANTAC Take 1 tablet by mouth at night   tamsulosin 0.4 MG Caps capsule Commonly known as:  FLOMAX Take 1 capsule (0.4 mg total) by mouth daily.       Allergies:  Allergies  Allergen Reactions  . Roxicet [Oxycodone-Acetaminophen] Itching    Pt states that she is itching all over her body.     Family History: Family History  Problem Relation Age of Onset  . Alzheimer's disease Paternal Uncle   . Thyroid cancer Paternal Uncle   . Kidney cancer Neg Hx   . Bladder Cancer Neg Hx     Social History:  reports that she has never smoked. She has never used smokeless tobacco. She reports that she drinks alcohol. She reports that she does not use drugs.  ROS: UROLOGY Frequent Urination?: Yes Hard to postpone urination?: Yes Burning/pain with urination?: Yes Get up at night to urinate?: Yes Leakage of urine?: Yes Urine stream starts and stops?: No Trouble starting stream?: No Do you have to strain to urinate?: No Blood in urine?: No Urinary tract infection?: No Sexually transmitted disease?: No Injury to kidneys or bladder?: No Painful intercourse?: No Weak stream?: No Currently pregnant?: No Vaginal bleeding?: No Last menstrual period?: n  Gastrointestinal Nausea?: No Vomiting?: No Indigestion/heartburn?: No Diarrhea?: No Constipation?: No  Constitutional Fever: No Night sweats?: No Weight loss?: No Fatigue?: Yes  Skin Skin rash/lesions?: No Itching?: No  Eyes Blurred vision?: No Double vision?: No  Ears/Nose/Throat Sore throat?: No Sinus problems?: Yes  Hematologic/Lymphatic Swollen glands?: No Easy bruising?: No  Cardiovascular Leg swelling?: No Chest pain?: No  Respiratory Cough?: No Shortness of breath?: No  Endocrine Excessive thirst?:  Yes  Musculoskeletal Back pain?: No Joint pain?: No  Neurological Headaches?: Yes Dizziness?: No  Psychologic Depression?: No Anxiety?: No  Physical Exam: BP 115/80   Pulse 73   Ht 5\' 3"  (1.6 m)   Wt 183 lb 14.4 oz (83.4 kg)   LMP 03/04/2016   BMI 32.58 kg/m   Constitutional: Well nourished. Alert and oriented, No acute distress. HEENT: [place] AT, moist mucus membranes. Trachea midline, no masses. Cardiovascular: No clubbing, cyanosis, or edema. Respiratory: Normal respiratory effort, no increased work of breathing. GI: Abdomen is soft, non tender, non distended, no abdominal masses. Liver and spleen not palpable.  No hernias appreciated.  Stool sample for occult testing is not indicated.   GU: No CVA tenderness.  No bladder fullness or masses.   Skin: No rashes, bruises or suspicious lesions. Lymph: No cervical or inguinal adenopathy. Neurologic: Grossly intact, no focal deficits, moving all 4 extremities. Psychiatric: Normal mood and affect.  Laboratory Data: Lab Results  Component Value Date   WBC 6.6 01/22/2016   HGB 13.3  12/09/2015   HCT 43.5 01/22/2016   MCV 84 01/22/2016   PLT 237 01/22/2016    Lab Results  Component Value Date   CREATININE 0.77 01/22/2016     Lab Results  Component Value Date   TSH 2.346 02/03/2014       Component Value Date/Time   CHOL 204 (H) 02/03/2014 1132   HDL 57 02/03/2014 1132   CHOLHDL 3.6 02/03/2014 1132   VLDL 12 02/03/2014 1132   LDLCALC 135 (H) 02/03/2014 1132    Lab Results  Component Value Date   AST 15 03/06/2014   Lab Results  Component Value Date   ALT 18 03/06/2014    Urinalysis Results for orders placed or performed in visit on 03/12/16  Microscopic Examination  Result Value Ref Range   WBC, UA 0-5 0 - 5 /hpf   RBC, UA 3-10 (A) 0 - 2 /hpf   Epithelial Cells (non renal) 0-10 0 - 10 /hpf   Bacteria, UA None seen None seen/Few  Urinalysis, Complete  Result Value Ref Range   Specific Gravity, UA  1.010 1.005 - 1.030   pH, UA 5.5 5.0 - 7.5   Color, UA Yellow Yellow   Appearance Ur Clear Clear   Leukocytes, UA Negative Negative   Protein, UA Negative Negative/Trace   Glucose, UA Negative Negative   Ketones, UA Negative Negative   RBC, UA Trace (A) Negative   Bilirubin, UA Negative Negative   Urobilinogen, Ur 0.2 0.2 - 1.0 mg/dL   Nitrite, UA Negative Negative   Microscopic Examination See below:   hCG, serum, qualitative  Result Value Ref Range   hCG,Beta Subunit,Qual,Serum Negative Negative <6 mIU/mL    Pertinent Imaging: CLINICAL DATA:  Kidney stones. History of stones to remove left kidney stone 3 weeks ago.  EXAM: RENAL / URINARY TRACT ULTRASOUND COMPLETE  COMPARISON:  01/18/2016  FINDINGS: Right Kidney:  Length: 11.6 cm. Echogenicity within normal limits. No mass or hydronephrosis visualized.  Left Kidney:  Length: 10.5 cm. Echogenicity within normal limits. No mass or hydronephrosis visualized.  Bladder:  Appears normal for degree of bladder distention.  IMPRESSION: Normal evaluation of the kidneys and bladder.   Electronically Signed   By: Nolon Nations M.D.   On: 02/15/2016 11:46    Assessment & Plan:    1. Dysuria  - UA demonstrated 3-10 RBC's/hpf on today's exam  - symptoms may be the result of residual stone fragment  - offered CT renal stone study, but patient declined due to financial concerns  - compromised on KUB  - will need to check pregnancy status prior to obtaining the KUB  2. Microscopic hematuria  - UA demonstrated 3-10 RBC's/hpf on today's exam  - urine sent for culture  - may still be the result of residual stones  3. Nephrolithiasis  - consider 24 hour urine study in the future  - s/p left URS/LL/ureteral stent exchange and removal  Return for pending pregnancy test.  These notes generated with voice recognition software. I apologize for typographical errors.  Zara Council, [facility] [address], [place], [place] [address] [phone]

## 2016-03-18 ENCOUNTER — Ambulatory Visit
Admission: RE | Admit: 2016-03-18 | Discharge: 2016-03-18 | Disposition: A | Discharge status: Home / Self Care | Payer: 59 | Source: Ambulatory Visit | Attending: Urology | Admitting: Urology

## 2016-03-18 DIAGNOSIS — N200 Calculus of kidney: Secondary | ICD-10-CM | POA: Insufficient documentation

## 2016-03-18 DIAGNOSIS — R109 Unspecified abdominal pain: Secondary | ICD-10-CM | POA: Diagnosis present

## 2016-03-18 DIAGNOSIS — R300 Dysuria: Secondary | ICD-10-CM

## 2016-03-18 DIAGNOSIS — Z87442 Personal history of urinary calculi: Secondary | ICD-10-CM | POA: Diagnosis not present

## 2016-03-18 NOTE — Telephone Encounter (Signed)
Patient called back and states she was going to get her KUB on Friday. That was her next day off. Pateint told that would be fine.

## 2016-03-18 NOTE — Telephone Encounter (Signed)
LMOM for patient to call office back. Larene Beach wants patient to know that her pregnancy test was negative and that she can go get her KUB. (Epic shows KUB not done yet).

## 2016-03-21 ENCOUNTER — Telehealth: Payer: Self-pay

## 2016-03-21 NOTE — Telephone Encounter (Signed)
Would she like to give it more time or schedule a cystoscopy?

## 2016-03-21 NOTE — Telephone Encounter (Signed)
-----   Message from Nori Riis, PA-C sent at 03/20/2016  9:17 PM EDT ----- There were no stones seen in her ureter.  Is she still having burning and hematuria?

## 2016-03-21 NOTE — Telephone Encounter (Signed)
I called and informed the pt about her KUB results. She stated that she actually never seen blood in her urine. The blood was only noted on her urinalysis. The burning have improved, but its still present. She  said if you need to call her back to feel free to leave a message on vm.

## 2016-03-24 ENCOUNTER — Telehealth: Payer: Self-pay

## 2016-03-24 NOTE — Telephone Encounter (Addendum)
I called the pt and informed her on vm if she would like to schedule her cysto we can or we can give it more time. Pt instructed to call back at her earliest convenience.

## 2016-03-24 NOTE — Telephone Encounter (Signed)
Pt called and message left on v/m

## 2016-03-26 NOTE — Telephone Encounter (Signed)
Attempted to contact the pt again, no answer left 3rd message on vm to return my call.

## 2016-07-07 NOTE — L&D Delivery Note (Signed)
Cesarean Section Procedure Note  Indications: failure to progress: arrest of descent and Status post trial of forceps  Pre-operative Diagnosis: 40 week 0 day pregnancy.  Post-operative Diagnosis: same and viable female infant  Surgeon: Alanda Slim Defrancesco   Assistants: Lorelle Gibbs, certified nurse midwife  Anesthesia: Epidural anesthesia  ASA Class: 2   Procedure Details   Preop counseling was completed.  The risks, benefits, complications, treatment options, and expected outcomes were discussed with the patient.  The patient concurred with the proposed plan, giving informed consent.  The epidural was dosed to an appropriate level of anesthesia.  Foley catheter was draining clear yellow urine. After induction of anesthesia, the patient was draped and prepped in the usual sterile manner. Timeout was completed. A Pfannenstiel incision was made and carried down through the subcutaneous tissue to the fascia. Fascial incision was made and extended transversely. The fascia was separated from the underlying rectus tissue superiorly and inferiorly. The peritoneum was identified and entered. Peritoneal incision was extended longitudinally. The utero-vesical peritoneal reflection was incised transversely and the bladder flap was bluntly freed from the lower uterine segment. A low transverse uterine incision was made. Delivered from cephalic presentation was an 8#5oz Female with Apgar scores of 8 at one minute and 9 at five minutes.  Delayed cord clamping was performed.  After the umbilical cord was clamped and cut cord blood was obtained for evaluation. The placenta was removed intact and appeared normal. The uterine outline, tubes and ovaries appeared normal. The uterine incision was closed with running locked sutures of #1 Chromic. A second imbricating layer was also placed. Hemostasis was observed.The fascia was then reapproximated with running sutures of 0 Maxon. Subcutaneous tissue was  reapproximated with 2 0 Vicryl.The skin was reapproximated with 4 0 Monocryl. Dermabond was placed on the incision. Steri strips were applied. Telfa dressing was used to cover the wound.  Instrument, sponge, and needle counts were correct prior the abdominal closure and at the conclusion of the case.   Findings: Viable female infant  Estimated Blood Loss:  750 mL         Drains: None         Total IV Fluids: 900 ml          Complications:  None; patient tolerated the procedure well.         Disposition: PACU - hemodynamically stable.         Condition: stable  Attending Attestation:Martin A Defrancesco, MD

## 2016-07-24 ENCOUNTER — Ambulatory Visit: Payer: 59 | Admitting: Family Medicine

## 2016-08-01 ENCOUNTER — Ambulatory Visit (INDEPENDENT_AMBULATORY_CARE_PROVIDER_SITE_OTHER): Payer: Managed Care, Other (non HMO) | Admitting: Family Medicine

## 2016-08-01 ENCOUNTER — Encounter: Payer: Self-pay | Admitting: Family Medicine

## 2016-08-01 VITALS — BP 100/70 | HR 76 | Temp 98.4°F | Resp 16 | Ht 63.0 in | Wt 204.0 lb

## 2016-08-01 DIAGNOSIS — D485 Neoplasm of uncertain behavior of skin: Secondary | ICD-10-CM

## 2016-08-01 DIAGNOSIS — B351 Tinea unguium: Secondary | ICD-10-CM | POA: Diagnosis not present

## 2016-08-01 DIAGNOSIS — R5383 Other fatigue: Secondary | ICD-10-CM

## 2016-08-01 LAB — TSH: TSH: 3.52 mIU/L

## 2016-08-01 NOTE — Progress Notes (Signed)
Subjective:    Patient ID: Kathryn Lozano, female    DOB: [birth year], 31 y.o.   MRN: [id number]  HPI Patient is concerned because she is exercising 5 days a week for 30 minutes a day and still gaining weight. She is eating 1500 cal a day and still gaining weight. She also reports severe fatigue and constipation and dry skin and thin hair. Her family has a history of hypothyroidism and she would like to be screened for this. She also has a suspicious lesion on her lower left abdomen. Is approximately 2 cm in diameter. It is a brown papule with a warty texture consistent with a Seborrheic keratosis.  However he now has a thin brown halo spreading outward from the base which is new and a change.  She also has a thick yellow dystrophic appearing toenail on her left great toe consistent with onychomycosis Past Medical History:  Diagnosis Date  . ADHD (attention deficit hyperactivity disorder)   . Anxiety   . Heartburn   . Migraines   . PONV (postoperative nausea and vomiting)    Pt reports nausea, "sensitive stomach"  . Renal disorder    KIDNEY STONES   Past Surgical History:  Procedure Laterality Date  . CHOLECYSTECTOMY N/A 09/14/2014   Procedure: LAPAROSCOPIC CHOLECYSTECTOMY;  Surgeon: Coralie Keens, MD;  Location: [facility];  Service: General;  Laterality: N/A;  . CYSTOSCOPY W/ URETERAL STENT PLACEMENT Left 01/30/2016   Procedure: CYSTOSCOPY WITH STENT REPLACEMENT;  Surgeon: Hollice Espy, MD;  Location: [facility];  Service: Urology;  Laterality: Left;  . CYSTOSCOPY WITH STENT PLACEMENT  01/24/2016   Procedure: CYSTOSCOPY WITH STENT PLACEMENT;  Surgeon: Hollice Espy, MD;  Location: [facility];  Service: Urology;;  . LITHOTRIPSY  2005  . URETEROSCOPY  01/24/2016   Procedure: URETEROSCOPY;  Surgeon: Hollice Espy, MD;  Location: [facility];  Service: Urology;;  . URETEROSCOPY Left 01/30/2016   Procedure: URETEROSCOPY;  Surgeon: Hollice Espy, MD;  Location: [facility];  Service: Urology;   Laterality: Left;  . WISDOM TOOTH EXTRACTION     Current Outpatient Prescriptions on File Prior to Visit  Medication Sig Dispense Refill  . montelukast (SINGULAIR) 10 MG tablet TAKE 1 TABLET BY MOUTH EVERY DAY AS DIRECTED 30 tablet 11  . Prenatal Vit-Fe Fumarate-FA (PRENATAL MULTIVITAMIN) TABS tablet Take 1 tablet by mouth daily at 12 noon.    . ranitidine (ZANTAC) 150 MG tablet Take 1 tablet by mouth at night (Patient taking differently: Take 150 mg by mouth every morning. Take 1 tablet by mouth at night) 90 tablet 3   No current facility-administered medications on file prior to visit.    Allergies  Allergen Reactions  . Roxicet [Oxycodone-Acetaminophen] Itching    Pt states that she is itching all over her body.    Social History   Social History  . Marital status: Married    Spouse name: N/A  . Number of children: N/A  . Years of education: N/A   Occupational History  . Not on file.   Social History Main Topics  . Smoking status: Never Smoker  . Smokeless tobacco: Never Used  . Alcohol use Yes     Comment: Once every 6 months.   . Drug use: No  . Sexual activity: Not on file   Other Topics Concern  . Not on file   Social History Narrative  . No narrative on file      Review of Systems  All other systems reviewed and are negative.  Objective:   Physical Exam  Constitutional: She appears well-developed and well-nourished.  Cardiovascular: Normal rate, regular rhythm and normal heart sounds.   Vitals reviewed.    See hpi     Assessment & Plan:  Other fatigue - Plan: TSH  Neoplasm of uncertain behavior of skin - Plan: Ambulatory referral to Dermatology  Onychomycosis  Given her fatigue I will check a TSH. There is no evidence of a quarter today on exam. I recommended a shave biopsy of the brown papule on her lower left abdomen to rule out melanoma. She would like a second opinion with a dermatologist first. She has onychomycosis in her left great  toenail. We discussed using Lamisil and she decided against that based on potential wrist to her liver and because she is trying to get pregnant right now.

## 2016-08-04 ENCOUNTER — Encounter: Payer: Self-pay | Admitting: Family Medicine

## 2016-08-07 ENCOUNTER — Encounter: Payer: Self-pay | Admitting: Family Medicine

## 2016-09-24 ENCOUNTER — Telehealth: Payer: Self-pay | Admitting: Family Medicine

## 2016-09-24 NOTE — Telephone Encounter (Signed)
Pt called stating that the fungal infection that was in her toenails is now in her finger nails too and wants to know if something can be called in. She had discussed trying to get pregnant but has since changed her mind because this is more urgent than getting pregnant. If she needs an appointment one has been scheduled for Friday but please advise.

## 2016-09-25 ENCOUNTER — Encounter: Payer: Self-pay | Admitting: Obstetrics and Gynecology

## 2016-09-25 ENCOUNTER — Ambulatory Visit (INDEPENDENT_AMBULATORY_CARE_PROVIDER_SITE_OTHER): Payer: Managed Care, Other (non HMO) | Admitting: Obstetrics and Gynecology

## 2016-09-25 VITALS — BP 100/67 | HR 75 | Ht 63.0 in | Wt 208.5 lb

## 2016-09-25 DIAGNOSIS — Z3169 Encounter for other general counseling and advice on procreation: Secondary | ICD-10-CM

## 2016-09-25 NOTE — Progress Notes (Signed)
HPI:      [name] is a 31 y.o. G1P0010 who LMP was Patient's last menstrual period was 09/16/2016 (exact date). This is it was Subjective:   She presents today To discuss the possibility of infertility. She states that she has been having regular monthly periods with the exception of December. She says that she had a positive pregnancy test at home and when she went to have a confirmation performed she began to have heavy bleeding and subsequently miscarried. Her partner has never fathered a baby. She has never had an STD or significant abdominal surgery. She did use Depo-Provera for 10 years followed by approximately a year and a half of Mirena use but has had regular monthly menses over the 9 months. She is currently using a phone app to track her fertility and trying to time intercourse.    Hx: The following portions of the patient's history were reviewed and updated as appropriate:              She  has a past medical history of ADHD (attention deficit hyperactivity disorder); Anxiety; Heartburn; Migraines; PONV (postoperative nausea and vomiting); and Renal disorder. She  does not have any pertinent problems on file. She  has a past surgical history that includes Lithotripsy (2005); Wisdom tooth extraction; Cholecystectomy (N/A, 09/14/2014); Ureteroscopy (01/24/2016); Cystoscopy with stent placement (01/24/2016); Ureteroscopy (Left, 01/30/2016); and Cystoscopy w/ ureteral stent placement (Left, 01/30/2016). Her family history includes Alzheimer's disease in her paternal grandmother and paternal uncle; Thyroid cancer in her paternal uncle. She  reports that she has never smoked. She has never used smokeless tobacco. She reports that she drinks alcohol. She reports that she does not use drugs. Current Outpatient Prescriptions on File Prior to Visit  Medication Sig Dispense Refill  . montelukast (SINGULAIR) 10 MG tablet TAKE 1 TABLET BY MOUTH EVERY DAY AS DIRECTED 30 tablet 11  .  Prenatal Vit-Fe Fumarate-FA (PRENATAL MULTIVITAMIN) TABS tablet Take 1 tablet by mouth daily at 12 noon.    . ranitidine (ZANTAC) 150 MG tablet Take 1 tablet by mouth at night (Patient taking differently: Take 150 mg by mouth every morning. Take 1 tablet by mouth at night) 90 tablet 3   No current facility-administered medications on file prior to visit.          Review of Systems:  Review of Systems  Constitutional: Denied constitutional symptoms, night sweats, recent illness, fatigue, fever, insomnia and weight loss.  Eyes: Denied eye symptoms, eye pain, photophobia, vision change and visual disturbance.  Ears/Nose/Throat/Neck: Denied ear, nose, throat or neck symptoms, hearing loss, nasal discharge, sinus congestion and sore throat.  Cardiovascular: Denied cardiovascular symptoms, arrhythmia, chest pain/pressure, edema, exercise intolerance, orthopnea and palpitations.  Respiratory: Denied pulmonary symptoms, asthma, pleuritic pain, productive sputum, cough, dyspnea and wheezing.  Gastrointestinal: Denied, gastro-esophageal reflux, melena, nausea and vomiting.  Genitourinary: Denied genitourinary symptoms including symptomatic vaginal discharge, pelvic relaxation issues, and urinary complaints.  Musculoskeletal: Denied musculoskeletal symptoms, stiffness, swelling, muscle weakness and myalgia.  Dermatologic: Denied dermatology symptoms, rash and scar.  Neurologic: Denied neurology symptoms, dizziness, headache, neck pain and syncope.  Psychiatric: Denied psychiatric symptoms, anxiety and depression.  Endocrine: Denied endocrine symptoms including hot flashes and night sweats.   Meds:   Current Outpatient Prescriptions on File Prior to Visit  Medication Sig Dispense Refill  . montelukast (SINGULAIR) 10 MG tablet TAKE 1 TABLET BY MOUTH EVERY DAY AS DIRECTED 30 tablet 11  . Prenatal Vit-Fe Fumarate-FA (PRENATAL MULTIVITAMIN) TABS tablet  Take 1 tablet by mouth daily at 12 noon.    .  ranitidine (ZANTAC) 150 MG tablet Take 1 tablet by mouth at night (Patient taking differently: Take 150 mg by mouth every morning. Take 1 tablet by mouth at night) 90 tablet 3   No current facility-administered medications on file prior to visit.     Objective:     Vitals:   09/25/16 0819  BP: 100/67  Pulse: 75                Assessment:    G1P0010 Patient Active Problem List   Diagnosis Date Noted  . Chronic cough 12/28/2012  . Post-nasal drip 12/28/2012  . Anxiety   . Migraines   . ADHD (attention deficit hyperactivity disorder)      1. Infertility counseling     Patient has not yet been attempting intercourse for a year. Also possible that she had a miscarriage in December although not completely confirmed.  She is likely ovulating based on her monthly menses. No risk factors for physical abnormality tubal blockage etc.    Plan:            1.  We have discussed multiple methods of timing intercourse and ovulation. We have discussed ovulation predictor kits, menstrual calender timing. We have also discussed future workup for possible infertility including hormonal testing and semen analysis. She will attempt intercourse over the next 3 months using ovulation predictor kits and menstrual timing. If she has a home positive pregnancy test I recommended that she have a serum performed here. If she does not get pregnant by June will begin complete infertility workup in earnest.     F/U  Return in about 3 months (around 12/26/2016). I spent 31 minutes with this patient of which greater than 50% was spent discussing infertility, workup, obtaining history.  Finis Bud, M.D. 09/25/2016 9:10 AM

## 2016-09-25 NOTE — Telephone Encounter (Signed)
Need to see fingernails, onychomycosis is rare in fingernails.

## 2016-09-25 NOTE — Telephone Encounter (Signed)
Pt aware and informed to keep appt

## 2016-09-26 ENCOUNTER — Ambulatory Visit (INDEPENDENT_AMBULATORY_CARE_PROVIDER_SITE_OTHER): Payer: Managed Care, Other (non HMO) | Admitting: Family Medicine

## 2016-09-26 ENCOUNTER — Encounter: Payer: Self-pay | Admitting: Family Medicine

## 2016-09-26 VITALS — BP 120/70 | HR 93 | Temp 97.7°F | Resp 16 | Ht 63.0 in | Wt 211.0 lb

## 2016-09-26 DIAGNOSIS — M722 Plantar fascial fibromatosis: Secondary | ICD-10-CM | POA: Diagnosis not present

## 2016-09-26 NOTE — Progress Notes (Signed)
Subjective:    Patient ID: Kathryn Lozano, female    DOB: [birth year], 31 y.o.   MRN: [id number]  HPI Patient originally made the appointment to discuss treatment for onychomycosis. However the rash is not present on her fingernails as she described her note. She has no interest in treating the fungal infection on her toenails due to the fact she is trying to become pregnant. However while she is here she would like to discuss pain she's having in her feet. The pain is located on the plantar suffers of her calcaneus bilaterally. It is worse first thing in the morning. Feels like a stabbing pain on the sole of foot right inner heel. As she walks on it the pain gradually started to go away. The pain returned after sitting for prolonged period time. She is tried no medications to treat this. Past Medical History:  Diagnosis Date  . ADHD (attention deficit hyperactivity disorder)   . Anxiety   . Heartburn   . Migraines   . PONV (postoperative nausea and vomiting)    Pt reports nausea, "sensitive stomach"  . Renal disorder    KIDNEY STONES   Past Surgical History:  Procedure Laterality Date  . CHOLECYSTECTOMY N/A 09/14/2014   Procedure: LAPAROSCOPIC CHOLECYSTECTOMY;  Surgeon: Coralie Keens, MD;  Location: [place];  Service: General;  Laterality: N/A;  . CYSTOSCOPY W/ URETERAL STENT PLACEMENT Left 01/30/2016   Procedure: CYSTOSCOPY WITH STENT REPLACEMENT;  Surgeon: Hollice Espy, MD;  Location: [facility];  Service: Urology;  Laterality: Left;  . CYSTOSCOPY WITH STENT PLACEMENT  01/24/2016   Procedure: CYSTOSCOPY WITH STENT PLACEMENT;  Surgeon: Hollice Espy, MD;  Location: [facility];  Service: Urology;;  . LITHOTRIPSY  2005  . URETEROSCOPY  01/24/2016   Procedure: URETEROSCOPY;  Surgeon: Hollice Espy, MD;  Location: [facility];  Service: Urology;;  . URETEROSCOPY Left 01/30/2016   Procedure: URETEROSCOPY;  Surgeon: Hollice Espy, MD;  Location: [facility];  Service: Urology;  Laterality: Left;    . WISDOM TOOTH EXTRACTION     Current Outpatient Prescriptions on File Prior to Visit  Medication Sig Dispense Refill  . montelukast (SINGULAIR) 10 MG tablet TAKE 1 TABLET BY MOUTH EVERY DAY AS DIRECTED 30 tablet 11  . Prenatal Vit-Fe Fumarate-FA (PRENATAL MULTIVITAMIN) TABS tablet Take 1 tablet by mouth daily at 12 noon.    . ranitidine (ZANTAC) 150 MG tablet Take 1 tablet by mouth at night (Patient taking differently: Take 150 mg by mouth every morning. Take 1 tablet by mouth at night) 90 tablet 3   No current facility-administered medications on file prior to visit.    Allergies  Allergen Reactions  . Roxicet [Oxycodone-Acetaminophen] Itching    Pt states that she is itching all over her body.    Social History   Social History  . Marital status: Married    Spouse name: N/A  . Number of children: N/A  . Years of education: N/A   Occupational History  . Not on file.   Social History Main Topics  . Smoking status: Never Smoker  . Smokeless tobacco: Never Used  . Alcohol use Yes     Comment: Once every 6 months.   . Drug use: No  . Sexual activity: Yes    Birth control/ protection: None   Other Topics Concern  . Not on file   Social History Narrative  . No narrative on file      Review of Systems  All other systems reviewed and are  negative.      Objective:   Physical Exam  Cardiovascular: Normal rate, regular rhythm and normal heart sounds.   Pulmonary/Chest: Effort normal and breath sounds normal.  Musculoskeletal:       Right foot: There is tenderness. There is normal range of motion, no swelling, no crepitus and no deformity.       Left foot: There is tenderness. There is normal range of motion, no bony tenderness, no swelling and no deformity.       Feet:  Vitals reviewed.         Assessment & Plan:  Plantar fasciitis, bilateral  History and exam is consistent with plantar fasciitis. Skin 15 minutes discussing stretching techniques to help  treat this. I provided the patient with a handout and a website outlining stretching techniques to perform. Also recommended that she start wearing night splints at night to try to help treat the inflammation in the plantar fascia. If the symptoms are not improving she can return for cortisone injection

## 2016-10-15 ENCOUNTER — Other Ambulatory Visit: Payer: Self-pay

## 2016-10-15 ENCOUNTER — Other Ambulatory Visit: Payer: Managed Care, Other (non HMO)

## 2016-10-15 ENCOUNTER — Telehealth: Payer: Self-pay | Admitting: Obstetrics and Gynecology

## 2016-10-15 DIAGNOSIS — N926 Irregular menstruation, unspecified: Secondary | ICD-10-CM

## 2016-10-15 DIAGNOSIS — N912 Amenorrhea, unspecified: Secondary | ICD-10-CM

## 2016-10-15 NOTE — Telephone Encounter (Signed)
Pt stopped by office. Labs ok to be drawn per DJE.

## 2016-10-15 NOTE — Telephone Encounter (Signed)
Patient had a positive pregnancy test this morning she was told to call when this happened and per plan MD will order testing  Please call

## 2016-10-16 ENCOUNTER — Telehealth: Payer: Self-pay

## 2016-10-16 LAB — BETA HCG QUANT (REF LAB): hCG Quant: 54 m[IU]/mL

## 2016-10-16 NOTE — Telephone Encounter (Signed)
Spoke with patient- her quant was 45. She will stop by office tomorrow for a repeat quant.

## 2016-10-17 ENCOUNTER — Telehealth: Payer: Self-pay

## 2016-10-17 ENCOUNTER — Other Ambulatory Visit: Payer: Managed Care, Other (non HMO)

## 2016-10-17 ENCOUNTER — Other Ambulatory Visit: Payer: Self-pay | Admitting: Obstetrics and Gynecology

## 2016-10-17 DIAGNOSIS — N912 Amenorrhea, unspecified: Secondary | ICD-10-CM

## 2016-10-17 NOTE — Telephone Encounter (Signed)
10/16/16 provider was out of office. Another provider reviewed results and ordered a repeat Beta for 10/17/16 which was drawn. Mychart message left for pt to call office to schedule appt on 10/20/16.

## 2016-10-17 NOTE — Telephone Encounter (Signed)
-----   Message from Harlin Heys, MD sent at 10/17/2016 10:28 AM EDT ----- Please call her and have her do another Quant on Monday and schedule and appointment with me on Tues.

## 2016-10-18 LAB — BETA HCG QUANT (REF LAB): hCG Quant: 156 m[IU]/mL

## 2016-10-20 ENCOUNTER — Other Ambulatory Visit: Payer: Self-pay | Admitting: Obstetrics and Gynecology

## 2016-10-20 ENCOUNTER — Other Ambulatory Visit: Payer: Self-pay

## 2016-10-20 ENCOUNTER — Other Ambulatory Visit: Payer: Managed Care, Other (non HMO)

## 2016-10-20 NOTE — Addendum Note (Signed)
Addended by: Elouise Munroe on: 10/20/2016 08:42 AM   Modules accepted: Orders

## 2016-10-21 ENCOUNTER — Other Ambulatory Visit: Payer: Managed Care, Other (non HMO)

## 2016-10-21 ENCOUNTER — Encounter: Payer: Self-pay | Admitting: Obstetrics and Gynecology

## 2016-10-21 ENCOUNTER — Encounter: Payer: Self-pay | Admitting: Family Medicine

## 2016-10-21 ENCOUNTER — Ambulatory Visit (INDEPENDENT_AMBULATORY_CARE_PROVIDER_SITE_OTHER): Payer: Managed Care, Other (non HMO) | Admitting: Obstetrics and Gynecology

## 2016-10-21 VITALS — BP 108/74 | HR 82 | Ht 63.0 in | Wt 209.4 lb

## 2016-10-21 DIAGNOSIS — N926 Irregular menstruation, unspecified: Secondary | ICD-10-CM | POA: Diagnosis not present

## 2016-10-21 LAB — BETA HCG QUANT (REF LAB): HCG QUANT: 733 m[IU]/mL

## 2016-10-21 NOTE — Progress Notes (Signed)
HPI:      [name] is a 31 y.o. G2P0010 who LMP was Patient's last menstrual period was 09/16/2016 (exact date).  Subjective:   She presents today After having a positive pregnancy test and several appropriately rising quantitative beta hCG levels. She was having difficulty becoming pregnant and is very excited about this pregnancy. She has no complaints at this time. Her last menstrual period was 5 weeks ago today.    Hx: The following portions of the patient's history were reviewed and updated as appropriate:              She  has a past medical history of ADHD (attention deficit hyperactivity disorder); Anxiety; Heartburn; Migraines; PONV (postoperative nausea and vomiting); and Renal disorder. She  does not have any pertinent problems on file. She  has a past surgical history that includes Lithotripsy (2005); Wisdom tooth extraction; Cholecystectomy (N/A, 09/14/2014); Ureteroscopy (01/24/2016); Cystoscopy with stent placement (01/24/2016); Ureteroscopy (Left, 01/30/2016); and Cystoscopy w/ ureteral stent placement (Left, 01/30/2016). Her family history includes Alzheimer's disease in her paternal grandmother and paternal uncle; Thyroid cancer in her paternal uncle. She  reports that she has never smoked. She has never used smokeless tobacco. She reports that she does not drink alcohol or use drugs. Current Outpatient Prescriptions on File Prior to Visit  Medication Sig Dispense Refill  . montelukast (SINGULAIR) 10 MG tablet TAKE 1 TABLET BY MOUTH EVERY DAY AS DIRECTED 30 tablet 11  . Prenatal Vit-Fe Fumarate-FA (PRENATAL MULTIVITAMIN) TABS tablet Take 1 tablet by mouth daily at 12 noon.    . ranitidine (ZANTAC) 150 MG tablet Take 1 tablet by mouth at night (Patient taking differently: Take 150 mg by mouth every morning. Take 1 tablet by mouth at night) 90 tablet 3   No current facility-administered medications on file prior to visit.          Review of Systems:  Review of  Systems  Constitutional: Denied constitutional symptoms, night sweats, recent illness, fatigue, fever, insomnia and weight loss.  Eyes: Denied eye symptoms, eye pain, photophobia, vision change and visual disturbance.  Ears/Nose/Throat/Neck: Denied ear, nose, throat or neck symptoms, hearing loss, nasal discharge, sinus congestion and sore throat.  Cardiovascular: Denied cardiovascular symptoms, arrhythmia, chest pain/pressure, edema, exercise intolerance, orthopnea and palpitations.  Respiratory: Denied pulmonary symptoms, asthma, pleuritic pain, productive sputum, cough, dyspnea and wheezing.  Gastrointestinal: Denied, gastro-esophageal reflux, melena, nausea and vomiting.  Genitourinary: Denied genitourinary symptoms including symptomatic vaginal discharge, pelvic relaxation issues, and urinary complaints.  Musculoskeletal: Denied musculoskeletal symptoms, stiffness, swelling, muscle weakness and myalgia.  Dermatologic: Denied dermatology symptoms, rash and scar.  Neurologic: Denied neurology symptoms, dizziness, headache, neck pain and syncope.  Psychiatric: Denied psychiatric symptoms, anxiety and depression.  Endocrine: Denied endocrine symptoms including hot flashes and night sweats.   Meds:   Current Outpatient Prescriptions on File Prior to Visit  Medication Sig Dispense Refill  . montelukast (SINGULAIR) 10 MG tablet TAKE 1 TABLET BY MOUTH EVERY DAY AS DIRECTED 30 tablet 11  . Prenatal Vit-Fe Fumarate-FA (PRENATAL MULTIVITAMIN) TABS tablet Take 1 tablet by mouth daily at 12 noon.    . ranitidine (ZANTAC) 150 MG tablet Take 1 tablet by mouth at night (Patient taking differently: Take 150 mg by mouth every morning. Take 1 tablet by mouth at night) 90 tablet 3   No current facility-administered medications on file prior to visit.     Objective:     Vitals:   10/21/16 0821  BP: 108/74  Pulse: 82              Quantitative beta hCG results reviewed directly with the  patient.  Assessment:    G2P0010 Patient Active Problem List   Diagnosis Date Noted  . Chronic cough 12/28/2012  . Post-nasal drip 12/28/2012  . Anxiety   . Migraines   . ADHD (attention deficit hyperactivity disorder)      1. Missed menses     Patient was attempting pregnancy and is excited.   Plan:            Prenatal Plan 1.  The patient was given prenatal literature. 2.  She was continued on prenatal vitamins. 3.  A prenatal lab panel was ordered or drawn. 4.  An ultrasound was ordered to better determine an EDC. 5.  A nurse visit was scheduled.  Orders Orders Placed This Encounter  Procedures  . US OB Comp Less 14 Wks  . Beta HCG, Quant    No orders of the defined types were placed in this encounter.       F/U  Return in about 4 weeks (around 11/18/2016). I spent 16 minutes with this patient of which greater than 50% was spent discussing pregnancy, quantitative beta hCG levels. Future visits, prenatal testing including CFDNA and AFP.  Finis Bud, M.D. 10/21/2016 9:30 AM

## 2016-10-23 ENCOUNTER — Other Ambulatory Visit: Payer: Managed Care, Other (non HMO)

## 2016-10-26 ENCOUNTER — Encounter: Payer: Self-pay | Admitting: Family Medicine

## 2016-10-27 ENCOUNTER — Telehealth: Payer: Self-pay | Admitting: Obstetrics and Gynecology

## 2016-10-27 NOTE — Telephone Encounter (Signed)
Patient left a message stating she has swelling in her legs. She is 51/[redacted] weeks pregnant. Please advise

## 2016-10-28 ENCOUNTER — Encounter: Payer: Self-pay | Admitting: Obstetrics and Gynecology

## 2016-10-28 NOTE — Telephone Encounter (Signed)
Advised pt to drink a lot of water and not to add salt to her diet. Also wear good supportive shoes and elevate legs whenever possible. Denies and SOB.

## 2016-10-29 ENCOUNTER — Telehealth: Payer: Self-pay | Admitting: Obstetrics and Gynecology

## 2016-10-29 NOTE — Telephone Encounter (Signed)
Spoke with pt- she reports she is having what she thought were gas pains. When she went to bathroom she had scant spotting. She states this is what happened with her previous miscarriage. Encouraged her to go home and rest. Stay hydrated and call back if bleeding or abd discomfort becomes worse. States she will keep U/S appt tomorrow.

## 2016-10-29 NOTE — Telephone Encounter (Signed)
Spoke with patient- she has been resting/napping and the spotting is very slight. States she still has some slight abd discomfort which seems to be better with her laying down. Will continue to rest with instructions to go to ED if bleeding or cramping become worse. Also encouraged to call L&D with any questions after office hours.

## 2016-10-29 NOTE — Telephone Encounter (Signed)
Patient called stating she is cramping and having light bleeding. She has had a previous miscarriage and feeling uneasy. She would like a call back.Thanks

## 2016-10-30 ENCOUNTER — Ambulatory Visit (INDEPENDENT_AMBULATORY_CARE_PROVIDER_SITE_OTHER): Payer: Managed Care, Other (non HMO)

## 2016-10-30 ENCOUNTER — Ambulatory Visit (INDEPENDENT_AMBULATORY_CARE_PROVIDER_SITE_OTHER): Payer: Managed Care, Other (non HMO) | Admitting: Obstetrics and Gynecology

## 2016-10-30 VITALS — BP 104/69 | HR 86 | Wt 210.1 lb

## 2016-10-30 DIAGNOSIS — N926 Irregular menstruation, unspecified: Secondary | ICD-10-CM | POA: Diagnosis not present

## 2016-10-30 DIAGNOSIS — O26851 Spotting complicating pregnancy, first trimester: Secondary | ICD-10-CM

## 2016-10-30 DIAGNOSIS — Z3481 Encounter for supervision of other normal pregnancy, first trimester: Secondary | ICD-10-CM

## 2016-10-30 DIAGNOSIS — T7589XA Other specified effects of external causes, initial encounter: Secondary | ICD-10-CM

## 2016-10-30 DIAGNOSIS — Z202 Contact with and (suspected) exposure to infections with a predominantly sexual mode of transmission: Secondary | ICD-10-CM

## 2016-10-30 DIAGNOSIS — R638 Other symptoms and signs concerning food and fluid intake: Secondary | ICD-10-CM

## 2016-10-30 NOTE — Patient Instructions (Signed)
Morning Sickness Morning sickness is when you feel sick to your stomach (nauseous) during pregnancy. You may feel sick to your stomach and throw up (vomit). You may feel sick in the morning, but you can feel this way any time of day. Some women feel very sick to their stomach and cannot stop throwing up (hyperemesis gravidarum). Follow these instructions at home:  Only take medicines as told by your doctor.  Take multivitamins as told by your doctor. Taking multivitamins before getting pregnant can stop or lessen the harshness of morning sickness.  Eat dry toast or unsalted crackers before getting out of bed.  Eat 5 to 6 small meals a day.  Eat dry and bland foods like rice and baked potatoes.  Do not drink liquids with meals. Drink between meals.  Do not eat greasy, fatty, or spicy foods.  Have someone cook for you if the smell of food causes you to feel sick or throw up.  If you feel sick to your stomach after taking prenatal vitamins, take them at night or with a snack.  Eat protein when you need a snack (nuts, yogurt, cheese).  Eat unsweetened gelatins for dessert.  Wear a bracelet used for sea sickness (acupressure wristband).  Go to a doctor that puts thin needles into certain body points (acupuncture) to improve how you feel.  Do not smoke.  Use a humidifier to keep the air in your house free of odors.  Get lots of fresh air. Contact a doctor if:  You need medicine to feel better.  You feel dizzy or lightheaded.  You are losing weight. Get help right away if:  You feel very sick to your stomach and cannot stop throwing up.  You pass out (faint). This information is not intended to replace advice given to you by your health care provider. Make sure you discuss any questions you have with your health care provider. Document Released: 07/31/2004 Document Revised: 11/29/2015 Document Reviewed: 12/08/2012 Elsevier Interactive Patient Education  2017 [employer]. How a Baby Grows During Pregnancy Pregnancy begins when a female's sperm enters a female's egg (fertilization). This happens in one of the tubes (fallopian tubes) that connect the ovaries to the womb (uterus). The fertilized egg is called an embryo until it reaches 10 weeks. From 10 weeks until birth, it is called a fetus. The fertilized egg moves down the fallopian tube to the uterus. Then it implants into the lining of the uterus and begins to grow. The developing fetus receives oxygen and nutrients through the pregnant woman's bloodstream and the tissues that grow (placenta) to support the fetus. The placenta is the life support system for the fetus. It provides nutrition and removes waste. Learning as much as you can about your pregnancy and how your baby is developing can help you enjoy the experience. It can also make you aware of when there might be a problem and when to ask questions. How long does a typical pregnancy last? A pregnancy usually lasts 280 days, or about 40 weeks. Pregnancy is divided into three trimesters:  First trimester: 0-13 weeks.  Second trimester: 14-27 weeks.  Third trimester: 28-40 weeks. The day when your baby is considered ready to be born (full term) is your estimated date of delivery. How does my baby develop month by month? First month  The fertilized egg attaches to the inside of the uterus.  Some cells will form the placenta. Others will form the fetus.  The arms, legs, brain, spinal cord,  lungs, and heart begin to develop.  At the end of the first month, the heart begins to beat. Second month  The bones, inner ear, eyelids, hands, and feet form.  The genitals develop.  By the end of 8 weeks, all major organs are developing. Third month  All of the internal organs are forming.  Teeth develop below the gums.  Bones and muscles begin to grow. The spine can flex.  The skin is transparent.  Fingernails and toenails begin to form.  Arms  and legs continue to grow longer, and hands and feet develop.  The fetus is about 3 in (7.6 cm) long. Fourth month  The placenta is completely formed.  The external sex organs, neck, outer ear, eyebrows, eyelids, and fingernails are formed.  The fetus can hear, swallow, and move its arms and legs.  The kidneys begin to produce urine.  The skin is covered with a white waxy coating (vernix) and very fine hair (lanugo). Fifth month  The fetus moves around more and can be felt for the first time (quickening).  The fetus starts to sleep and wake up and may begin to suck its finger.  The nails grow to the end of the fingers.  The organ in the digestive system that makes bile (gallbladder) functions and helps to digest the nutrients.  If your baby is a girl, eggs are present in her ovaries. If your baby is a boy, testicles start to move down into his scrotum. Sixth month  The lungs are formed, but the fetus is not yet able to breathe.  The eyes open. The brain continues to develop.  Your baby has fingerprints and toe prints. Your baby's hair grows thicker.  At the end of the second trimester, the fetus is about 9 in (22.9 cm) long. Seventh month  The fetus kicks and stretches.  The eyes are developed enough to sense changes in light.  The hands can make a grasping motion.  The fetus responds to sound. Eighth month  All organs and body systems are fully developed and functioning.  Bones harden and taste buds develop. The fetus may hiccup.  Certain areas of the brain are still developing. The skull remains soft. Ninth month  The fetus gains about  lb (0.23 kg) each week.  The lungs are fully developed.  Patterns of sleep develop.  The fetus's head typically moves into a head-down position (vertex) in the uterus to prepare for birth. If the buttocks move into a vertex position instead, the baby is breech.  The fetus weighs 6-9 lbs (2.72-4.08 kg) and is 19-20 in  (48.26-50.8 cm) long. What can I do to have a healthy pregnancy and help my baby develop?  Eating and Drinking  Eat a healthy diet.  Talk with your health care provider to make sure that you are getting the nutrients that you and your baby need.  Visit www.BuildDNA.es to learn about creating a healthy diet.  Gain a healthy amount of weight during pregnancy as advised by your health care provider. This is usually 25-35 pounds. You may need to:  Gain more if you were underweight before getting pregnant or if you are pregnant with more than one baby.  Gain less if you were overweight or obese when you got pregnant. Medicines and Vitamins  Take prenatal vitamins as directed by your health care provider. These include vitamins such as folic acid, iron, calcium, and vitamin D. They are important for healthy development.  Take medicines only  as directed by your health care provider. Read labels and ask a pharmacist or your health care provider whether over-the-counter medicines, supplements, and prescription drugs are safe to take during pregnancy. Activities  Be physically active as advised by your health care provider. Ask your health care provider to recommend activities that are safe for you to do, such as walking or swimming.  Do not participate in strenuous or extreme sports. Lifestyle  Do not drink alcohol.  Do not use any tobacco products, including cigarettes, chewing tobacco, or electronic cigarettes. If you need help quitting, ask your health care provider.  Do not use illegal drugs. Safety  Avoid exposure to mercury, lead, or other heavy metals. Ask your health care provider about common sources of these heavy metals.  Avoid listeria infection during pregnancy. Follow these precautions:  Do not eat soft cheeses or deli meats.  Do not eat hot dogs unless they have been warmed up to the point of steaming, such as in the microwave oven.  Do not drink unpasteurized  milk.  Avoid toxoplasmosis infection during pregnancy. Follow these precautions:  Do not change your cat's litter box, if you have a cat. Ask someone else to do this for you.  Wear gardening gloves while working in the yard. General Instructions  Keep all follow-up visits as directed by your health care provider. This is important. This includes prenatal care and screening tests.  Manage any chronic health conditions. Work closely with your health care provider to keep conditions, such as diabetes, under control. How do I know if my baby is developing well? At each prenatal visit, your health care provider will do several different tests to check on your health and keep track of your baby's development. These include:  Fundal height.  Your health care provider will measure your growing belly from top to bottom using a tape measure.  Your health care provider will also feel your belly to determine your baby's position.  Heartbeat.  An ultrasound in the first trimester can confirm pregnancy and show a heartbeat, depending on how far along you are.  Your health care provider will check your baby's heart rate at every prenatal visit.  As you get closer to your delivery date, you may have regular fetal heart rate monitoring to make sure that your baby is not in distress.  Second trimester ultrasound.  This ultrasound checks your baby's development. It also indicates your baby's gender. What should I do if I have concerns about my baby's development? Always talk with your health care provider about any concerns that you may have. This information is not intended to replace advice given to you by your health care provider. Make sure you discuss any questions you have with your health care provider. Document Released: 12/10/2007 Document Revised: 11/29/2015 Document Reviewed: 11/30/2013 Elsevier Interactive Patient Education  2017 [place] of Pregnancy The first  trimester of pregnancy is from week 1 until the end of week 13 (months 1 through 3). A week after a sperm fertilizes an egg, the egg will implant on the wall of the uterus. This embryo will begin to develop into a baby. Genes from you and your partner will form the baby. The female genes will determine whether the baby will be a boy or a girl. At 6-8 weeks, the eyes and face will be formed, and the heartbeat can be seen on ultrasound. At the end of 12 weeks, all the baby's organs will be formed. Now that you  are pregnant, you will want to do everything you can to have a healthy baby. Two of the most important things are to get good prenatal care and to follow your health care provider's instructions. Prenatal care is all the medical care you receive before the baby's birth. This care will help prevent, find, and treat any problems during the pregnancy and childbirth. Body changes during your first trimester Your body goes through many changes during pregnancy. The changes vary from woman to woman.  You may gain or lose a couple of pounds at first.  You may feel sick to your stomach (nauseous) and you may throw up (vomit). If the vomiting is uncontrollable, call your health care provider.  You may tire easily.  You may develop headaches that can be relieved by medicines. All medicines should be approved by your health care provider.  You may urinate more often. Painful urination may mean you have a bladder infection.  You may develop heartburn as a result of your pregnancy.  You may develop constipation because certain hormones are causing the muscles that push stool through your intestines to slow down.  You may develop hemorrhoids or swollen veins (varicose veins).  Your breasts may begin to grow larger and become tender. Your nipples may stick out more, and the tissue that surrounds them (areola) may become darker.  Your gums may bleed and may be sensitive to brushing and flossing.  Dark  spots or blotches (chloasma, mask of pregnancy) may develop on your face. This will likely fade after the baby is born.  Your menstrual periods will stop.  You may have a loss of appetite.  You may develop cravings for certain kinds of food.  You may have changes in your emotions from day to day, such as being excited to be pregnant or being concerned that something may go wrong with the pregnancy and baby.  You may have more vivid and strange dreams.  You may have changes in your hair. These can include thickening of your hair, rapid growth, and changes in texture. Some women also have hair loss during or after pregnancy, or hair that feels dry or thin. Your hair will most likely return to normal after your baby is born. What to expect at prenatal visits During a routine prenatal visit:  You will be weighed to make sure you and the baby are growing normally.  Your blood pressure will be taken.  Your abdomen will be measured to track your baby's growth.  The fetal heartbeat will be listened to between weeks 10 and 14 of your pregnancy.  Test results from any previous visits will be discussed. Your health care provider may ask you:  How you are feeling.  If you are feeling the baby move.  If you have had any abnormal symptoms, such as leaking fluid, bleeding, severe headaches, or abdominal cramping.  If you are using any tobacco products, including cigarettes, chewing tobacco, and electronic cigarettes.  If you have any questions. Other tests that may be performed during your first trimester include:  Blood tests to find your blood type and to check for the presence of any previous infections. The tests will also be used to check for low iron levels (anemia) and protein on red blood cells (Rh antibodies). Depending on your risk factors, or if you previously had diabetes during pregnancy, you may have tests to check for high blood sugar that affects pregnant women (gestational  diabetes).  Urine tests to check for infections,  diabetes, or protein in the urine.  An ultrasound to confirm the proper growth and development of the baby.  Fetal screens for spinal cord problems (spina bifida) and Down syndrome.  HIV (human immunodeficiency virus) testing. Routine prenatal testing includes screening for HIV, unless you choose not to have this test.  You may need other tests to make sure you and the baby are doing well. Follow these instructions at home: Medicines   Follow your health care provider's instructions regarding medicine use. Specific medicines may be either safe or unsafe to take during pregnancy.  Take a prenatal vitamin that contains at least 600 micrograms (mcg) of folic acid.  If you develop constipation, try taking a stool softener if your health care provider approves. Eating and drinking   Eat a balanced diet that includes fresh fruits and vegetables, whole grains, good sources of protein such as meat, eggs, or tofu, and low-fat dairy. Your health care provider will help you determine the amount of weight gain that is right for you.  Avoid raw meat and uncooked cheese. These carry germs that can cause birth defects in the baby.  Eating four or five small meals rather than three large meals a day may help relieve nausea and vomiting. If you start to feel nauseous, eating a few soda crackers can be helpful. Drinking liquids between meals, instead of during meals, also seems to help ease nausea and vomiting.  Limit foods that are high in fat and processed sugars, such as fried and sweet foods.  To prevent constipation:  Eat foods that are high in fiber, such as fresh fruits and vegetables, whole grains, and beans.  Drink enough fluid to keep your urine clear or pale yellow. Activity   Exercise only as directed by your health care provider. Most women can continue their usual exercise routine during pregnancy. Try to exercise for 30 minutes at  least 5 days a week. Exercising will help you:  Control your weight.  Stay in shape.  Be prepared for labor and delivery.  Experiencing pain or cramping in the lower abdomen or lower back is a good sign that you should stop exercising. Check with your health care provider before continuing with normal exercises.  Try to avoid standing for long periods of time. Move your legs often if you must stand in one place for a long time.  Avoid heavy lifting.  Wear low-heeled shoes and practice good posture.  You may continue to have sex unless your health care provider tells you not to. Relieving pain and discomfort   Wear a good support bra to relieve breast tenderness.  Take warm sitz baths to soothe any pain or discomfort caused by hemorrhoids. Use hemorrhoid cream if your health care provider approves.  Rest with your legs elevated if you have leg cramps or low back pain.  If you develop varicose veins in your legs, wear support hose. Elevate your feet for 15 minutes, 3-4 times a day. Limit salt in your diet. Prenatal care   Schedule your prenatal visits by the twelfth week of pregnancy. They are usually scheduled monthly at first, then more often in the last 2 months before delivery.  Write down your questions. Take them to your prenatal visits.  Keep all your prenatal visits as told by your health care provider. This is important. Safety   Wear your seat belt at all times when driving.  Make a list of emergency phone numbers, including numbers for family, friends, the hospital, and  police and Public relations account executive. General instructions   Ask your health care provider for a referral to a local prenatal education class. Begin classes no later than the beginning of month 6 of your pregnancy.  Ask for help if you have counseling or nutritional needs during pregnancy. Your health care provider can offer advice or refer you to specialists for help with various needs.  Do not use hot  tubs, steam rooms, or saunas.  Do not douche or use tampons or scented sanitary pads.  Do not cross your legs for long periods of time.  Avoid cat litter boxes and soil used by cats. These carry germs that can cause birth defects in the baby and possibly loss of the fetus by miscarriage or stillbirth.  Avoid all smoking, herbs, alcohol, and medicines not prescribed by your health care provider. Chemicals in these products affect the formation and growth of the baby.  Do not use any products that contain nicotine or tobacco, such as cigarettes and e-cigarettes. If you need help quitting, ask your health care provider. You may receive counseling support and other resources to help you quit.  Schedule a dentist appointment. At home, brush your teeth with a soft toothbrush and be gentle when you floss. Contact a health care provider if:  You have dizziness.  You have mild pelvic cramps, pelvic pressure, or nagging pain in the abdominal area.  You have persistent nausea, vomiting, or diarrhea.  You have a bad smelling vaginal discharge.  You have pain when you urinate.  You notice increased swelling in your face, hands, legs, or ankles.  You are exposed to fifth disease or chickenpox.  You are exposed to [place] measles (rubella) and have never had it. Get help right away if:  You have a fever.  You are leaking fluid from your vagina.  You have spotting or bleeding from your vagina.  You have severe abdominal cramping or pain.  You have rapid weight gain or loss.  You vomit blood or material that looks like coffee grounds.  You develop a severe headache.  You have shortness of breath.  You have any kind of trauma, such as from a fall or a car accident. Summary  The first trimester of pregnancy is from week 1 until the end of week 13 (months 1 through 3).  Your body goes through many changes during pregnancy. The changes vary from woman to woman.  You will have routine  prenatal visits. During those visits, your health care provider will examine you, discuss any test results you may have, and talk with you about how you are feeling. This information is not intended to replace advice given to you by your health care provider. Make sure you discuss any questions you have with your health care provider. Document Released: 06/17/2001 Document Revised: 06/04/2016 Document Reviewed: 06/04/2016 Elsevier Interactive Patient Education  2017 [place]. Common Medications Safe in Pregnancy  Acne:      Constipation:  Benzoyl Peroxide     Colace  Clindamycin      Dulcolax Suppository  Topica Erythromycin     Fibercon  Salicylic Acid      Metamucil         Miralax AVOID:        Senakot   Accutane    Cough:  Retin-A       Cough Drops  Tetracycline      Phenergan w/ Codeine if Rx  Minocycline      Robitussin (Plain & DM)  Antibiotics:     Crabs/Lice:  Ceclor       RID  Cephalosporins    AVOID:  E-Mycins      Kwell  Keflex  Macrobid/Macrodantin   Diarrhea:  Penicillin      Kao-Pectate  Zithromax      Imodium AD         PUSH FLUIDS AVOID:       Cipro     Fever:  Tetracycline      Tylenol (Regular or Extra  Minocycline       Strength)  Levaquin      Extra Strength-Do not          Exceed 8 tabs/24 hrs Caffeine:        <227m/day (equiv. To 1 cup of coffee or  approx. 3 12 oz sodas)         Gas: Cold/Hayfever:       Gas-X  Benadryl      Mylicon  Claritin       Phazyme  **Claritin-D        Chlor-Trimeton    Headaches:  Dimetapp      ASA-Free Excedrin  Drixoral-Non-Drowsy     Cold Compress  Mucinex (Guaifenasin)     Tylenol (Regular or Extra  Sudafed/Sudafed-12 Hour     Strength)  **Sudafed PE Pseudoephedrine   Tylenol Cold & Sinus     Vicks Vapor Rub  Zyrtec  **AVOID if Problems With Blood Pressure         Heartburn: Avoid lying down for at least 1 hour after meals  Aciphex      Maalox     Rash:  Milk of  Magnesia     Benadryl    Mylanta       1% Hydrocortisone Cream  Pepcid  Pepcid Complete   Sleep Aids:  Prevacid      Ambien   Prilosec       Benadryl  Rolaids       Chamomile Tea  Tums (Limit 4/day)     Unisom  Zantac       Tylenol PM         Warm milk-add vanilla or  Hemorrhoids:       Sugar for taste  Anusol/Anusol H.C.  (RX: Analapram 2.5%)  Sugar Substitutes:  Hydrocortisone OTC     Ok in moderation  Preparation H      Tucks        Vaseline lotion applied to tissue with wiping    Herpes:     Throat:  Acyclovir      Oragel  Famvir  Valtrex     Vaccines:         Flu Shot Leg Cramps:       *Gardasil  Benadryl      Hepatitis A         Hepatitis B Nasal Spray:       Pneumovax  Saline Nasal Spray     Polio Booster         Tetanus Nausea:       Tuberculosis test or PPD  Vitamin B6 25 mg TID   AVOID:    Dramamine      *Gardasil  Emetrol       Live Poliovirus  Ginger Root 250 mg QID    MMR (measles, mumps &  High Complex Carbs @ Bedtime    rebella)  Sea Bands-Accupressure    Varicella (Chickenpox)  Unisom 1/2 tab TID     *  No known complications           If received before Pain:         Known pregnancy;   Darvocet       Resume series after  Lortab        Delivery  Percocet    Yeast:   Tramadol      Femstat  Tylenol 3      Gyne-lotrimin  Ultram       Monistat  Vicodin           MISC:         All Sunscreens           Hair Coloring/highlights          Insect Repellant's          (Including DEET)         Mystic Tans

## 2016-10-30 NOTE — Progress Notes (Signed)
Noel Gerold presents for NOB nurse interview visit.  Pregnancy confirmation done ____4/17/2018 with DJE__.  G2- .  P-0010 . Dating scan will be done today at 3 pm. Pt aware if scan reveals that she is further along than 6 weeks,  she will need to be seen by provider by 11-12 weeks. Pt states she had very minimal bleeding yesterday. Only a few spots in her underwear and tinge pink on TP. Lasted for about 6 hours. NO IC recently. NO cramps. Gas only which is relieved with Gas -x. Pregnancy education material explained and given. _POS for cats in the home. Toxoplasma ordered. Advised pt to NOT change litter box.  NOB labs ordered. TSH/HbgA1c  ordered due to Increased BMI.   HIV labs and Drug screen were explained ordered. Last pap found in sys- 2014 neg. PNV encouraged. Genetic screening options discussed. Genetic testing: to discuss with provider. Pt [facility] know [facility] Pt states she would like to tour both facilities and then make a decision. Aware we only deliver at [facility] [place]. Pt. To follow up with provider in _5_ weeks for NOB physical.  All questions answered.

## 2016-10-31 LAB — URINALYSIS, ROUTINE W REFLEX MICROSCOPIC
Bilirubin, UA: NEGATIVE
GLUCOSE, UA: NEGATIVE
KETONES UA: NEGATIVE
Leukocytes, UA: NEGATIVE
NITRITE UA: NEGATIVE
PROTEIN UA: NEGATIVE
RBC, UA: NEGATIVE
SPEC GRAV UA: 1.024 (ref 1.005–1.030)
UUROB: 0.2 mg/dL (ref 0.2–1.0)
pH, UA: 6 (ref 5.0–7.5)

## 2016-10-31 LAB — MONITOR DRUG PROFILE 14(MW)
Amphetamine Scrn, Ur: NEGATIVE ng/mL
BARBITURATE SCREEN URINE: NEGATIVE ng/mL
BENZODIAZEPINE SCREEN, URINE: NEGATIVE ng/mL
Buprenorphine, Urine: NEGATIVE ng/mL
CANNABINOIDS UR QL SCN: NEGATIVE ng/mL
CREATININE(CRT), U: 62 mg/dL (ref 20.0–300.0)
Cocaine (Metab) Scrn, Ur: NEGATIVE ng/mL
FENTANYL, URINE: NEGATIVE pg/mL
MEPERIDINE SCREEN, URINE: NEGATIVE ng/mL
METHADONE SCREEN, URINE: NEGATIVE ng/mL
OPIATE SCREEN URINE: NEGATIVE ng/mL
OXYCODONE+OXYMORPHONE UR QL SCN: NEGATIVE ng/mL
Ph of Urine: 5.9 (ref 4.5–8.9)
Phencyclidine Qn, Ur: NEGATIVE ng/mL
Propoxyphene Scrn, Ur: NEGATIVE ng/mL
SPECIFIC GRAVITY: 1.013
TRAMADOL SCREEN, URINE: NEGATIVE ng/mL

## 2016-10-31 LAB — CBC WITH DIFFERENTIAL/PLATELET
BASOS ABS: 0 10*3/uL (ref 0.0–0.2)
Basos: 0 %
EOS (ABSOLUTE): 0.1 10*3/uL (ref 0.0–0.4)
EOS: 1 %
HEMATOCRIT: 41 % (ref 34.0–46.6)
HEMOGLOBIN: 13.8 g/dL (ref 11.1–15.9)
IMMATURE GRANULOCYTES: 0 %
Immature Grans (Abs): 0 10*3/uL (ref 0.0–0.1)
LYMPHS ABS: 2.3 10*3/uL (ref 0.7–3.1)
Lymphs: 25 %
MCH: 27.6 pg (ref 26.6–33.0)
MCHC: 33.7 g/dL (ref 31.5–35.7)
MCV: 82 fL (ref 79–97)
MONOCYTES: 8 %
Monocytes Absolute: 0.8 10*3/uL (ref 0.1–0.9)
NEUTROS PCT: 66 %
Neutrophils Absolute: 6.2 10*3/uL (ref 1.4–7.0)
Platelets: 220 10*3/uL (ref 150–379)
RBC: 5 x10E6/uL (ref 3.77–5.28)
RDW: 14.3 % (ref 12.3–15.4)
WBC: 9.4 10*3/uL (ref 3.4–10.8)

## 2016-10-31 LAB — VARICELLA ZOSTER ANTIBODY, IGG: Varicella zoster IgG: 608 index (ref >165)

## 2016-10-31 LAB — HIV ANTIBODY (ROUTINE TESTING W REFLEX): HIV Screen 4th Generation wRfx: NONREACTIVE

## 2016-10-31 LAB — ABO AND RH: Rh Factor: POSITIVE

## 2016-10-31 LAB — TOXOPLASMA ANTIBODIES- IGG AND  IGM: Toxoplasma IgG Ratio: <3 IU/mL (ref 0.0–7.1)

## 2016-10-31 LAB — HEMOGLOBIN A1C
ESTIMATED AVERAGE GLUCOSE: 100 mg/dL
Hgb A1c MFr Bld: 5.1 % (ref 4.8–5.6)

## 2016-10-31 LAB — ANTIBODY SCREEN: ANTIBODY SCREEN: NEGATIVE

## 2016-10-31 LAB — HEPATITIS B SURFACE ANTIGEN: HEP B S AG: NEGATIVE

## 2016-10-31 LAB — NICOTINE SCREEN, URINE: COTININE UR QL SCN: NEGATIVE ng/mL

## 2016-10-31 LAB — RUBELLA SCREEN

## 2016-10-31 LAB — TSH: TSH: 3.81 u[IU]/mL (ref 0.450–4.500)

## 2016-10-31 LAB — RPR: RPR: NONREACTIVE

## 2016-11-01 LAB — URINE CULTURE, OB REFLEX

## 2016-11-01 LAB — GC/CHLAMYDIA PROBE AMP
CHLAMYDIA, DNA PROBE: NEGATIVE
Neisseria gonorrhoeae by PCR: NEGATIVE

## 2016-11-01 LAB — CULTURE, OB URINE

## 2016-11-05 ENCOUNTER — Other Ambulatory Visit: Payer: Self-pay | Admitting: Obstetrics and Gynecology

## 2016-11-05 DIAGNOSIS — O9989 Other specified diseases and conditions complicating pregnancy, childbirth and the puerperium: Principal | ICD-10-CM

## 2016-11-05 DIAGNOSIS — Z2839 Other underimmunization status: Secondary | ICD-10-CM

## 2016-11-05 DIAGNOSIS — O09899 Supervision of other high risk pregnancies, unspecified trimester: Secondary | ICD-10-CM

## 2016-11-05 DIAGNOSIS — Z283 Underimmunization status: Secondary | ICD-10-CM | POA: Insufficient documentation

## 2016-11-19 ENCOUNTER — Encounter: Payer: Self-pay | Admitting: Obstetrics and Gynecology

## 2016-11-20 ENCOUNTER — Encounter: Payer: Self-pay | Admitting: Obstetrics and Gynecology

## 2016-12-03 ENCOUNTER — Encounter: Payer: Self-pay | Admitting: Emergency Medicine

## 2016-12-03 ENCOUNTER — Emergency Department
Admission: EM | Admit: 2016-12-03 | Discharge: 2016-12-03 | Disposition: A | Discharge status: Home / Self Care | Payer: Managed Care, Other (non HMO) | Attending: Emergency Medicine | Admitting: Emergency Medicine

## 2016-12-03 ENCOUNTER — Encounter: Payer: Self-pay | Admitting: Obstetrics and Gynecology

## 2016-12-03 DIAGNOSIS — R42 Dizziness and giddiness: Secondary | ICD-10-CM | POA: Diagnosis not present

## 2016-12-03 DIAGNOSIS — Z3A11 11 weeks gestation of pregnancy: Secondary | ICD-10-CM | POA: Diagnosis not present

## 2016-12-03 DIAGNOSIS — F909 Attention-deficit hyperactivity disorder, unspecified type: Secondary | ICD-10-CM | POA: Diagnosis not present

## 2016-12-03 DIAGNOSIS — O26891 Other specified pregnancy related conditions, first trimester: Secondary | ICD-10-CM | POA: Insufficient documentation

## 2016-12-03 DIAGNOSIS — Z3491 Encounter for supervision of normal pregnancy, unspecified, first trimester: Secondary | ICD-10-CM

## 2016-12-03 LAB — URINALYSIS, COMPLETE (UACMP) WITH MICROSCOPIC
Bilirubin Urine: NEGATIVE
GLUCOSE, UA: NEGATIVE mg/dL
HGB URINE DIPSTICK: NEGATIVE
Ketones, ur: NEGATIVE mg/dL
Leukocytes, UA: NEGATIVE
Nitrite: NEGATIVE
PH: 7 (ref 5.0–8.0)
Protein, ur: NEGATIVE mg/dL
SPECIFIC GRAVITY, URINE: 1.005 (ref 1.005–1.030)

## 2016-12-03 LAB — CBC
HCT: 40.5 % (ref 35.0–47.0)
Hemoglobin: 13.6 g/dL (ref 12.0–16.0)
MCH: 27.9 pg (ref 26.0–34.0)
MCHC: 33.7 g/dL (ref 32.0–36.0)
MCV: 82.8 fL (ref 80.0–100.0)
PLATELETS: 220 10*3/uL (ref 150–440)
RBC: 4.9 MIL/uL (ref 3.80–5.20)
RDW: 13.9 % (ref 11.5–14.5)
WBC: 10.6 10*3/uL (ref 3.6–11.0)

## 2016-12-03 LAB — BASIC METABOLIC PANEL
Anion gap: 7 (ref 5–15)
BUN: 9 mg/dL (ref 6–20)
CALCIUM: 9.3 mg/dL (ref 8.9–10.3)
CO2: 26 mmol/L (ref 22–32)
CREATININE: 0.63 mg/dL (ref 0.44–1.00)
Chloride: 103 mmol/L (ref 101–111)
GFR calc non Af Amer: >60 mL/min (ref >60)
GLUCOSE: 94 mg/dL (ref 65–99)
Potassium: 3.8 mmol/L (ref 3.5–5.1)
Sodium: 136 mmol/L (ref 135–145)

## 2016-12-03 NOTE — ED Provider Notes (Signed)
[facility] Emergency Department Provider Note  ____________________________________________  Time seen: Approximately 7:11 PM  I have reviewed the triage vital signs and the nursing notes.   HISTORY  Chief Complaint Dizziness    HPI OSHA RANE is a 31 y.o. female who complains of lightheaded dizzy feeling for the past 2 days, intermittent lasting a few minutes at a time. Also associated with some intermittent headache, bilateral frontal. No syncope. Mild to moderate intensity. No aggravating or alleviating factors. No head injuries or falls. No neck pain and stiffness or fevers. No vomiting. No vision changes numbness tingling or weakness. Also reports finding her blood pressure recently slightly elevated at 150/110 earlier. No history of hypertension. She is [redacted] weeks pregnant, followed up [place] [facility] and has an appointment tomorrow.Patient reports eating okay but not drinking enough water  No abdominal pain. No dysuria frequency urgency vaginal bleeding or discharge.     Past Medical History:  Diagnosis Date  . ADHD (attention deficit hyperactivity disorder)   . Anxiety   . Heartburn   . Migraines   . PONV (postoperative nausea and vomiting)    Pt reports nausea, "sensitive stomach"  . Renal disorder    KIDNEY STONES     Patient Active Problem List   Diagnosis Date Noted  . Rubella non-immune status, antepartum 11/05/2016  . Chronic cough 12/28/2012  . Post-nasal drip 12/28/2012  . Anxiety   . Migraines   . ADHD (attention deficit hyperactivity disorder)      Past Surgical History:  Procedure Laterality Date  . CHOLECYSTECTOMY N/A 09/14/2014   Procedure: LAPAROSCOPIC CHOLECYSTECTOMY;  Surgeon: Coralie Keens, MD;  Location: [place];  Service: General;  Laterality: N/A;  . CYSTOSCOPY W/ URETERAL STENT PLACEMENT Left 01/30/2016   Procedure: CYSTOSCOPY WITH STENT REPLACEMENT;  Surgeon: Hollice Espy, MD;  Location: [facility]  ORS;  Service: Urology;  Laterality: Left;  . CYSTOSCOPY WITH STENT PLACEMENT  01/24/2016   Procedure: CYSTOSCOPY WITH STENT PLACEMENT;  Surgeon: Hollice Espy, MD;  Location: [facility] ORS;  Service: Urology;;  . LITHOTRIPSY  2005  . URETEROSCOPY  01/24/2016   Procedure: URETEROSCOPY;  Surgeon: Hollice Espy, MD;  Location: [facility] ORS;  Service: Urology;;  . URETEROSCOPY Left 01/30/2016   Procedure: URETEROSCOPY;  Surgeon: Hollice Espy, MD;  Location: [facility] ORS;  Service: Urology;  Laterality: Left;  . WISDOM TOOTH EXTRACTION       Prior to Admission medications   Medication Sig Start Date End Date Taking? Authorizing Provider  montelukast (SINGULAIR) 10 MG tablet TAKE 1 TABLET BY MOUTH EVERY DAY AS DIRECTED 03/11/16   Susy Frizzle, MD  Prenatal Vit-Fe Fumarate-FA (PRENATAL MULTIVITAMIN) TABS tablet Take 1 tablet by mouth daily at 12 noon.    [provider]  ranitidine (ZANTAC) 150 MG tablet Take 1 tablet by mouth at night Patient taking differently: Take 150 mg by mouth every morning. Take 1 tablet by mouth at night 12/17/15   Susy Frizzle, MD     Allergies Roxicet [oxycodone-acetaminophen]   Family History  Problem Relation Age of Onset  . Alzheimer's disease Paternal Uncle   . Thyroid cancer Paternal Uncle   . Alzheimer's disease Paternal Grandmother   . Kidney cancer Neg Hx   . Bladder Cancer Neg Hx   . Breast cancer Neg Hx   . Rashes / Skin problems Neg Hx   . Colon cancer Neg Hx   . Diabetes Neg Hx     Social History Social History  Substance Use Topics  . Smoking status: Never Smoker  . Smokeless tobacco: Never Used  . Alcohol use No     Comment: Once every 6 months.     Review of Systems  Constitutional:   No fever or chills.  ENT:   No sore throat. No rhinorrhea. Cardiovascular:   No chest pain or syncope. Respiratory:   No dyspnea or cough. Gastrointestinal:   Negative for abdominal pain, vomiting and diarrhea.  Musculoskeletal:   Negative  for focal pain or swelling All other systems reviewed and are negative except as documented above in ROS and HPI.  ____________________________________________   PHYSICAL EXAM:  VITAL SIGNS: ED Triage Vitals  Enc Vitals Group     BP 12/03/16 1738 131/87     Pulse Rate 12/03/16 1738 98     Resp 12/03/16 1738 18     Temp 12/03/16 1738 98.2 F (36.8 C)     Temp Source 12/03/16 1738 Oral     SpO2 12/03/16 1738 100 %     Weight 12/03/16 1735 210 lb (95.3 kg)     Height 12/03/16 1735 5\' 3"  (1.6 m)     Head Circumference --      Peak Flow --      Pain Score 12/03/16 1735 6     Pain Loc --      Pain Edu? --      Excl. in [place]? --     Vital signs reviewed, nursing assessments reviewed.   Constitutional:   Alert and oriented. Well appearing and in no distress. Eyes:   No scleral icterus. No conjunctival pallor. PERRL. EOMI.  No nystagmus. ENT   Head:   Normocephalic and atraumatic.   Nose:   No congestion/rhinnorhea.    Mouth/Throat:   Slightly dry mucous members, no pharyngeal erythema. No peritonsillar mass.    Neck:   No meningismus. Full ROM Hematological/Lymphatic/Immunilogical:   No cervical lymphadenopathy. Cardiovascular:   RRR. Symmetric bilateral radial and DP pulses.  No murmurs.  Respiratory:   Normal respiratory effort without tachypnea/retractions. Breath sounds are clear and equal bilaterally. No wheezes/rales/rhonchi. Gastrointestinal:   Soft and nontender. Non distended. There is no CVA tenderness.  No rebound, rigidity, or guarding. Genitourinary:   deferred Musculoskeletal:   Normal range of motion in all extremities. No joint effusions.  No lower extremity tenderness.  Trace pitting edema bilateral ankles. No calf tenderness, negative Homans sign. Neurologic:   Normal speech and language.  Motor grossly intact. No gross focal neurologic deficits are appreciated.  Skin:    Skin is warm, dry and intact. No rash noted.  No petechiae, purpura, or  bullae.  ____________________________________________    LABS (pertinent positives/negatives) (all labs ordered are listed, but only abnormal results are displayed) Labs Reviewed  URINALYSIS, COMPLETE (UACMP) WITH MICROSCOPIC - Abnormal; Notable for the following:       Result Value   Color, Urine STRAW (*)    APPearance CLEAR (*)    Bacteria, UA RARE (*)    Squamous Epithelial / LPF 0-5 (*)    All other components within normal limits  BASIC METABOLIC PANEL  CBC   ____________________________________________   EKG  Interpreted by me  Date: 12/03/2016  Rate: 80  Rhythm: normal sinus rhythm  QRS Axis: normal  Intervals: normal  ST/T Wave abnormalities: normal  Conduction Disutrbances: none  Narrative Interpretation: unremarkable      ____________________________________________    RADIOLOGY  No results found.  ____________________________________________   PROCEDURES Procedures  ____________________________________________   INITIAL IMPRESSION / ASSESSMENT AND PLAN / ED COURSE  Pertinent labs & imaging results that were available during my care of the patient were reviewed by me and considered in my medical decision making (see chart for details).  Patient well appearing no acute distress. Presents with intermittent dizziness in the setting of 11 week pregnancy. Likely due to period Of volume expansion causing relative dehydration. Counseled to increase her water intake as well as some salty snacks like pretzels or potato chips along with this for the next week or 2. Continue her appointment tomorrow. Low suspicion for DVT PE venous sinus thrombosis cavernous sinus thrombosis preeclampsia. No evidence of acute infection. Normal vital signs. Suitable for outpatient follow-up.  Clinical Course as of Dec 03 1909  Wed Dec 03, 2016  1834 Protein: NEGATIVE [PS]    Clinical Course User Index [PS] Carrie Mew, MD      ____________________________________________   FINAL CLINICAL IMPRESSION(S) / ED DIAGNOSES  Final diagnoses:  Dizziness  First trimester pregnancy      New Prescriptions   No medications on file     Portions of this note were generated with dragon dictation software. Dictation errors may occur despite best attempts at proofreading.    Carrie Mew, MD 12/03/16 [phone]

## 2016-12-03 NOTE — ED Triage Notes (Signed)
Pt is [redacted] weeks pregnant.  Here for lightheaded feeling that has been intermittent over past 2 days.  Will feel like may pass out at times.  Also c/o headache for past 2 days and increase in BP today.  Reports BP earlier 154/110.  Has also had some nausea.

## 2016-12-04 ENCOUNTER — Encounter: Payer: Self-pay | Admitting: Obstetrics and Gynecology

## 2016-12-04 ENCOUNTER — Ambulatory Visit (INDEPENDENT_AMBULATORY_CARE_PROVIDER_SITE_OTHER): Payer: Managed Care, Other (non HMO) | Admitting: Obstetrics and Gynecology

## 2016-12-04 ENCOUNTER — Other Ambulatory Visit: Payer: Self-pay | Admitting: Obstetrics and Gynecology

## 2016-12-04 VITALS — BP 106/70 | HR 87 | Wt 217.0 lb

## 2016-12-04 DIAGNOSIS — Z3A11 11 weeks gestation of pregnancy: Secondary | ICD-10-CM

## 2016-12-04 LAB — POCT URINALYSIS DIPSTICK
Bilirubin, UA: NEGATIVE
Blood, UA: NEGATIVE
Glucose, UA: NEGATIVE
Ketones, UA: NEGATIVE
LEUKOCYTES UA: NEGATIVE
NITRITE UA: NEGATIVE
PH UA: 7 (ref 5.0–8.0)
PROTEIN UA: NEGATIVE
Spec Grav, UA: 1.01 (ref 1.010–1.025)
Urobilinogen, UA: 0.2 E.U./dL

## 2016-12-04 MED ORDER — RANITIDINE HCL 300 MG PO TABS: 300.0000 mg | ORAL_TABLET | Freq: Two times a day (BID) | ORAL | 3 refills | Status: DC

## 2016-12-04 NOTE — Progress Notes (Signed)
NOB physical- pt went to ER yesterday, BP elevated, headache, she is still with headache, states she doesn't feel right

## 2016-12-04 NOTE — Progress Notes (Signed)
NEW OB HISTORY AND PHYSICAL  SUBJECTIVE:       Kathryn Lozano is a 31 y.o. [id number] female, Patient's last menstrual period was 09/16/2016 (exact date)., Estimated Date of Delivery: 06/23/17, [redacted]w[redacted]d, presents today for establishment of Prenatal Care. She has no unusual complaints and complains of headache and acid reflux is worse- causes chronic cough      Gynecologic History Patient's last menstrual period was 09/16/2016 (exact date). Normal Contraception: none Last Pap: 07/15. Results were: normal  Obstetric History OB History  Gravida Para Term Preterm AB Living  2       1    SAB TAB Ectopic Multiple Live Births  1            # Outcome Date GA Lbr Len/2nd Weight Sex Delivery Anes PTL Lv  2 Current           1 SAB 2017 [redacted]w[redacted]d    SAB         Past Medical History:  Diagnosis Date  . ADHD (attention deficit hyperactivity disorder)   . Anxiety   . Heartburn   . Migraines   . PONV (postoperative nausea and vomiting)    Pt reports nausea, "sensitive stomach"  . Renal disorder    KIDNEY STONES    Past Surgical History:  Procedure Laterality Date  . CHOLECYSTECTOMY N/A 09/14/2014   Procedure: LAPAROSCOPIC CHOLECYSTECTOMY;  Surgeon: Coralie Keens, MD;  Location: [place];  Service: General;  Laterality: N/A;  . CYSTOSCOPY W/ URETERAL STENT PLACEMENT Left 01/30/2016   Procedure: CYSTOSCOPY WITH STENT REPLACEMENT;  Surgeon: Hollice Espy, MD;  Location: [facility] ORS;  Service: Urology;  Laterality: Left;  . CYSTOSCOPY WITH STENT PLACEMENT  01/24/2016   Procedure: CYSTOSCOPY WITH STENT PLACEMENT;  Surgeon: Hollice Espy, MD;  Location: [facility] ORS;  Service: Urology;;  . LITHOTRIPSY  2005  . URETEROSCOPY  01/24/2016   Procedure: URETEROSCOPY;  Surgeon: Hollice Espy, MD;  Location: [facility] ORS;  Service: Urology;;  . URETEROSCOPY Left 01/30/2016   Procedure: URETEROSCOPY;  Surgeon: Hollice Espy, MD;  Location: [facility] ORS;  Service: Urology;  Laterality: Left;  . WISDOM TOOTH EXTRACTION       Current Outpatient Prescriptions on File Prior to Visit  Medication Sig Dispense Refill  . montelukast (SINGULAIR) 10 MG tablet TAKE 1 TABLET BY MOUTH EVERY DAY AS DIRECTED 30 tablet 11  . Prenatal Vit-Fe Fumarate-FA (PRENATAL MULTIVITAMIN) TABS tablet Take 1 tablet by mouth daily at 12 noon.    . ranitidine (ZANTAC) 150 MG tablet Take 1 tablet by mouth at night (Patient taking differently: Take 150 mg by mouth every morning. Take 1 tablet by mouth at night) 90 tablet 3   No current facility-administered medications on file prior to visit.     Allergies  Allergen Reactions  . Roxicet [Oxycodone-Acetaminophen] Itching    Pt states that she is itching all over her body.     Social History   Social History  . Marital status: Married    Spouse name: N/A  . Number of children: N/A  . Years of education: N/A   Occupational History  . Not on file.   Social History Main Topics  . Smoking status: Never Smoker  . Smokeless tobacco: Never Used  . Alcohol use No     Comment: Once every 6 months.   . Drug use: No  . Sexual activity: Yes    Birth control/ protection: None   Other Topics Concern  . Not on file   Social  History Narrative  . No narrative on file    Family History  Problem Relation Age of Onset  . Alzheimer's disease Paternal Uncle   . Thyroid cancer Paternal Uncle   . Alzheimer's disease Paternal Grandmother   . Kidney cancer Neg Hx   . Bladder Cancer Neg Hx   . Breast cancer Neg Hx   . Rashes / Skin problems Neg Hx   . Colon cancer Neg Hx   . Diabetes Neg Hx     The following portions of the patient's history were reviewed and updated as appropriate: allergies, current medications, past OB history, past medical history, past surgical history, past family history, past social history, and problem list.    OBJECTIVE: Initial Physical Exam (New OB)  GENERAL APPEARANCE: alert, well appearing, in no apparent distress, oriented to person, place and time,  overweight HEAD: normocephalic, atraumatic MOUTH: mucous membranes moist, pharynx normal without lesions and dental hygiene good THYROID: no thyromegaly or masses present BREASTS: not examined LUNGS: clear to auscultation, no wheezes, rales or rhonchi, symmetric air entry HEART: regular rate and rhythm, no murmurs ABDOMEN: soft, nontender, nondistended, no abnormal masses, no epigastric pain, obese and fundus not palpable EXTREMITIES: no redness or tenderness in the calves or thighs SKIN: normal coloration and turgor, no rashes LYMPH NODES: no adenopathy palpable NEUROLOGIC: alert, oriented, normal speech, no focal findings or movement disorder noted  PELVIC EXAM EXTERNAL GENITALIA: normal appearing vulva with no masses, tenderness or lesions VAGINA: no abnormal discharge or lesions CERVIX: no lesions or cervical motion tenderness UTERUS: gravid, consistent with 11 weeks and retroverted  ASSESSMENT: Normal pregnancy Elevated BMI Headaches   PLAN: Prenatal care Desires genetic screening-lab obtained today Early glucola next visit See orders

## 2016-12-04 NOTE — Patient Instructions (Signed)

## 2016-12-06 IMAGING — US US RENAL
1 series · 14 of 25 positions shown · non-contrast
Comparison: 01/18/2016

CLINICAL DATA: Kidney stones. History of stones to remove left
kidney stone 3 weeks ago.

EXAM:
RENAL / URINARY TRACT ULTRASOUND COMPLETE

[Series 1: us renal · 0.23mm/px · 14 of 34 slices shown]
[im 1/34]
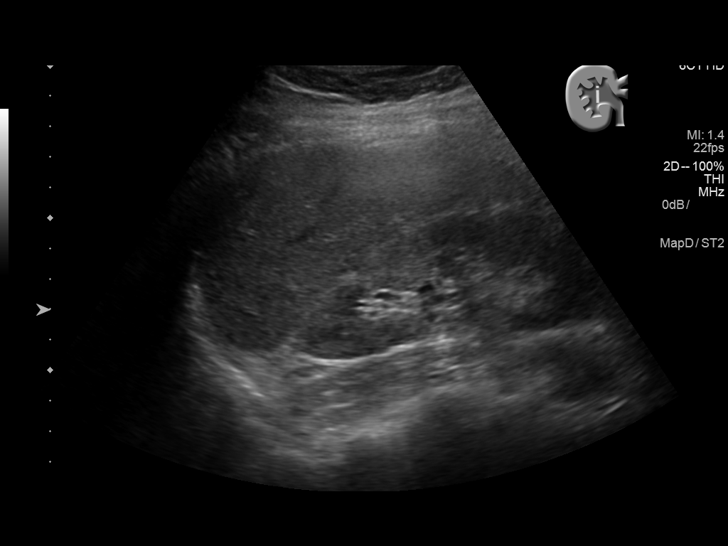
[im 3/34]
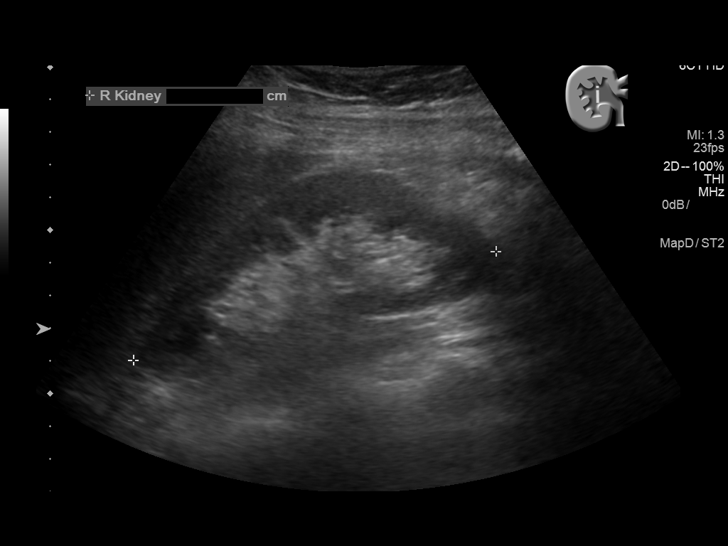
[im 6/34]
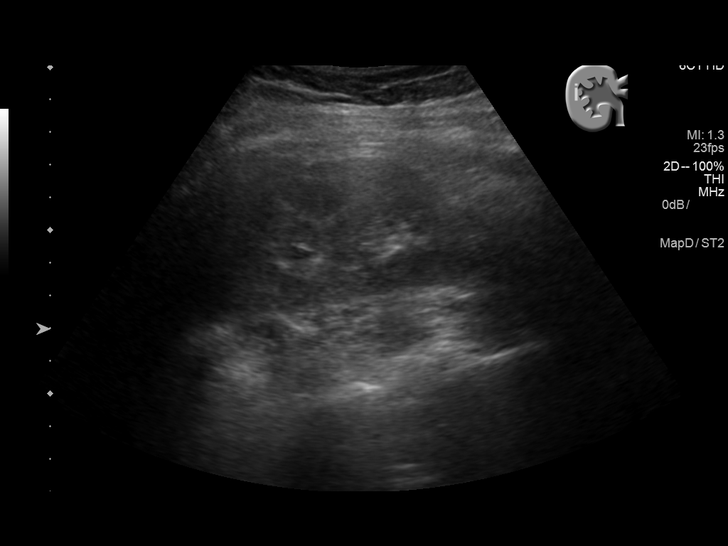
[im 9/34]
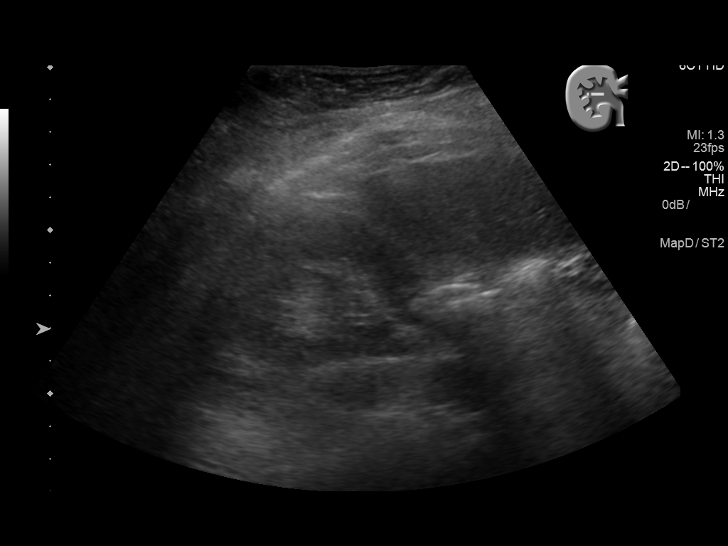
[im 12/34]
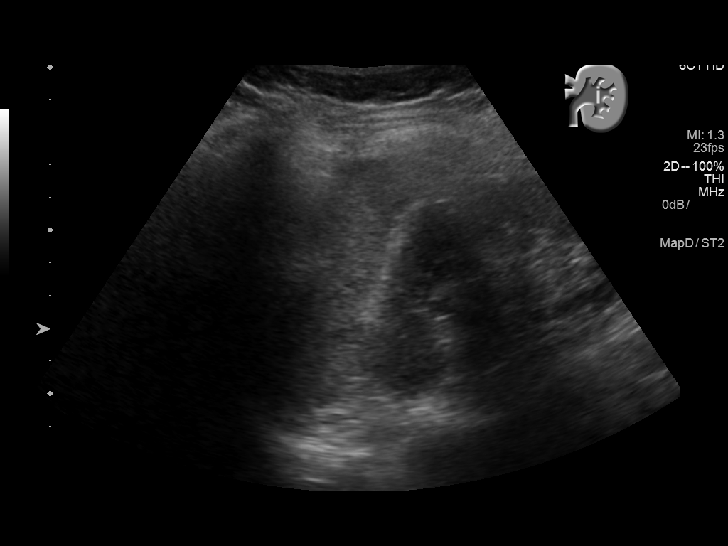
[im 13/34]
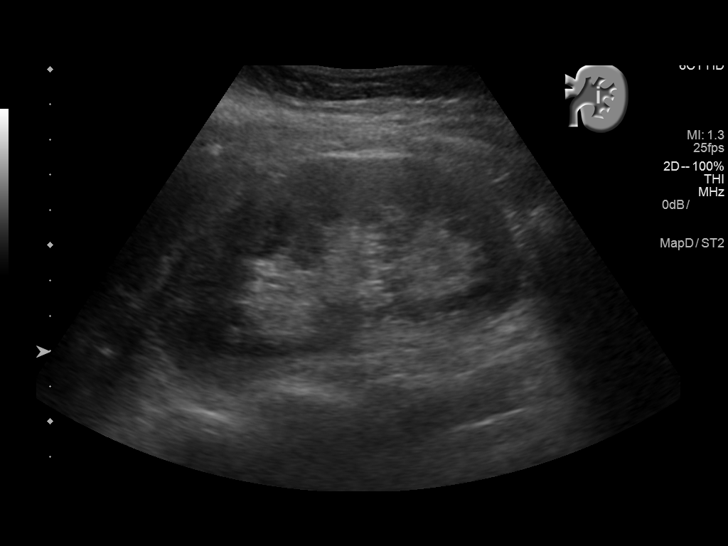
[im 16/34]
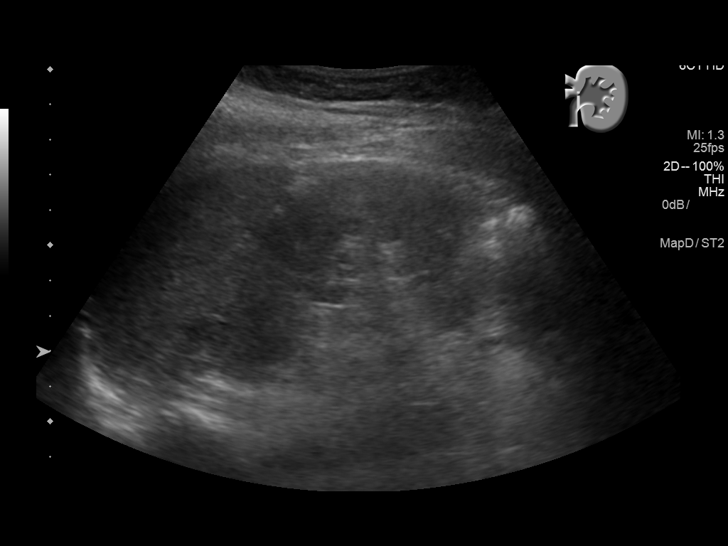
[im 18/34]
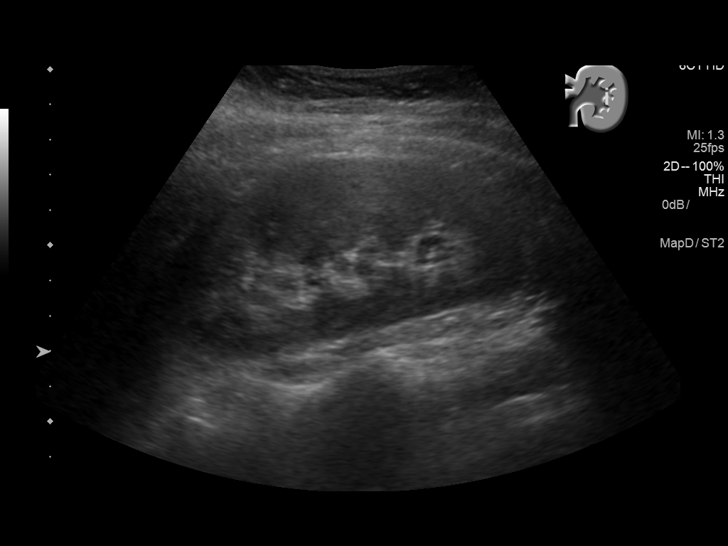
[im 21/34]
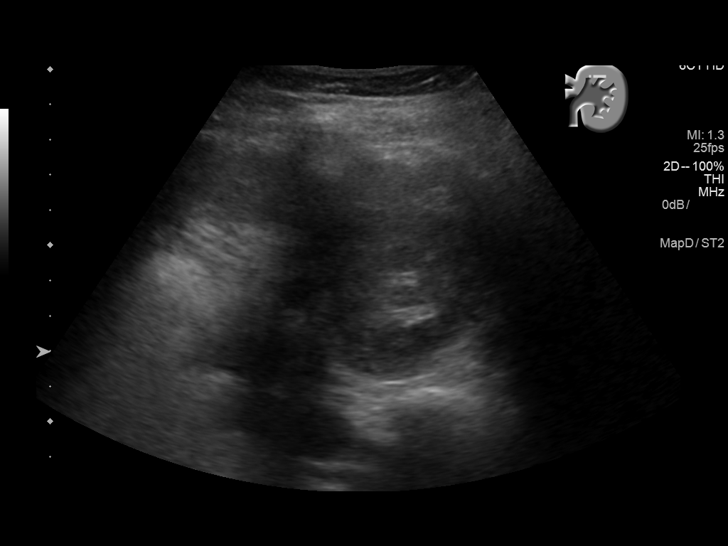
[im 23/34]
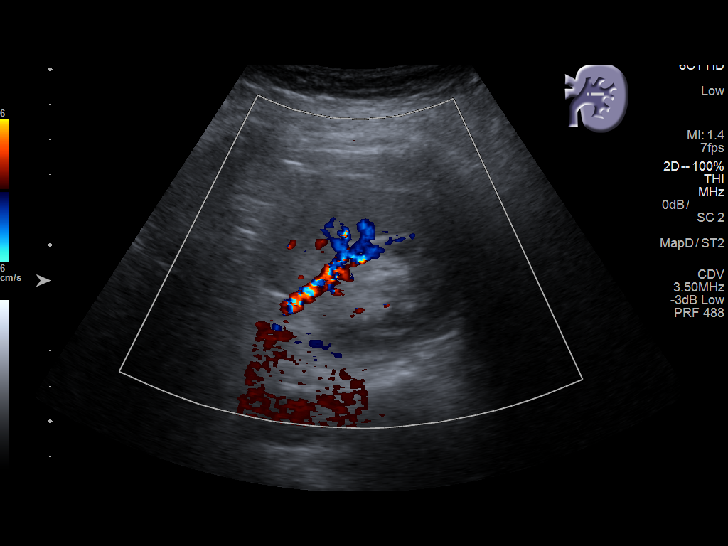
[im 25/34]
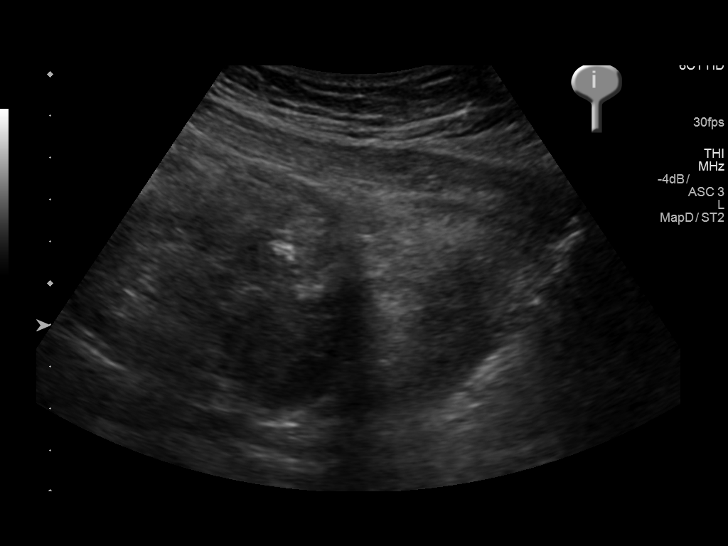
[im 28/34]
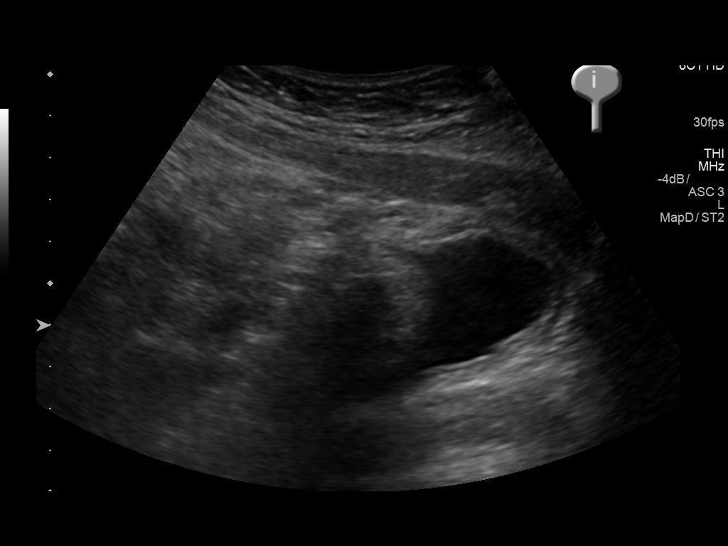
[im 31/34]
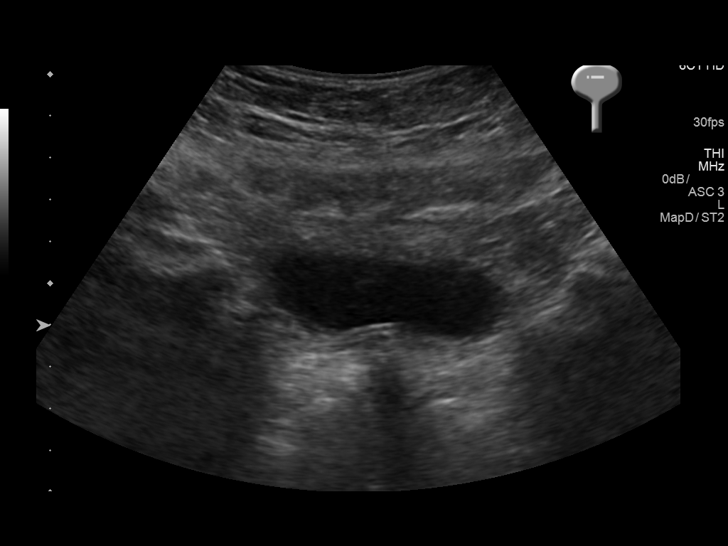
[im 34/34]
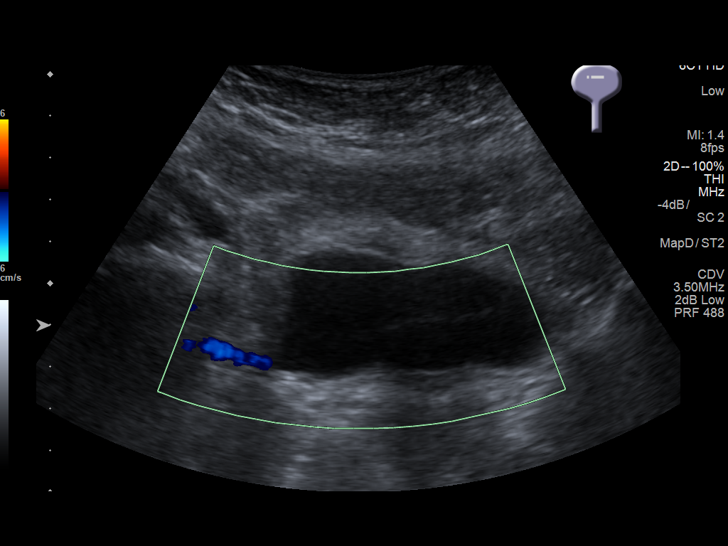

[14 of 25 positions shown; findings below may reference images not displayed]

FINDINGS: Right Kidney:

Length: 11.6 cm. Echogenicity within normal limits. No mass or
hydronephrosis visualized.

Left Kidney:

Length: 10.5 cm. Echogenicity within normal limits. No mass or
hydronephrosis visualized.

Bladder:

Appears normal for degree of bladder distention.
IMPRESSION: Normal evaluation of the kidneys and bladder.

## 2016-12-08 ENCOUNTER — Telehealth: Payer: Self-pay | Admitting: Obstetrics and Gynecology

## 2016-12-08 ENCOUNTER — Encounter: Payer: Self-pay | Admitting: Obstetrics and Gynecology

## 2016-12-08 LAB — CYTOLOGY - PAP

## 2016-12-08 NOTE — Telephone Encounter (Signed)
Urgent care gave her amoxicillin for strep throat and she wants to confirm that this is okay to take during pregnancy Please call asap

## 2016-12-08 NOTE — Telephone Encounter (Signed)
Notified pt rx is fine to take

## 2016-12-10 ENCOUNTER — Encounter: Payer: Self-pay | Admitting: Obstetrics and Gynecology

## 2016-12-18 ENCOUNTER — Telehealth: Payer: Self-pay | Admitting: Obstetrics and Gynecology

## 2016-12-18 ENCOUNTER — Encounter: Payer: Self-pay | Admitting: Obstetrics and Gynecology

## 2016-12-18 NOTE — Telephone Encounter (Signed)
Patient would like to speak with you concerning her work requirements and getting restrictions  Please call

## 2016-12-22 ENCOUNTER — Encounter: Payer: Self-pay | Admitting: Obstetrics and Gynecology

## 2016-12-22 ENCOUNTER — Encounter: Payer: Self-pay | Admitting: *Deleted

## 2016-12-22 NOTE — Telephone Encounter (Signed)
Work note done

## 2016-12-26 ENCOUNTER — Encounter: Payer: Managed Care, Other (non HMO) | Admitting: Obstetrics and Gynecology

## 2017-01-02 ENCOUNTER — Ambulatory Visit (INDEPENDENT_AMBULATORY_CARE_PROVIDER_SITE_OTHER): Payer: Managed Care, Other (non HMO) | Admitting: Certified Nurse Midwife

## 2017-01-02 ENCOUNTER — Other Ambulatory Visit: Payer: Managed Care, Other (non HMO)

## 2017-01-02 VITALS — BP 104/70 | HR 88 | Wt 222.0 lb

## 2017-01-02 DIAGNOSIS — Z3689 Encounter for other specified antenatal screening: Secondary | ICD-10-CM

## 2017-01-02 DIAGNOSIS — O99210 Obesity complicating pregnancy, unspecified trimester: Secondary | ICD-10-CM

## 2017-01-02 DIAGNOSIS — Z3482 Encounter for supervision of other normal pregnancy, second trimester: Secondary | ICD-10-CM

## 2017-01-02 DIAGNOSIS — Z131 Encounter for screening for diabetes mellitus: Secondary | ICD-10-CM

## 2017-01-02 LAB — POCT URINALYSIS DIPSTICK
Bilirubin, UA: NEGATIVE
Blood, UA: NEGATIVE
Glucose, UA: NEGATIVE
Ketones, UA: NEGATIVE
LEUKOCYTES UA: NEGATIVE
NITRITE UA: NEGATIVE
PH UA: 8.5 — AB (ref 5.0–8.0)
PROTEIN UA: NEGATIVE
Spec Grav, UA: <=1.005 — AB (ref 1.010–1.025)
Urobilinogen, UA: 0.2 E.U./dL

## 2017-01-02 NOTE — Patient Instructions (Signed)
Common Medications Safe in Pregnancy  Acne:      Constipation:  Benzoyl Peroxide     Colace  Clindamycin      Dulcolax Suppository  Topica Erythromycin     Fibercon  Salicylic Acid      Metamucil         Miralax AVOID:        Senakot   Accutane    Cough:  Retin-A       Cough Drops  Tetracycline      Phenergan w/ Codeine if Rx  Minocycline      Robitussin (Plain & DM)  Antibiotics:     Crabs/Lice:  Ceclor       RID  Cephalosporins    AVOID:  E-Mycins      Kwell  Keflex  Macrobid/Macrodantin   Diarrhea:  Penicillin      Kao-Pectate  Zithromax      Imodium AD         PUSH FLUIDS AVOID:       Cipro     Fever:  Tetracycline      Tylenol (Regular or Extra  Minocycline       Strength)  Levaquin      Extra Strength-Do not          Exceed 8 tabs/24 hrs Caffeine:        <200mg/day (equiv. To 1 cup of coffee or  approx. 3 12 oz sodas)         Gas: Cold/Hayfever:       Gas-X  Benadryl      Mylicon  Claritin       Phazyme  **Claritin-D        Chlor-Trimeton    Headaches:  Dimetapp      ASA-Free Excedrin  Drixoral-Non-Drowsy     Cold Compress  Mucinex (Guaifenasin)     Tylenol (Regular or Extra  Sudafed/Sudafed-12 Hour     Strength)  **Sudafed PE Pseudoephedrine   Tylenol Cold & Sinus     Vicks Vapor Rub  Zyrtec  **AVOID if Problems With Blood Pressure         Heartburn: Avoid lying down for at least 1 hour after meals  Aciphex      Maalox     Rash:  Milk of Magnesia     Benadryl    Mylanta       1% Hydrocortisone Cream  Pepcid  Pepcid Complete   Sleep Aids:  Prevacid      Ambien   Prilosec       Benadryl  Rolaids       Chamomile Tea  Tums (Limit 4/day)     Unisom  Zantac       Tylenol PM         Warm milk-add vanilla or  Hemorrhoids:       Sugar for taste  Anusol/Anusol H.C.  (RX: Analapram 2.5%)  Sugar Substitutes:  Hydrocortisone OTC     Ok in moderation  Preparation H      Tucks        Vaseline lotion applied to tissue with  wiping    Herpes:     Throat:  Acyclovir      Oragel  Famvir  Valtrex     Vaccines:         Flu Shot Leg Cramps:       *Gardasil  Benadryl      Hepatitis A         Hepatitis B Nasal Spray:         Pneumovax  Saline Nasal Spray     Polio Booster         Tetanus Nausea:       Tuberculosis test or PPD  Vitamin B6 25 mg TID   AVOID:    Dramamine      *Gardasil  Emetrol       Live Poliovirus  Ginger Root 250 mg QID    MMR (measles, mumps &  High Complex Carbs @ Bedtime    rebella)  Sea Bands-Accupressure    Varicella (Chickenpox)  Unisom 1/2 tab TID     *No known complications           If received before Pain:         Known pregnancy;   Darvocet       Resume series after  Lortab        Delivery  Percocet    Yeast:   Tramadol      Femstat  Tylenol 3      Gyne-lotrimin  Ultram       Monistat  Vicodin           MISC:         All Sunscreens           Hair Coloring/highlights          Insect Repellant's          (Including DEET)         Mystic Tans Round Ligament Pain The round ligament is a cord of muscle and tissue that helps to support the uterus. It can become a source of pain during pregnancy if it becomes stretched or twisted as the baby grows. The pain usually begins in the second trimester of pregnancy, and it can come and go until the baby is delivered. It is not a serious problem, and it does not cause harm to the baby. Round ligament pain is usually a short, sharp, and pinching pain, but it can also be a dull, lingering, and aching pain. The pain is felt in the lower side of the abdomen or in the groin. It usually starts deep in the groin and moves up to the outside of the hip area. Pain can occur with:  A sudden change in position.  Rolling over in bed.  Coughing or sneezing.  Physical activity.  Follow these instructions at home: Watch your condition for any changes. Take these steps to help with your pain:  When the pain starts, relax. Then try: ? Sitting  down. ? Flexing your knees up to your abdomen. ? Lying on your side with one pillow under your abdomen and another pillow between your legs. ? Sitting in a warm bath for 15-20 minutes or until the pain goes away.  Take over-the-counter and prescription medicines only as told by your health care provider.  Move slowly when you sit and stand.  Avoid long walks if they cause pain.  Stop or lessen your physical activities if they cause pain.  Contact a health care provider if:  Your pain does not go away with treatment.  You feel pain in your back that you did not have before.  Your medicine is not helping. Get help right away if:  You develop a fever or chills.  You develop uterine contractions.  You develop vaginal bleeding.  You develop nausea or vomiting.  You develop diarrhea.  You have pain when you urinate. This information is not intended to replace advice given to you by your health  care provider. Make sure you discuss any questions you have with your health care provider. Document Released: 04/01/2008 Document Revised: 11/29/2015 Document Reviewed: 08/30/2014 Elsevier Interactive Patient Education  2018 Elsevier Inc.  

## 2017-01-03 LAB — GLUCOSE, 1 HOUR GESTATIONAL: GESTATIONAL DIABETES SCREEN: 98 mg/dL (ref 65–139)

## 2017-01-05 NOTE — Progress Notes (Signed)
ROB-Pt doing well, reports lower extremity swelling after work that resolves with rest and elevation. Discussed home treatment measures including compression stockings, abdominal support, hydration, and warm baths with epsom salt. Early glucola today from BMI > 30, will contact pt via MyChart with results. Reviewed red flag symptoms and when to call. RTC x 4-5 weeks for anatomy scan and ROB.

## 2017-01-08 ENCOUNTER — Encounter: Payer: Self-pay | Admitting: Obstetrics and Gynecology

## 2017-01-09 ENCOUNTER — Encounter: Payer: Self-pay | Admitting: *Deleted

## 2017-01-21 ENCOUNTER — Encounter: Payer: Self-pay | Admitting: Certified Nurse Midwife

## 2017-01-21 ENCOUNTER — Ambulatory Visit (INDEPENDENT_AMBULATORY_CARE_PROVIDER_SITE_OTHER): Payer: Managed Care, Other (non HMO) | Admitting: Certified Nurse Midwife

## 2017-01-21 ENCOUNTER — Other Ambulatory Visit: Payer: Self-pay | Admitting: Certified Nurse Midwife

## 2017-01-21 ENCOUNTER — Encounter: Payer: Self-pay | Admitting: Obstetrics and Gynecology

## 2017-01-21 ENCOUNTER — Other Ambulatory Visit (INDEPENDENT_AMBULATORY_CARE_PROVIDER_SITE_OTHER): Payer: Managed Care, Other (non HMO)

## 2017-01-21 VITALS — BP 105/60 | HR 99 | Wt 229.5 lb

## 2017-01-21 DIAGNOSIS — R109 Unspecified abdominal pain: Secondary | ICD-10-CM | POA: Diagnosis not present

## 2017-01-21 DIAGNOSIS — O3680X0 Pregnancy with inconclusive fetal viability, not applicable or unspecified: Secondary | ICD-10-CM | POA: Diagnosis not present

## 2017-01-21 DIAGNOSIS — O26899 Other specified pregnancy related conditions, unspecified trimester: Secondary | ICD-10-CM | POA: Diagnosis not present

## 2017-01-21 DIAGNOSIS — Z3482 Encounter for supervision of other normal pregnancy, second trimester: Secondary | ICD-10-CM

## 2017-01-21 DIAGNOSIS — O26892 Other specified pregnancy related conditions, second trimester: Secondary | ICD-10-CM

## 2017-01-21 LAB — POCT URINALYSIS DIPSTICK
Bilirubin, UA: NEGATIVE
Blood, UA: NEGATIVE
Glucose, UA: NEGATIVE
KETONES UA: NEGATIVE
Leukocytes, UA: NEGATIVE
Nitrite, UA: NEGATIVE
Protein, UA: NEGATIVE
SPEC GRAV UA: 1.015 (ref 1.010–1.025)
Urobilinogen, UA: 0.2 E.U./dL
pH, UA: 7 (ref 5.0–8.0)

## 2017-01-21 NOTE — Patient Instructions (Signed)

## 2017-01-21 NOTE — Progress Notes (Signed)
PT presents today for problem visit. She states that she has had significant lower left quadrant pressure for the past 2 hours along with right sided round ligament pain.  She denies LOF, vaginal bleeding, contractions or cramping. She also denies nausea, vomiting, fever, illness,  problems with constipation or passing gas. Her abdomen palpates soft, no signs of contractions. She is unsure if she is feeling movement. Speculum exam completed, white particulate discharge noted with no odor present. Cervix appears closed. FFN collected and Nuswab.Marland Kitchen SVE closed. Ultrasound completed to evaluated for concealed bleeding. U/s Shows : 18 week Viable Singleton Intrauterine pregnancy by U/S. 2. (U/S) EDD is consistent with Clinically established (LMP) EDD of 06/23/17. 3. Maternal uterus, ovaries and adnexa appear WNL. No obvious signs of placental abruption are noted. (see note below). She states that she feels better knowing that baby is ok. Red flag symptoms reviewed. Follow up as scheduled.   Philip Aspen, CNM   ULTRASOUND REPORT  Location: [facility] Date of Service: 01/21/17  Indications: Acute severe abdominal pain at [redacted] weeks gestation Findings:  Singleton intrauterine pregnancy is visualized with FHR at 144 BPM. Biometrics give an (U/S) Gestational age of [redacted] weeks and an (U/S) EDD of 06/24/17; this correlates with the clinically established EDD of 06/23/17.  Fetal presentation is breech, spine posterior.  EFW: 229 grams ( 0 lbs. 8 oz.). Placenta: Anterior, grade 0 and remote to cervix at 3.5 cm. No obvious signs of abruption are noted.  AFI: Subjectively adequate.  This is an emergent ultrasound and a detailed anatomy was not performed today.   Right Ovary measures 3.8 x 1.7 x 2.5 cm, and appears WNL. Left Ovary measures 4.5 x 3.1 x 2.1 cm, and appears WNL. There is no obvious evidence of a corpus luteal cyst. Survey of the adnexa demonstrates no adnexal masses. There is no free  peritoneal fluid in the cul de sac.  Impression: 1. 18 week Viable Singleton Intrauterine pregnancy by U/S. 2. (U/S) EDD is consistent with Clinically established (LMP) EDD of 06/23/17. 3. Maternal uterus, ovaries and adnexa appear WNL. No obvious signs of placental abruption are noted.  Recommendations: 1.Clinical correlation with the patient's History and Physical Exam. 2. Patient already has an appointment on 02/06/17 for a detailed anatomy scan.  Macarthur Critchley, RDMS, RVT

## 2017-01-22 LAB — FETAL FIBRONECTIN: Fetal Fibronectin: NEGATIVE

## 2017-01-23 ENCOUNTER — Encounter: Payer: Self-pay | Admitting: Certified Nurse Midwife

## 2017-01-29 LAB — NUSWAB VAGINITIS PLUS (VG+)
CANDIDA GLABRATA, NAA: NEGATIVE
CHLAMYDIA TRACHOMATIS, NAA: NEGATIVE
Candida albicans, NAA: NEGATIVE
NEISSERIA GONORRHOEAE, NAA: NEGATIVE
TRICH VAG BY NAA: NEGATIVE

## 2017-01-30 ENCOUNTER — Encounter: Payer: Self-pay | Admitting: Certified Nurse Midwife

## 2017-01-30 ENCOUNTER — Other Ambulatory Visit: Payer: Self-pay | Admitting: Certified Nurse Midwife

## 2017-01-30 DIAGNOSIS — Z369 Encounter for antenatal screening, unspecified: Secondary | ICD-10-CM

## 2017-02-02 ENCOUNTER — Encounter: Payer: Self-pay | Admitting: Certified Nurse Midwife

## 2017-02-05 ENCOUNTER — Encounter: Payer: Self-pay | Admitting: Certified Nurse Midwife

## 2017-02-06 ENCOUNTER — Ambulatory Visit (INDEPENDENT_AMBULATORY_CARE_PROVIDER_SITE_OTHER): Payer: Managed Care, Other (non HMO) | Admitting: Certified Nurse Midwife

## 2017-02-06 ENCOUNTER — Ambulatory Visit (INDEPENDENT_AMBULATORY_CARE_PROVIDER_SITE_OTHER): Payer: Managed Care, Other (non HMO)

## 2017-02-06 VITALS — BP 115/70 | HR 89 | Wt 228.8 lb

## 2017-02-06 DIAGNOSIS — Z3482 Encounter for supervision of other normal pregnancy, second trimester: Secondary | ICD-10-CM

## 2017-02-06 DIAGNOSIS — Z369 Encounter for antenatal screening, unspecified: Secondary | ICD-10-CM

## 2017-02-06 LAB — POCT URINALYSIS DIPSTICK
BILIRUBIN UA: NEGATIVE
Blood, UA: NEGATIVE
GLUCOSE UA: NEGATIVE
Ketones, UA: NEGATIVE
LEUKOCYTES UA: NEGATIVE
Nitrite, UA: NEGATIVE
Protein, UA: NEGATIVE
Spec Grav, UA: <=1.005 — AB (ref 1.010–1.025)
Urobilinogen, UA: 0.2 E.U./dL
pH, UA: 7 (ref 5.0–8.0)

## 2017-02-06 NOTE — Patient Instructions (Signed)
How a Baby Grows During Pregnancy Pregnancy begins when a female's sperm enters a female's egg (fertilization). This happens in one of the tubes (fallopian tubes) that connect the ovaries to the womb (uterus). The fertilized egg is called an embryo until it reaches 10 weeks. From 10 weeks until birth, it is called a fetus. The fertilized egg moves down the fallopian tube to the uterus. Then it implants into the lining of the uterus and begins to grow. The developing fetus receives oxygen and nutrients through the pregnant woman's bloodstream and the tissues that grow (placenta) to support the fetus. The placenta is the life support system for the fetus. It provides nutrition and removes waste. Learning as much as you can about your pregnancy and how your baby is developing can help you enjoy the experience. It can also make you aware of when there might be a problem and when to ask questions. How long does a typical pregnancy last? A pregnancy usually lasts 280 days, or about 40 weeks. Pregnancy is divided into three trimesters:  First trimester: 0-13 weeks.  Second trimester: 14-27 weeks.  Third trimester: 28-40 weeks.  The day when your baby is considered ready to be born (full term) is your estimated date of delivery. How does my baby develop month by month? First month  The fertilized egg attaches to the inside of the uterus.  Some cells will form the placenta. Others will form the fetus.  The arms, legs, brain, spinal cord, lungs, and heart begin to develop.  At the end of the first month, the heart begins to beat.  Second month  The bones, inner ear, eyelids, hands, and feet form.  The genitals develop.  By the end of 8 weeks, all major organs are developing.  Third month  All of the internal organs are forming.  Teeth develop below the gums.  Bones and muscles begin to grow. The spine can flex.  The skin is transparent.  Fingernails and toenails begin to form.  Arms  and legs continue to grow longer, and hands and feet develop.  The fetus is about 3 in (7.6 cm) long.  Fourth month  The placenta is completely formed.  The external sex organs, neck, outer ear, eyebrows, eyelids, and fingernails are formed.  The fetus can hear, swallow, and move its arms and legs.  The kidneys begin to produce urine.  The skin is covered with a white waxy coating (vernix) and very fine hair (lanugo).  Fifth month  The fetus moves around more and can be felt for the first time (quickening).  The fetus starts to sleep and wake up and may begin to suck its finger.  The nails grow to the end of the fingers.  The organ in the digestive system that makes bile (gallbladder) functions and helps to digest the nutrients.  If your baby is a girl, eggs are present in her ovaries. If your baby is a boy, testicles start to move down into his scrotum.  Sixth month  The lungs are formed, but the fetus is not yet able to breathe.  The eyes open. The brain continues to develop.  Your baby has fingerprints and toe prints. Your baby's hair grows thicker.  At the end of the second trimester, the fetus is about 9 in (22.9 cm) long.  Seventh month  The fetus kicks and stretches.  The eyes are developed enough to sense changes in light.  The hands can make a grasping motion.  The   fetus responds to sound.  Eighth month  All organs and body systems are fully developed and functioning.  Bones harden and taste buds develop. The fetus may hiccup.  Certain areas of the brain are still developing. The skull remains soft.  Ninth month  The fetus gains about  lb (0.23 kg) each week.  The lungs are fully developed.  Patterns of sleep develop.  The fetus's head typically moves into a head-down position (vertex) in the uterus to prepare for birth. If the buttocks move into a vertex position instead, the baby is breech.  The fetus weighs 6-9 lbs (2.72-4.08 kg) and is  19-20 in (48.26-50.8 cm) long.  What can I do to have a healthy pregnancy and help my baby develop? Eating and Drinking  Eat a healthy diet. ? Talk with your health care provider to make sure that you are getting the nutrients that you and your baby need. ? Visit www.choosemyplate.gov to learn about creating a healthy diet.  Gain a healthy amount of weight during pregnancy as advised by your health care provider. This is usually 25-35 pounds. You may need to: ? Gain more if you were underweight before getting pregnant or if you are pregnant with more than one baby. ? Gain less if you were overweight or obese when you got pregnant.  Medicines and Vitamins  Take prenatal vitamins as directed by your health care provider. These include vitamins such as folic acid, iron, calcium, and vitamin D. They are important for healthy development.  Take medicines only as directed by your health care provider. Read labels and ask a pharmacist or your health care provider whether over-the-counter medicines, supplements, and prescription drugs are safe to take during pregnancy.  Activities  Be physically active as advised by your health care provider. Ask your health care provider to recommend activities that are safe for you to do, such as walking or swimming.  Do not participate in strenuous or extreme sports.  Lifestyle  Do not drink alcohol.  Do not use any tobacco products, including cigarettes, chewing tobacco, or electronic cigarettes. If you need help quitting, ask your health care provider.  Do not use illegal drugs.  Safety  Avoid exposure to mercury, lead, or other heavy metals. Ask your health care provider about common sources of these heavy metals.  Avoid listeria infection during pregnancy. Follow these precautions: ? Do not eat soft cheeses or deli meats. ? Do not eat hot dogs unless they have been warmed up to the point of steaming, such as in the microwave oven. ? Do not  drink unpasteurized milk.  Avoid toxoplasmosis infection during pregnancy. Follow these precautions: ? Do not change your cat's litter box, if you have a cat. Ask someone else to do this for you. ? Wear gardening gloves while working in the yard.  General Instructions  Keep all follow-up visits as directed by your health care provider. This is important. This includes prenatal care and screening tests.  Manage any chronic health conditions. Work closely with your health care provider to keep conditions, such as diabetes, under control.  How do I know if my baby is developing well? At each prenatal visit, your health care provider will do several different tests to check on your health and keep track of your baby's development. These include:  Fundal height. ? Your health care provider will measure your growing belly from top to bottom using a tape measure. ? Your health care provider will also feel your belly   to determine your baby's position.  Heartbeat. ? An ultrasound in the first trimester can confirm pregnancy and show a heartbeat, depending on how far along you are. ? Your health care provider will check your baby's heart rate at every prenatal visit. ? As you get closer to your delivery date, you may have regular fetal heart rate monitoring to make sure that your baby is not in distress.  Second trimester ultrasound. ? This ultrasound checks your baby's development. It also indicates your baby's gender.  What should I do if I have concerns about my baby's development? Always talk with your health care provider about any concerns that you may have. This information is not intended to replace advice given to you by your health care provider. Make sure you discuss any questions you have with your health care provider. Document Released: 12/10/2007 Document Revised: 11/29/2015 Document Reviewed: 11/30/2013 Elsevier Interactive Patient Education  2018 [employer] Round Ligament  Pain The round ligament is a cord of muscle and tissue that helps to support the uterus. It can become a source of pain during pregnancy if it becomes stretched or twisted as the baby grows. The pain usually begins in the second trimester of pregnancy, and it can come and go until the baby is delivered. It is not a serious problem, and it does not cause harm to the baby. Round ligament pain is usually a short, sharp, and pinching pain, but it can also be a dull, lingering, and aching pain. The pain is felt in the lower side of the abdomen or in the groin. It usually starts deep in the groin and moves up to the outside of the hip area. Pain can occur with:  A sudden change in position.  Rolling over in bed.  Coughing or sneezing.  Physical activity.  Follow these instructions at home: Watch your condition for any changes. Take these steps to help with your pain:  When the pain starts, relax. Then try: ? Sitting down. ? Flexing your knees up to your abdomen. ? Lying on your side with one pillow under your abdomen and another pillow between your legs. ? Sitting in a warm bath for 15-20 minutes or until the pain goes away.  Take over-the-counter and prescription medicines only as told by your health care provider.  Move slowly when you sit and stand.  Avoid long walks if they cause pain.  Stop or lessen your physical activities if they cause pain.  Contact a health care provider if:  Your pain does not go away with treatment.  You feel pain in your back that you did not have before.  Your medicine is not helping. Get help right away if:  You develop a fever or chills.  You develop uterine contractions.  You develop vaginal bleeding.  You develop nausea or vomiting.  You develop diarrhea.  You have pain when you urinate. This information is not intended to replace advice given to you by your health care provider. Make sure you discuss any questions you have with your  health care provider. Document Released: 04/01/2008 Document Revised: 11/29/2015 Document Reviewed: 08/30/2014 Elsevier Interactive Patient Education  2018 [employer]vier Inc.  

## 2017-02-06 NOTE — Progress Notes (Signed)
ROB, doing well. U/S today for anatomy not complete, need to return in 1-2 wks. ( see below) . She has difficulty feeling movement due to anterior placental location. Discussed child birth classes and upcoming Long course. PTL precautions reviewed. She will follow up 1-2 wk for u/s then 4 wks for ROB.  Philip Aspen, CNM  ULTRASOUND REPORT Location: [facility] Date of Service: 02/06/17  Indications: Anatomy Findings:  Singleton intrauterine pregnancy is visualized with FHR at 143 BPM. Biometrics give an (U/S) Gestational age of [redacted] weeks 3 days, and an (U/S) EDD of 06/23/17; this correlates with the clinically established EDD of 06/23/17.  Fetal presentation is breech, spine variable.  EFW: 363 grams ( 0 lbs. 13 oz). Placenta: Anterior, grade 1 and remote to cervix at 3.4 cm. AFI: Subjectively adequate with an MVP of 3.4 cm.  Anatomic survey is incomplete due to fetal position. Anatomy seen today appears WNL. Only anatomy needed to complete scan is a 5th digit. Gender - Female .   Right Ovary measures 3.1 x 1.8 x 2.3 cm, and appears WNL. Left Ovary measures 3.6 x 1.5 x 2.2 cm, and appears WNL. There is no evidence of a corpus luteal cyst. Survey of the adnexa demonstrates no adnexal masses. There is no free peritoneal fluid in the cul de sac.  Impression: 1. 20 week 3 day Viable Singleton Intrauterine pregnancy by U/S. 2. (U/S) EDD is consistent with Clinically established (LMP) EDD of 06/23/17. 3. Incomplete anatomy scan. Anatomy needed: 5th digit only  Recommendations: 1.Clinical correlation with the patient's History and Physical Exam. 2. Patient will return in 1-2 weeks to complete exam.  Macarthur Critchley, RDMS, RVT

## 2017-02-11 ENCOUNTER — Other Ambulatory Visit: Payer: Self-pay | Admitting: Certified Nurse Midwife

## 2017-02-11 DIAGNOSIS — IMO0002 Reserved for concepts with insufficient information to code with codable children: Secondary | ICD-10-CM

## 2017-02-11 DIAGNOSIS — Z0489 Encounter for examination and observation for other specified reasons: Secondary | ICD-10-CM

## 2017-02-16 ENCOUNTER — Encounter: Payer: Self-pay | Admitting: Emergency Medicine

## 2017-02-16 ENCOUNTER — Emergency Department: Payer: Managed Care, Other (non HMO)

## 2017-02-16 ENCOUNTER — Emergency Department
Admission: EM | Admit: 2017-02-16 | Discharge: 2017-02-16 | Disposition: A | Discharge status: Home / Self Care | Payer: Managed Care, Other (non HMO) | Attending: Emergency Medicine | Admitting: Emergency Medicine

## 2017-02-16 ENCOUNTER — Telehealth: Payer: Self-pay | Admitting: Certified Nurse Midwife

## 2017-02-16 DIAGNOSIS — Z79899 Other long term (current) drug therapy: Secondary | ICD-10-CM | POA: Insufficient documentation

## 2017-02-16 DIAGNOSIS — R079 Chest pain, unspecified: Secondary | ICD-10-CM | POA: Insufficient documentation

## 2017-02-16 DIAGNOSIS — R002 Palpitations: Secondary | ICD-10-CM | POA: Insufficient documentation

## 2017-02-16 LAB — BASIC METABOLIC PANEL
ANION GAP: 9 (ref 5–15)
BUN: 7 mg/dL (ref 6–20)
CALCIUM: 9 mg/dL (ref 8.9–10.3)
CHLORIDE: 107 mmol/L (ref 101–111)
CO2: 22 mmol/L (ref 22–32)
Creatinine, Ser: 0.53 mg/dL (ref 0.44–1.00)
GFR calc Af Amer: >60 mL/min (ref >60)
GFR calc non Af Amer: >60 mL/min (ref >60)
GLUCOSE: 115 mg/dL — AB (ref 65–99)
POTASSIUM: 3.6 mmol/L (ref 3.5–5.1)
Sodium: 138 mmol/L (ref 135–145)

## 2017-02-16 LAB — PROTEIN / CREATININE RATIO, URINE: CREATININE, URINE: 29 mg/dL

## 2017-02-16 LAB — HEPATIC FUNCTION PANEL
ALT: 28 U/L (ref 14–54)
AST: 24 U/L (ref 15–41)
Albumin: 3.1 g/dL — ABNORMAL LOW (ref 3.5–5.0)
Alkaline Phosphatase: 44 U/L (ref 38–126)
Total Bilirubin: 0.3 mg/dL (ref 0.3–1.2)
Total Protein: 6.4 g/dL — ABNORMAL LOW (ref 6.5–8.1)

## 2017-02-16 LAB — CBC
HEMATOCRIT: 36.5 % (ref 35.0–47.0)
HEMOGLOBIN: 12.3 g/dL (ref 12.0–16.0)
MCH: 28.2 pg (ref 26.0–34.0)
MCHC: 33.7 g/dL (ref 32.0–36.0)
MCV: 83.6 fL (ref 80.0–100.0)
Platelets: 185 10*3/uL (ref 150–440)
RBC: 4.36 MIL/uL (ref 3.80–5.20)
RDW: 14 % (ref 11.5–14.5)
WBC: 9.3 10*3/uL (ref 3.6–11.0)

## 2017-02-16 LAB — LIPASE, BLOOD: Lipase: 33 U/L (ref 11–51)

## 2017-02-16 LAB — TSH: TSH: 1.918 u[IU]/mL (ref 0.350–4.500)

## 2017-02-16 LAB — TROPONIN I: Troponin I: <0.03 ng/mL (ref <0.03)

## 2017-02-16 NOTE — Discharge Instructions (Signed)

## 2017-02-16 NOTE — ED Triage Notes (Signed)
Pt c/o dull chest pain started earlier today radiating into her back along with being lightheaded. Pt currently [redacted] weeks pregnant.

## 2017-02-16 NOTE — Telephone Encounter (Signed)
Patient called stated that she is experiencing dizziness, feeling her heart race and bp of 145/93 and a  Pulse of 125. The patient stated that she is not feeling like her self and would like to speak to a nurse to make sure she doesn't need to be seen sooner. The patient did not disclose any other information. Please advise.

## 2017-02-16 NOTE — ED Notes (Signed)
Patient transported to Ultrasound 

## 2017-02-16 NOTE — ED Provider Notes (Addendum)
[facility] Emergency Department Provider Note  ____________________________________________  Time seen: Approximately 2:12 PM  I have reviewed the triage vital signs and the nursing notes.   HISTORY  Chief Complaint Chest Pain   HPI Kathryn Lozano is a 31 y.o. female G1P0 at [redacted] weeks GA, ADHD, anxiety who presents for evaluation of palpitations. Patient reports that she was at work today when she started to have palpitations. She denies ever having similar symptoms. Her symptoms were constant for several hours which prompted her to call her OB/GYN. When she never heard from them she went to their clinic and he was sent here for evaluation. Patient does not drink coffee or power drinks. No personal or family history of PE or DVT. No h/o thyroid disease. She denies any palpitations at this time. She does endorse mild chest discomfort that started after 1 hour of palpitations located in the center of her chest, dull in quality which has now resolved. No CP at this time. No SOB, no cough, fever, nausea, vomiting, diarrhea, abdominal pain. No vaginal bleeding or vaginal discharge. Patient does endorse bilateral lower extremity swelling which has been an ongoing issue during her pregnancy.  Past Medical History:  Diagnosis Date  . ADHD (attention deficit hyperactivity disorder)   . Anxiety   . Heartburn   . Migraines   . PONV (postoperative nausea and vomiting)    Pt reports nausea, "sensitive stomach"  . Renal disorder    KIDNEY STONES    Patient Active Problem List   Diagnosis Date Noted  . Morbid obesity ([place]) 12/04/2016  . Rubella non-immune status, antepartum 11/05/2016  . Chronic cough 12/28/2012  . Post-nasal drip 12/28/2012  . Anxiety   . Migraines   . ADHD (attention deficit hyperactivity disorder)     Past Surgical History:  Procedure Laterality Date  . CHOLECYSTECTOMY N/A 09/14/2014   Procedure: LAPAROSCOPIC CHOLECYSTECTOMY;  Surgeon:  Coralie Keens, MD;  Location: [place];  Service: General;  Laterality: N/A;  . CYSTOSCOPY W/ URETERAL STENT PLACEMENT Left 01/30/2016   Procedure: CYSTOSCOPY WITH STENT REPLACEMENT;  Surgeon: Hollice Espy, MD;  Location: [facility];  Service: Urology;  Laterality: Left;  . CYSTOSCOPY WITH STENT PLACEMENT  01/24/2016   Procedure: CYSTOSCOPY WITH STENT PLACEMENT;  Surgeon: Hollice Espy, MD;  Location: [facility];  Service: Urology;;  . LITHOTRIPSY  2005  . URETEROSCOPY  01/24/2016   Procedure: URETEROSCOPY;  Surgeon: Hollice Espy, MD;  Location: [facility];  Service: Urology;;  . URETEROSCOPY Left 01/30/2016   Procedure: URETEROSCOPY;  Surgeon: Hollice Espy, MD;  Location: [facility];  Service: Urology;  Laterality: Left;  . WISDOM TOOTH EXTRACTION      Prior to Admission medications   Medication Sig Start Date End Date Taking? Authorizing Provider  montelukast (SINGULAIR) 10 MG tablet TAKE 1 TABLET BY MOUTH EVERY DAY AS DIRECTED 03/11/16   Susy Frizzle, MD  Prenatal Vit-Fe Fumarate-FA (PRENATAL MULTIVITAMIN) TABS tablet Take 1 tablet by mouth daily at 12 noon.    [provider]  ranitidine (ZANTAC) 300 MG tablet Take 1 tablet (300 mg total) by mouth 2 (two) times daily. 12/04/16   Joylene Igo, CNM    Allergies Roxicet [oxycodone-acetaminophen]  Family History  Problem Relation Age of Onset  . Alzheimer's disease Paternal Uncle   . Thyroid cancer Paternal Uncle   . Alzheimer's disease Paternal Grandmother   . Kidney cancer Neg Hx   . Bladder Cancer Neg Hx   . Breast cancer  Neg Hx   . Rashes / Skin problems Neg Hx   . Colon cancer Neg Hx   . Diabetes Neg Hx     Social History Social History  Substance Use Topics  . Smoking status: Never Smoker  . Smokeless tobacco: Never Used  . Alcohol use No     Comment: Once every 6 months.     Review of Systems  Constitutional: Negative for fever. Eyes: Negative for visual changes. ENT: Negative for sore  throat. Neck: No neck pain  Cardiovascular: + chest pain and palpitations Respiratory: Negative for shortness of breath. Gastrointestinal: Negative for abdominal pain, vomiting or diarrhea. Genitourinary: Negative for dysuria. Musculoskeletal: Negative for back pain. + b/l leg swelling Skin: Negative for rash. Neurological: Negative for headaches, weakness or numbness. Psych: No SI or HI  ____________________________________________   PHYSICAL EXAM:  VITAL SIGNS: ED Triage Vitals [02/16/17 1020]  Enc Vitals Group     BP 128/87     Pulse Rate (!) 107     Resp 20     Temp 98.6 F (37 C)     Temp Source Oral     SpO2 98 %     Weight      Height      Head Circumference      Peak Flow      Pain Score 4     Pain Loc      Pain Edu?      Excl. in [place]?     Constitutional: Alert and oriented. Well appearing and in no apparent distress. HEENT:      Head: Normocephalic and atraumatic.         Eyes: Conjunctivae are normal. Sclera is non-icteric.       Mouth/Throat: Mucous membranes are moist.       Neck: Supple with no signs of meningismus. Cardiovascular: Tachycardic with regular rhythm. No murmurs, gallops, or rubs. 2+ symmetrical distal pulses are present in all extremities. No JVD. Respiratory: Normal respiratory effort. Lungs are clear to auscultation bilaterally. No wheezes, crackles, or rhonchi.  Gastrointestinal: Gravid, non tender, and non distended with positive bowel sounds. No rebound or guarding. Musculoskeletal: Trace b/l LE edema Neurologic: Normal speech and language. Face is symmetric. Moving all extremities. No gross focal neurologic deficits are appreciated. Skin: Skin is warm, dry and intact. No rash noted. Psychiatric: Mood and affect are normal. Speech and behavior are normal.  ____________________________________________   LABS (all labs ordered are listed, but only abnormal results are displayed)  Labs Reviewed  BASIC METABOLIC PANEL - Abnormal;  Notable for the following:       Result Value   Glucose, Bld 115 (*)    All other components within normal limits  HEPATIC FUNCTION PANEL - Abnormal; Notable for the following:    Total Protein 6.4 (*)    Albumin 3.1 (*)    Bilirubin, Direct <0.1 (*)    All other components within normal limits  CBC  TROPONIN I  LIPASE, BLOOD  TSH  PROTEIN / CREATININE RATIO, URINE  TROPONIN I   ____________________________________________  EKG  ED ECG REPORT I, Rudene Re, the attending physician, personally viewed and interpreted this ECG.  10:12 - sinus tachycardia, rate of 106, normal intervals, normal axis, no ST elevations or depressions, T-wave flattening in inferior leads and T-wave inversion in V2. No significant changes when compared to prior from May 2018  11:32 - normal sinus rhythm, rate of 99, normal intervals, normal axis, no ST elevations or  depressions, persistent T-wave flattening in inferior leads and inversion in V2. Unchanged from prior.  14:35 - Normal sinus rhythm, rate of 95, normal intervals, normal axis, no ST elevations or depressions, unchanged from initial ones. ____________________________________________  RADIOLOGY  CXR: negative   Doppler US: negative ____________________________________________   PROCEDURES  Procedure(s) performed: yes Procedures   Bedside ultrasound showing single IUP with good fetal movement and fetal heart rate of 143  Critical Care performed:  None ____________________________________________   INITIAL IMPRESSION / ASSESSMENT AND PLAN / ED COURSE   31 y.o. female G1P0 at [redacted] weeks GA, ADHD, anxiety who presents for evaluation of palpitations and CP. Patient had an episode of palpitations in the waiting room with a repeat EKG showing normal sinus rhythm with no evidence of arrhythmias. Patient is asymptomatic at this time. Fetal heart tones of 160. Vital signs within normal limits. CBC, CMP, troponin, TSH and CXR with no  acute findings. Doppler of b/l LE done to eval for DVT as patient is complaining of b/l leg swelling. Patient does have history of anxiety which could be predisposing her to experiencing those symptoms. Patient being monitored on telemetry with no arrhythmia. Plan to f/u results of doppler and discuss with Obgyn. BP slightly elevated in the ED however normal platelets, LFTs, and UPC with no evidence of pre-eclampsia/ HELP syndrome.     ---------------------------------------- 12:12 PM on 02/16/2017 -----------------------------------------  OBSERVATION CARE: This patient is being placed under observation care for the following reasons: Chest pain with repeat testing to rule out ischemia  _________________________ 2:42 PM on 02/16/2017 -----------------------------------------  Discussed with Dr. Enzo Bi, OBGYN on-call who recommended follow-up with him in a week. Since patient is not having contractions he does not think patient needs to be monitored in L&D at this time. We'll get a second set of cardiac enzymes since patient had chest pain earlier today and if that's negative plan to discharge home. Patient was calling nurse in the room because she had another episode of palpitations in the setting of talking to her husband on the phone. Patient said she thought her husband said that her father in law had passed away but she misunderstood him. Patient teary and very anxious in the room. Patient provided reassurance.  ----------------------------------------- 3:20 PM on 02/16/2017 -----------------------------------------   END OF OBSERVATION STATUS: After an appropriate period of observation, this patient is being discharged due to the following reason(s):  Troponin x 2 negative, patient remains stable with no CP. Plan to dc with close f/u with ob and PCP  Pertinent labs & imaging results that were available during my care of the patient were reviewed by me and considered in my medical  decision making (see chart for details).    ____________________________________________   FINAL CLINICAL IMPRESSION(S) / ED DIAGNOSES  Final diagnoses:  Palpitations  Chest pain, unspecified type      NEW MEDICATIONS STARTED DURING THIS VISIT:  New Prescriptions   No medications on file     Note:  This document was prepared using Dragon voice recognition software and may include unintentional dictation errors.    Alfred Levins, [place], MD 02/16/17 [place], [place], MD 02/16/17 [phone]

## 2017-02-17 NOTE — Telephone Encounter (Signed)
Pt went to ER 02/17/17

## 2017-02-20 ENCOUNTER — Ambulatory Visit (INDEPENDENT_AMBULATORY_CARE_PROVIDER_SITE_OTHER): Payer: Managed Care, Other (non HMO)

## 2017-02-20 DIAGNOSIS — Z048 Encounter for examination and observation for other specified reasons: Secondary | ICD-10-CM

## 2017-02-20 DIAGNOSIS — IMO0002 Reserved for concepts with insufficient information to code with codable children: Secondary | ICD-10-CM

## 2017-02-20 DIAGNOSIS — Z0489 Encounter for examination and observation for other specified reasons: Secondary | ICD-10-CM

## 2017-03-10 ENCOUNTER — Encounter: Payer: Managed Care, Other (non HMO) | Admitting: Obstetrics and Gynecology

## 2017-03-10 ENCOUNTER — Ambulatory Visit (INDEPENDENT_AMBULATORY_CARE_PROVIDER_SITE_OTHER): Payer: Managed Care, Other (non HMO) | Admitting: Certified Nurse Midwife

## 2017-03-10 ENCOUNTER — Encounter: Payer: Self-pay | Admitting: Certified Nurse Midwife

## 2017-03-10 ENCOUNTER — Other Ambulatory Visit: Payer: Managed Care, Other (non HMO)

## 2017-03-10 VITALS — BP 101/77 | HR 103 | Wt 239.5 lb

## 2017-03-10 DIAGNOSIS — Z23 Encounter for immunization: Secondary | ICD-10-CM | POA: Diagnosis not present

## 2017-03-10 DIAGNOSIS — Z3482 Encounter for supervision of other normal pregnancy, second trimester: Secondary | ICD-10-CM | POA: Diagnosis not present

## 2017-03-10 DIAGNOSIS — Z131 Encounter for screening for diabetes mellitus: Secondary | ICD-10-CM

## 2017-03-10 DIAGNOSIS — Z130 Encounter for screening for diseases of the blood and blood-forming organs and certain disorders involving the immune mechanism: Secondary | ICD-10-CM

## 2017-03-10 LAB — POCT URINALYSIS DIPSTICK
BILIRUBIN UA: NEGATIVE
Blood, UA: NEGATIVE
Glucose, UA: NEGATIVE
KETONES UA: NEGATIVE
LEUKOCYTES UA: NEGATIVE
Nitrite, UA: NEGATIVE
Protein, UA: NEGATIVE
Spec Grav, UA: 1.02 (ref 1.010–1.025)
Urobilinogen, UA: 0.2 E.U./dL
pH, UA: 7 (ref 5.0–8.0)

## 2017-03-10 NOTE — Progress Notes (Signed)
ROB, doing well. Having 1 hr GTT, Tdap, CBC and BTC today. Pt scheduled the  Gtt appointment early. There was confusion with scheduling. States that it was an error from front.  Discussed her weight gain, encouraged her to add daily walks, and to watch her diet. She agrees to plan. She will follow up in 3 wks.    Philip Aspen, CNM

## 2017-03-10 NOTE — Patient Instructions (Signed)
Tdap Vaccine (Tetanus, Diphtheria and Pertussis): What You Need to Know 1. Why get vaccinated? Tetanus, diphtheria and pertussis are very serious diseases. Tdap vaccine can protect us from these diseases. And, Tdap vaccine given to pregnant women can protect newborn babies against pertussis. TETANUS (Lockjaw) is rare in the [place] today. It causes painful muscle tightening and stiffness, usually all over the body.  It can lead to tightening of muscles in the head and neck so you can't open your mouth, swallow, or sometimes even breathe. Tetanus kills about 1 out of 10 people who are infected even after receiving the best medical care.  DIPHTHERIA is also rare in the [place] today. It can cause a thick coating to form in the back of the throat.  It can lead to breathing problems, heart failure, paralysis, and death.  PERTUSSIS (Whooping Cough) causes severe coughing spells, which can cause difficulty breathing, vomiting and disturbed sleep.  It can also lead to weight loss, incontinence, and rib fractures. Up to 2 in 100 adolescents and 5 in 100 adults with pertussis are hospitalized or have complications, which could include pneumonia or death.  These diseases are caused by bacteria. Diphtheria and pertussis are spread from person to person through secretions from coughing or sneezing. Tetanus enters the body through cuts, scratches, or wounds. Before vaccines, as many as 200,000 cases of diphtheria, 200,000 cases of pertussis, and hundreds of cases of tetanus, were reported in the [place] each year. Since vaccination began, reports of cases for tetanus and diphtheria have dropped by about 99% and for pertussis by about 80%. 2. Tdap vaccine Tdap vaccine can protect adolescents and adults from tetanus, diphtheria, and pertussis. One dose of Tdap is routinely given at age 11 or 12. People who did not get Tdap at that age should get it as soon as possible. Tdap is especially  important for healthcare professionals and anyone having close contact with a baby younger than 12 months. Pregnant women should get a dose of Tdap during every pregnancy, to protect the newborn from pertussis. Infants are most at risk for severe, life-threatening complications from pertussis. Another vaccine, called Td, protects against tetanus and diphtheria, but not pertussis. A Td booster should be given every 10 years. Tdap may be given as one of these boosters if you have never gotten Tdap before. Tdap may also be given after a severe cut or burn to prevent tetanus infection. Your doctor or the person giving you the vaccine can give you more information. Tdap may safely be given at the same time as other vaccines. 3. Some people should not get this vaccine  A person who has ever had a life-threatening allergic reaction after a previous dose of any diphtheria, tetanus or pertussis containing vaccine, OR has a severe allergy to any part of this vaccine, should not get Tdap vaccine. Tell the person giving the vaccine about any severe allergies.  Anyone who had coma or long repeated seizures within 7 days after a childhood dose of DTP or DTaP, or a previous dose of Tdap, should not get Tdap, unless a cause other than the vaccine was found. They can still get Td.  Talk to your doctor if you: ? have seizures or another nervous system problem, ? had severe pain or swelling after any vaccine containing diphtheria, tetanus or pertussis, ? ever had a condition called Guillain-Barr Syndrome (GBS), ? aren't feeling well on the day the shot is scheduled. 4. Risks With any medicine, including   vaccines, there is a chance of side effects. These are usually mild and go away on their own. Serious reactions are also possible but are rare. Most people who get Tdap vaccine do not have any problems with it. Mild problems following Tdap: (Did not interfere with activities)  Pain where the shot was given (about  3 in 4 adolescents or 2 in 3 adults)  Redness or swelling where the shot was given (about 1 person in 5)  Mild fever of at least 100.4F (up to about 1 in 25 adolescents or 1 in 100 adults)  Headache (about 3 or 4 people in 10)  Tiredness (about 1 person in 3 or 4)  Nausea, vomiting, diarrhea, stomach ache (up to 1 in 4 adolescents or 1 in 10 adults)  Chills, sore joints (about 1 person in 10)  Body aches (about 1 person in 3 or 4)  Rash, swollen glands (uncommon)  Moderate problems following Tdap: (Interfered with activities, but did not require medical attention)  Pain where the shot was given (up to 1 in 5 or 6)  Redness or swelling where the shot was given (up to about 1 in 16 adolescents or 1 in 12 adults)  Fever over 102F (about 1 in 100 adolescents or 1 in 250 adults)  Headache (about 1 in 7 adolescents or 1 in 10 adults)  Nausea, vomiting, diarrhea, stomach ache (up to 1 or 3 people in 100)  Swelling of the entire arm where the shot was given (up to about 1 in 500).  Severe problems following Tdap: (Unable to perform usual activities; required medical attention)  Swelling, severe pain, bleeding and redness in the arm where the shot was given (rare).  Problems that could happen after any vaccine:  People sometimes faint after a medical procedure, including vaccination. Sitting or lying down for about 15 minutes can help prevent fainting, and injuries caused by a fall. Tell your doctor if you feel dizzy, or have vision changes or ringing in the ears.  Some people get severe pain in the shoulder and have difficulty moving the arm where a shot was given. This happens very rarely.  Any medication can cause a severe allergic reaction. Such reactions from a vaccine are very rare, estimated at fewer than 1 in a million doses, and would happen within a few minutes to a few hours after the vaccination. As with any medicine, there is a very remote chance of a vaccine  causing a serious injury or death. The safety of vaccines is always being monitored. For more information, visit: www.cdc.gov/vaccinesafety/ 5. What if there is a serious problem? What should I look for? Look for anything that concerns you, such as signs of a severe allergic reaction, very high fever, or unusual behavior. Signs of a severe allergic reaction can include hives, swelling of the face and throat, difficulty breathing, a fast heartbeat, dizziness, and weakness. These would usually start a few minutes to a few hours after the vaccination. What should I do?  If you think it is a severe allergic reaction or other emergency that can't wait, call 9-1-1 or get the person to the nearest hospital. Otherwise, call your doctor.  Afterward, the reaction should be reported to the Vaccine Adverse Event Reporting System (VAERS). Your doctor might file this report, or you can do it yourself through the VAERS web site at www.vaers.hhs.gov, or by calling 1-800-822-7967. ? VAERS does not give medical advice. 6. The National Vaccine Injury Compensation Program The National   Vaccine Injury Compensation Program (VICP) is a federal program that was created to compensate people who may have been injured by certain vaccines. Persons who believe they may have been injured by a vaccine can learn about the program and about filing a claim by calling 1-800-338-2382 or visiting the VICP website at www.hrsa.gov/vaccinecompensation. There is a time limit to file a claim for compensation. 7. How can I learn more?  Ask your doctor. He or she can give you the vaccine package insert or suggest other sources of information.  Call your local or state health department.  Contact the Centers for Disease Control and Prevention (CDC): ? Call 1-800-232-4636 (1-800-CDC-INFO) or ? Visit CDC's website at www.cdc.gov/vaccines CDC Tdap Vaccine VIS (08/30/13) This information is not intended to replace advice given to you by your  health care provider. Make sure you discuss any questions you have with your health care provider. Document Released: 12/23/2011 Document Revised: 03/13/2016 Document Reviewed: 03/13/2016 Elsevier Interactive Patient Education  2017 [employer] Inc.  

## 2017-03-11 ENCOUNTER — Encounter: Payer: Self-pay | Admitting: Certified Nurse Midwife

## 2017-03-11 LAB — CBC WITH DIFFERENTIAL/PLATELET
Basophils Absolute: 0 10*3/uL (ref 0.0–0.2)
Basos: 0 %
EOS (ABSOLUTE): 0.1 10*3/uL (ref 0.0–0.4)
Eos: 1 %
Hematocrit: 36.6 % (ref 34.0–46.6)
Hemoglobin: 12 g/dL (ref 11.1–15.9)
IMMATURE GRANULOCYTES: 1 %
Immature Grans (Abs): 0.1 10*3/uL (ref 0.0–0.1)
Lymphocytes Absolute: 1.3 10*3/uL (ref 0.7–3.1)
Lymphs: 14 %
MCH: 28.2 pg (ref 26.6–33.0)
MCHC: 32.8 g/dL (ref 31.5–35.7)
MCV: 86 fL (ref 79–97)
MONOS ABS: 0.9 10*3/uL (ref 0.1–0.9)
Monocytes: 10 %
NEUTROS PCT: 74 %
Neutrophils Absolute: 6.7 10*3/uL (ref 1.4–7.0)
PLATELETS: 192 10*3/uL (ref 150–379)
RBC: 4.25 x10E6/uL (ref 3.77–5.28)
RDW: 14.2 % (ref 12.3–15.4)
WBC: 9 10*3/uL (ref 3.4–10.8)

## 2017-03-11 LAB — GLUCOSE, 1 HOUR GESTATIONAL: Gestational Diabetes Screen: 88 mg/dL (ref 65–139)

## 2017-03-13 ENCOUNTER — Other Ambulatory Visit: Payer: Self-pay | Admitting: Family Medicine

## 2017-03-25 ENCOUNTER — Encounter: Payer: Self-pay | Admitting: Obstetrics and Gynecology

## 2017-03-31 ENCOUNTER — Ambulatory Visit (INDEPENDENT_AMBULATORY_CARE_PROVIDER_SITE_OTHER): Payer: Managed Care, Other (non HMO) | Admitting: Obstetrics and Gynecology

## 2017-03-31 VITALS — BP 118/78 | HR 100 | Wt 242.5 lb

## 2017-03-31 DIAGNOSIS — Z23 Encounter for immunization: Secondary | ICD-10-CM

## 2017-03-31 DIAGNOSIS — Z3493 Encounter for supervision of normal pregnancy, unspecified, third trimester: Secondary | ICD-10-CM

## 2017-03-31 LAB — POCT URINALYSIS DIPSTICK
Bilirubin, UA: NEGATIVE
Blood, UA: NEGATIVE
Glucose, UA: NEGATIVE
KETONES UA: NEGATIVE
Leukocytes, UA: NEGATIVE
Nitrite, UA: NEGATIVE
PH UA: 7.5 (ref 5.0–8.0)
PROTEIN UA: NEGATIVE
SPEC GRAV UA: 1.015 (ref 1.010–1.025)
UROBILINOGEN UA: 0.2 U/dL

## 2017-03-31 MED ORDER — BREAST PUMP MISC: 0 refills | Status: DC

## 2017-03-31 MED ORDER — PANTOPRAZOLE SODIUM 40 MG PO TBEC: 40.0000 mg | DELAYED_RELEASE_TABLET | Freq: Every day | ORAL | 6 refills | Status: DC

## 2017-03-31 NOTE — Progress Notes (Signed)
ROB- Flu & Tdap given, discussed weight gain and dietary changes- has already decreased carb intake, drinking water except for small amount orange juice in am and glass of chocolate milk on middle of night, walks a lot at work; acid reflux is bad-TUMs and zantac isn't working and forgets night dose a lot. Will switch to protonix

## 2017-03-31 NOTE — Patient Instructions (Signed)
Third Trimester of Pregnancy The third trimester is from week 28 through week 40 (months 7 through 9). The third trimester is a time when the unborn baby (fetus) is growing rapidly. At the end of the ninth month, the fetus is about 20 inches in length and weighs 6-10 pounds. Body changes during your third trimester Your body will continue to go through many changes during pregnancy. The changes vary from woman to woman. During the third trimester:  Your weight will continue to increase. You can expect to gain 25-35 pounds (11-16 kg) by the end of the pregnancy.  You may begin to get stretch marks on your hips, abdomen, and breasts.  You may urinate more often because the fetus is moving lower into your pelvis and pressing on your bladder.  You may develop or continue to have heartburn. This is caused by increased hormones that slow down muscles in the digestive tract.  You may develop or continue to have constipation because increased hormones slow digestion and cause the muscles that push waste through your intestines to relax.  You may develop hemorrhoids. These are swollen veins (varicose veins) in the rectum that can itch or be painful.  You may develop swollen, bulging veins (varicose veins) in your legs.  You may have increased body aches in the pelvis, back, or thighs. This is due to weight gain and increased hormones that are relaxing your joints.  You may have changes in your hair. These can include thickening of your hair, rapid growth, and changes in texture. Some women also have hair loss during or after pregnancy, or hair that feels dry or thin. Your hair will most likely return to normal after your baby is born.  Your breasts will continue to grow and they will continue to become tender. A yellow fluid (colostrum) may leak from your breasts. This is the first milk you are producing for your baby.  Your belly button may stick out.  You may notice more swelling in your hands,  face, or ankles.  You may have increased tingling or numbness in your hands, arms, and legs. The skin on your belly may also feel numb.  You may feel short of breath because of your expanding uterus.  You may have more problems sleeping. This can be caused by the size of your belly, increased need to urinate, and an increase in your body's metabolism.  You may notice the fetus "dropping," or moving lower in your abdomen (lightening).  You may have increased vaginal discharge.  You may notice your joints feel loose and you may have pain around your pelvic bone.  What to expect at prenatal visits You will have prenatal exams every 2 weeks until week 36. Then you will have weekly prenatal exams. During a routine prenatal visit:  You will be weighed to make sure you and the baby are growing normally.  Your blood pressure will be taken.  Your abdomen will be measured to track your baby's growth.  The fetal heartbeat will be listened to.  Any test results from the previous visit will be discussed.  You may have a cervical check near your due date to see if your cervix has softened or thinned (effaced).  You will be tested for Group B streptococcus. This happens between 35 and 37 weeks.  Your health care provider may ask you:  What your birth plan is.  How you are feeling.  If you are feeling the baby move.  If you have had   any abnormal symptoms, such as leaking fluid, bleeding, severe headaches, or abdominal cramping.  If you are using any tobacco products, including cigarettes, chewing tobacco, and electronic cigarettes.  If you have any questions.  Other tests or screenings that may be performed during your third trimester include:  Blood tests that check for low iron levels (anemia).  Fetal testing to check the health, activity level, and growth of the fetus. Testing is done if you have certain medical conditions or if there are problems during the  pregnancy.  Nonstress test (NST). This test checks the health of your baby to make sure there are no signs of problems, such as the baby not getting enough oxygen. During this test, a belt is placed around your belly. The baby is made to move, and its heart rate is monitored during movement.  What is false labor? False labor is a condition in which you feel small, irregular tightenings of the muscles in the womb (contractions) that usually go away with rest, changing position, or drinking water. These are called Braxton Hicks contractions. Contractions may last for hours, days, or even weeks before true labor sets in. If contractions come at regular intervals, become more frequent, increase in intensity, or become painful, you should see your health care provider. What are the signs of labor?  Abdominal cramps.  Regular contractions that start at 10 minutes apart and become stronger and more frequent with time.  Contractions that start on the top of the uterus and spread down to the lower abdomen and back.  Increased pelvic pressure and dull back pain.  A watery or bloody mucus discharge that comes from the vagina.  Leaking of amniotic fluid. This is also known as your "water breaking." It could be a slow trickle or a gush. Let your health care provider know if it has a color or strange odor. If you have any of these signs, call your health care provider right away, even if it is before your due date. Follow these instructions at home: Medicines  Follow your health care provider's instructions regarding medicine use. Specific medicines may be either safe or unsafe to take during pregnancy.  Take a prenatal vitamin that contains at least 600 micrograms (mcg) of folic acid.  If you develop constipation, try taking a stool softener if your health care provider approves. Eating and drinking  Eat a balanced diet that includes fresh fruits and vegetables, whole grains, good sources of protein  such as meat, eggs, or tofu, and low-fat dairy. Your health care provider will help you determine the amount of weight gain that is right for you.  Avoid raw meat and uncooked cheese. These carry germs that can cause birth defects in the baby.  If you have low calcium intake from food, talk to your health care provider about whether you should take a daily calcium supplement.  Eat four or five small meals rather than three large meals a day.  Limit foods that are high in fat and processed sugars, such as fried and sweet foods.  To prevent constipation: ? Drink enough fluid to keep your urine clear or pale yellow. ? Eat foods that are high in fiber, such as fresh fruits and vegetables, whole grains, and beans. Activity  Exercise only as directed by your health care provider. Most women can continue their usual exercise routine during pregnancy. Try to exercise for 30 minutes at least 5 days a week. Stop exercising if you experience uterine contractions.  Avoid heavy   lifting.  Do not exercise in extreme heat or humidity, or at high altitudes.  Wear low-heel, comfortable shoes.  Practice good posture.  You may continue to have sex unless your health care provider tells you otherwise. Relieving pain and discomfort  Take frequent breaks and rest with your legs elevated if you have leg cramps or low back pain.  Take warm sitz baths to soothe any pain or discomfort caused by hemorrhoids. Use hemorrhoid cream if your health care provider approves.  Wear a good support bra to prevent discomfort from breast tenderness.  If you develop varicose veins: ? Wear support pantyhose or compression stockings as told by your healthcare provider. ? Elevate your feet for 15 minutes, 3-4 times a day. Prenatal care  Write down your questions. Take them to your prenatal visits.  Keep all your prenatal visits as told by your health care provider. This is important. Safety  Wear your seat belt at  all times when driving.  Make a list of emergency phone numbers, including numbers for family, friends, the hospital, and police and fire departments. General instructions  Avoid cat litter boxes and soil used by cats. These carry germs that can cause birth defects in the baby. If you have a cat, ask someone to clean the litter box for you.  Do not travel far distances unless it is absolutely necessary and only with the approval of your health care provider.  Do not use hot tubs, steam rooms, or saunas.  Do not drink alcohol.  Do not use any products that contain nicotine or tobacco, such as cigarettes and e-cigarettes. If you need help quitting, ask your health care provider.  Do not use any medicinal herbs or unprescribed drugs. These chemicals affect the formation and growth of the baby.  Do not douche or use tampons or scented sanitary pads.  Do not cross your legs for long periods of time.  To prepare for the arrival of your baby: ? Take prenatal classes to understand, practice, and ask questions about labor and delivery. ? Make a trial run to the hospital. ? Visit the hospital and tour the maternity area. ? Arrange for maternity or paternity leave through employers. ? Arrange for family and friends to take care of pets while you are in the hospital. ? Purchase a rear-facing car seat and make sure you know how to install it in your car. ? Pack your hospital bag. ? Prepare the baby's nursery. Make sure to remove all pillows and stuffed animals from the baby's crib to prevent suffocation.  Visit your dentist if you have not gone during your pregnancy. Use a soft toothbrush to brush your teeth and be gentle when you floss. Contact a health care provider if:  You are unsure if you are in labor or if your water has broken.  You become dizzy.  You have mild pelvic cramps, pelvic pressure, or nagging pain in your abdominal area.  You have lower back pain.  You have persistent  nausea, vomiting, or diarrhea.  You have an unusual or bad smelling vaginal discharge.  You have pain when you urinate. Get help right away if:  Your water breaks before 37 weeks.  You have regular contractions less than 5 minutes apart before 37 weeks.  You have a fever.  You are leaking fluid from your vagina.  You have spotting or bleeding from your vagina.  You have severe abdominal pain or cramping.  You have rapid weight loss or weight gain.    You have shortness of breath with chest pain.  You notice sudden or extreme swelling of your face, hands, ankles, feet, or legs.  Your baby makes fewer than 10 movements in 2 hours.  You have severe headaches that do not go away when you take medicine.  You have vision changes. Summary  The third trimester is from week 28 through week 40, months 7 through 9. The third trimester is a time when the unborn baby (fetus) is growing rapidly.  During the third trimester, your discomfort may increase as you and your baby continue to gain weight. You may have abdominal, leg, and back pain, sleeping problems, and an increased need to urinate.  During the third trimester your breasts will keep growing and they will continue to become tender. A yellow fluid (colostrum) may leak from your breasts. This is the first milk you are producing for your baby.  False labor is a condition in which you feel small, irregular tightenings of the muscles in the womb (contractions) that eventually go away. These are called Braxton Hicks contractions. Contractions may last for hours, days, or even weeks before true labor sets in.  Signs of labor can include: abdominal cramps; regular contractions that start at 10 minutes apart and become stronger and more frequent with time; watery or bloody mucus discharge that comes from the vagina; increased pelvic pressure and dull back pain; and leaking of amniotic fluid. This information is not intended to replace advice  given to you by your health care provider. Make sure you discuss any questions you have with your health care provider. Document Released: 06/17/2001 Document Revised: 11/29/2015 Document Reviewed: 08/24/2012 Elsevier Interactive Patient Education  2017 [employer] Inc.  

## 2017-03-31 NOTE — Progress Notes (Signed)
ROB- pt is doing well, worried about her wt gain, flu vaccine

## 2017-04-01 ENCOUNTER — Encounter: Payer: Managed Care, Other (non HMO) | Admitting: Obstetrics and Gynecology

## 2017-04-11 ENCOUNTER — Observation Stay
Admission: EM | Admit: 2017-04-11 | Discharge: 2017-04-11 | Disposition: A | Discharge status: Home / Self Care | Payer: Managed Care, Other (non HMO) | Attending: Certified Nurse Midwife | Admitting: Certified Nurse Midwife

## 2017-04-11 ENCOUNTER — Observation Stay: Payer: Managed Care, Other (non HMO)

## 2017-04-11 ENCOUNTER — Encounter: Payer: Self-pay | Admitting: *Deleted

## 2017-04-11 DIAGNOSIS — O26892 Other specified pregnancy related conditions, second trimester: Secondary | ICD-10-CM | POA: Diagnosis not present

## 2017-04-11 DIAGNOSIS — Z79899 Other long term (current) drug therapy: Secondary | ICD-10-CM | POA: Diagnosis not present

## 2017-04-11 DIAGNOSIS — Z3A29 29 weeks gestation of pregnancy: Secondary | ICD-10-CM

## 2017-04-11 DIAGNOSIS — N898 Other specified noninflammatory disorders of vagina: Secondary | ICD-10-CM | POA: Insufficient documentation

## 2017-04-11 DIAGNOSIS — O26893 Other specified pregnancy related conditions, third trimester: Secondary | ICD-10-CM

## 2017-04-11 LAB — WET PREP, GENITAL
Clue Cells Wet Prep HPF POC: NONE SEEN
Sperm: NONE SEEN
Trich, Wet Prep: NONE SEEN
WBC WET PREP: NONE SEEN
Yeast Wet Prep HPF POC: NONE SEEN

## 2017-04-11 LAB — URINALYSIS, ROUTINE W REFLEX MICROSCOPIC
Bilirubin Urine: NEGATIVE
GLUCOSE, UA: NEGATIVE mg/dL
HGB URINE DIPSTICK: NEGATIVE
KETONES UR: NEGATIVE mg/dL
Leukocytes, UA: NEGATIVE
Nitrite: NEGATIVE
PH: 7 (ref 5.0–8.0)
PROTEIN: NEGATIVE mg/dL
Specific Gravity, Urine: 1.009 (ref 1.005–1.030)

## 2017-04-11 LAB — ROM PLUS (ARMC ONLY): ROM PLUS: NEGATIVE

## 2017-04-11 NOTE — OB Triage Note (Signed)
   L&D OB Triage Note  SUBJECTIVE Kathryn Lozano is a 31 y.o. G2P0010 female at [redacted]w[redacted]d, EDD Estimated Date of Delivery: 06/23/17 who presented to triage with complaints of vaginal discharge. She states that yesterday at work around 11 am she started experiencing increased vaginal discharge. Her co workers noted that her pants were wet and pointed it out to her. She has continued to have increased discharge throughout today, needing to wear a pad. She also states she had had urinary symptoms. She has urgency to void but when she goes she has small amount.    Obstetric History   G2   P0   T0   P0   A1   L0    SAB1   TAB0   Ectopic0   Multiple0   Live Births0     # Outcome Date GA Lbr Len/2nd Weight Sex Delivery Anes PTL Lv  2 Current           1 SAB 2017 [redacted]w[redacted]d    SAB         Prescriptions Prior to Admission  Medication Sig Dispense Refill Last Dose  . HOMEOPATHIC PRODUCTS PO Take 6 capsules by mouth daily.   04/11/2017 at Unknown time  . montelukast (SINGULAIR) 10 MG tablet TAKE 1 TABLET BY MOUTH ONCE DAILY AS DIRECTED 30 tablet 7 04/11/2017 at Unknown time  . pantoprazole (PROTONIX) 40 MG tablet Take 1 tablet (40 mg total) by mouth daily. 30 tablet 6 04/11/2017 at Unknown time  . Prenatal Vit-Fe Fumarate-FA (PRENATAL MULTIVITAMIN) TABS tablet Take 1 tablet by mouth daily at 12 noon.   04/11/2017 at Unknown time  . Misc. Devices (BREAST PUMP) MISC Dispense one breast pump for patient 1 each 0   . ranitidine (ZANTAC) 300 MG tablet Take 1 tablet (300 mg total) by mouth 2 (two) times daily. (Patient not taking: Reported on 04/11/2017) 180 tablet 3 Completed Course at Unknown time     OBJECTIVE  Nursing Evaluation:   BP 134/69   Pulse (!) 117   Temp 98.1 F (36.7 C) (Oral)   LMP 09/16/2016 (Exact Date)    Findings:  No fluid seen by RN on collection of wet prep and rom plus. ROM plus negative, wet prep negative, UA negative. U/s for AFI was appropriate.   Amniotic Fluid (Subjective):  Within  normal limits. AFI:  20.5 cm  NST was performed and has been reviewed by me.  NST INTERPRETATION: Category I  Mode: External Baseline Rate (A): 145 bpm Variability: Moderate Accelerations: 15 x 15 Decelerations: None     Contraction Frequency (min): 0     ASSESSMENT Impression:  1. Pregnancy:  G2P0010 at [redacted]w[redacted]d , EDD Estimated Date of Delivery: 06/23/17 2.  reactive  PLAN 1. Reassurance given 2. Discharge home with precautions to return to L&D or call the office if:  contractions more than  6 per  1 hour, decreased fetal movement or bleeding from vaginal area  3. Continue routine prenatal care.  Philip Aspen, CNM

## 2017-04-11 NOTE — Discharge Instructions (Signed)

## 2017-04-17 ENCOUNTER — Encounter: Payer: Self-pay | Admitting: Certified Nurse Midwife

## 2017-04-17 ENCOUNTER — Ambulatory Visit (INDEPENDENT_AMBULATORY_CARE_PROVIDER_SITE_OTHER): Payer: Managed Care, Other (non HMO) | Admitting: Certified Nurse Midwife

## 2017-04-17 VITALS — BP 102/70 | HR 109 | Wt 247.4 lb

## 2017-04-17 DIAGNOSIS — Z3493 Encounter for supervision of normal pregnancy, unspecified, third trimester: Secondary | ICD-10-CM

## 2017-04-17 DIAGNOSIS — O26893 Other specified pregnancy related conditions, third trimester: Secondary | ICD-10-CM

## 2017-04-17 DIAGNOSIS — N898 Other specified noninflammatory disorders of vagina: Secondary | ICD-10-CM

## 2017-04-17 LAB — POCT URINALYSIS DIPSTICK
Bilirubin, UA: NEGATIVE
Blood, UA: NEGATIVE
GLUCOSE UA: NEGATIVE
Ketones, UA: NEGATIVE
LEUKOCYTES UA: NEGATIVE
NITRITE UA: NEGATIVE
PROTEIN UA: NEGATIVE
UROBILINOGEN UA: 0.2 U/dL
pH, UA: 8 (ref 5.0–8.0)

## 2017-04-17 NOTE — Progress Notes (Signed)
ROB-Pt doing well, seen in OB triage this weekend report leakage of fluid-everything normal. Reports increased malordous vaginal discharge, NuSwab self collected-will notify pt with results. Discussed treatments for carpal tunnel in pregnancy. Education regarding waterbirth and [facility] requirements including consent, protocols, and supplies. Reviewed red flag symptoms and when to call. RTC x 2 weeks for ROB with Deneise Lever or sooner if needed.

## 2017-04-17 NOTE — Progress Notes (Signed)
ROB- no complaints.

## 2017-04-17 NOTE — Patient Instructions (Addendum)
[facility] Class  December 17, 2016  Wednesday 7:00p - 9:00p  [place], [place]  January 21, 2017  Wednesday [place], [place]    February 25, 2017   Wednesday [place], [place]  March 25, 2017  Wednesday  [place], [place]  April 22, 2017 Wednesday [place], [place]  Interested in a waterbirth?  This informational class will help you discover whether waterbirth is the right fit for you.  Education about waterbirth itself, supplies you would need and how to assemble your support team is what you can expect from this class.  Some obstetrical practices require this class in order to pursue a waterbirth.  (Not all obstetrical practices offer waterbirth check with your healthcare provider)  Register only the expectant mom, but you are encouraged to bring your partner to class!  Fees & Payment No fee  Register Online [email or web address] Search Waterbirth Braxton Hicks Contractions Contractions of the uterus can occur throughout pregnancy, but they are not always a sign that you are in labor. You may have practice contractions called Braxton Hicks contractions. These false labor contractions are sometimes confused with true labor. What are [place] contractions? Braxton Hicks contractions are tightening movements that occur in the muscles of the uterus before labor. Unlike true labor contractions, these contractions do not result in opening (dilation) and thinning of the cervix. Toward the end of pregnancy (32-34 weeks), Braxton Hicks contractions can happen more often and may become stronger. These contractions are sometimes difficult to tell apart from true labor because they can be very uncomfortable. You should not feel embarrassed if you go to the hospital with  false labor. Sometimes, the only way to tell if you are in true labor is for your health care provider to look for changes in the cervix. The health care provider will do a physical exam and may monitor your contractions. If you are not in true labor, the exam should show that your cervix is not dilating and your water has not broken. If there are no prenatal problems or other health problems associated with your pregnancy, it is completely safe for you to be sent home with false labor. You may continue to have Braxton Hicks contractions until you go into true labor. How can I tell the difference between true labor and false labor?  Differences ? False labor ? Contractions last 30-70 seconds.: Contractions are usually shorter and not as strong as true labor contractions. ? Contractions become very regular.: Contractions are usually irregular. ? Discomfort is usually felt in the top of the uterus, and it spreads to the lower abdomen and low back.: Contractions are often felt in the front of the lower abdomen and in the groin. ? Contractions do not go away with walking.: Contractions may go away when you walk around or change positions while lying down. ? Contractions usually become more intense and increase in frequency.: Contractions get weaker and are shorter-lasting as time goes on. ? The cervix dilates and gets thinner.: The cervix usually does not dilate or become thin. Follow these instructions at home:  Take over-the-counter and prescription medicines only as told by your health care provider.  Keep up with your usual exercises and follow other instructions from your health care provider.  Eat and drink lightly if you think you are going into labor.  If [facility] contractions are making you uncomfortable: ? Change your position from lying down or resting to walking, or change from walking to resting. ? Sit and rest in a tub of warm water. ? Drink enough fluid to keep your urine  clear or pale yellow. Dehydration may cause these contractions. ? Do slow and deep breathing several times an hour.  Keep all follow-up prenatal visits as told by your health care provider. This is important. Contact a health care provider if:  You have a fever.  You have continuous pain in your abdomen. Get help right away if:  Your contractions become stronger, more regular, and closer together.  You have fluid leaking or gushing from your vagina.  You pass blood-tinged mucus (bloody show).  You have bleeding from your vagina.  You have low back pain that you never had before.  You feel your baby's head pushing down and causing pelvic pressure.  Your baby is not moving inside you as much as it used to. Summary  Contractions that occur before labor are called Braxton Hicks contractions, false labor, or practice contractions.  Braxton Hicks contractions are usually shorter, weaker, farther apart, and less regular than true labor contractions. True labor contractions usually become progressively stronger and regular and they become more frequent.  Manage discomfort from [facility] contractions by changing position, resting in a warm bath, drinking plenty of water, or practicing deep breathing. This information is not intended to replace advice given to you by your health care provider. Make sure you discuss any questions you have with your health care provider. Document Released: 06/23/2005 Document Revised: 05/12/2016 Document Reviewed: 05/12/2016 Elsevier Interactive Patient Education  2017 [employer].

## 2017-04-21 ENCOUNTER — Ambulatory Visit (INDEPENDENT_AMBULATORY_CARE_PROVIDER_SITE_OTHER): Payer: Managed Care, Other (non HMO) | Admitting: Certified Nurse Midwife

## 2017-04-21 VITALS — BP 117/72 | HR 102 | Wt 251.1 lb

## 2017-04-21 DIAGNOSIS — Z3493 Encounter for supervision of normal pregnancy, unspecified, third trimester: Secondary | ICD-10-CM

## 2017-04-21 DIAGNOSIS — R51 Headache: Secondary | ICD-10-CM | POA: Diagnosis not present

## 2017-04-21 DIAGNOSIS — O26893 Other specified pregnancy related conditions, third trimester: Secondary | ICD-10-CM

## 2017-04-21 DIAGNOSIS — O1203 Gestational edema, third trimester: Secondary | ICD-10-CM

## 2017-04-21 LAB — POCT URINALYSIS DIPSTICK
BILIRUBIN UA: NEGATIVE
GLUCOSE UA: NEGATIVE
KETONES UA: NEGATIVE
Leukocytes, UA: NEGATIVE
Nitrite, UA: NEGATIVE
Protein, UA: NEGATIVE
RBC UA: NEGATIVE
SPEC GRAV UA: 1.01 (ref 1.010–1.025)
Urobilinogen, UA: 0.2 E.U./dL
pH, UA: 6 (ref 5.0–8.0)

## 2017-04-21 MED ORDER — MAGNESIUM OXIDE 400 MG PO CAPS: 1.0000 | ORAL_CAPSULE | Freq: Two times a day (BID) | ORAL | 2 refills | Status: DC

## 2017-04-21 NOTE — Progress Notes (Signed)
OB WORK IN- pt c/o "just not feeling well this am when she woke up" c/o b leg swelling and headache

## 2017-04-21 NOTE — Patient Instructions (Signed)
Eating Plan for Pregnant Women While you are pregnant, your body will require additional nutrition to help support your growing baby. It is recommended that you consume:  150 additional calories each day during your first trimester.  300 additional calories each day during your second trimester.  300 additional calories each day during your third trimester.  Eating a healthy, well-balanced diet is very important for your health and for your baby's health. You also have a higher need for some vitamins and minerals, such as folic acid, calcium, iron, and vitamin D. What do I need to know about eating during pregnancy?  Do not try to lose weight or go on a diet during pregnancy.  Choose healthy, nutritious foods. Choose  of a sandwich with a glass of milk instead of a candy bar or a high-calorie sugar-sweetened beverage.  Limit your overall intake of foods that have "empty calories." These are foods that have little nutritional value, such as sweets, desserts, candies, sugar-sweetened beverages, and fried foods.  Eat a variety of foods, especially fruits and vegetables.  Take a prenatal vitamin to help meet the additional needs during pregnancy, specifically for folic acid, iron, calcium, and vitamin D.  Remember to stay active. Ask your health care provider for exercise recommendations that are specific to you.  Practice good food safety and cleanliness, such as washing your hands before you eat and after you prepare raw meat. This helps to prevent foodborne illnesses, such as listeriosis, that can be very dangerous for your baby. Ask your health care provider for more information about listeriosis. What does 150 extra calories look like? Healthy options for an additional 150 calories each day could be any of the following:  Plain low-fat yogurt (6-8 oz) with  cup of berries.  1 apple with 2 teaspoons of peanut butter.  Cut-up vegetables with  cup of hummus.  Low-fat chocolate milk  (8 oz or 1 cup).  1 string cheese with 1 medium orange.   of a peanut butter and jelly sandwich on whole-wheat bread (1 tsp of peanut butter).  For 300 calories, you could eat two of those healthy options each day. What is a healthy amount of weight to gain? The recommended amount of weight for you to gain is based on your pre-pregnancy BMI. If your pre-pregnancy BMI was:  Less than 18 (underweight), you should gain 28-40 lb.  18-24.9 (normal), you should gain 25-35 lb.  25-29.9 (overweight), you should gain 15-25 lb.  Greater than 30 (obese), you should gain 11-20 lb.  What if I am having twins or multiples? Generally, pregnant women who will be having twins or multiples may need to increase their daily calories by 300-600 calories each day. The recommended range for total weight gain is 25-54 lb, depending on your pre-pregnancy BMI. Talk with your health care provider for specific guidance about additional nutritional needs, weight gain, and exercise during your pregnancy. What foods can I eat? Grains Any grains. Try to choose whole grains, such as whole-wheat bread, oatmeal, or brown rice. Vegetables Any vegetables. Try to eat a variety of colors and types of vegetables to get a full range of vitamins and minerals. Remember to wash your vegetables well before eating. Fruits Any fruits. Try to eat a variety of colors and types of fruit to get a full range of vitamins and minerals. Remember to wash your fruits well before eating. Meats and Other Protein Sources Lean meats, including chicken, [place], fish, and lean cuts of beef, veal,  or pork. Make sure that all meats are cooked to "well done." Tofu. Tempeh. Beans. Eggs. Peanut butter and other nut butters. Seafood, such as shrimp, crab, and lobster. If you choose fish, select types that are higher in omega-3 fatty acids, including salmon, herring, mussels, trout, sardines, and pollock. Make sure that all meats are cooked to  food-safe temperatures. Dairy Pasteurized milk and milk alternatives. Pasteurized yogurt and pasteurized cheese. Cottage cheese. Sour cream. Beverages Water. Juices that contain 100% fruit juice or vegetable juice. Caffeine-free teas and decaffeinated coffee. Drinks that contain caffeine are okay to drink, but it is better to avoid caffeine. Keep your total caffeine intake to less than 200 mg each day (12 oz of coffee, tea, or soda) or as directed by your health care provider. Condiments Any pasteurized condiments. Sweets and Desserts Any sweets and desserts. Fats and Oils Any fats and oils. The items listed above may not be a complete list of recommended foods or beverages. Contact your dietitian for more options. What foods are not recommended? Vegetables Unpasteurized (raw) vegetable juices. Fruits Unpasteurized (raw) fruit juices. Meats and Other Protein Sources Cured meats that have nitrates, such as bacon, salami, and hotdogs. Luncheon meats, bologna, or other deli meats (unless they are reheated until they are steaming hot). Refrigerated pate, meat spreads from a meat counter, smoked seafood that is found in the refrigerated section of a store. Raw fish, such as sushi or sashimi. High mercury content fish, such as tilefish, shark, swordfish, and king mackerel. Raw meats, such as tuna or beef tartare. Undercooked meats and poultry. Make sure that all meats are cooked to food-safe temperatures. Dairy Unpasteurized (raw) milk and any foods that have raw milk in them. Soft cheeses, such as feta, queso blanco, queso fresco, Brie, Camembert cheeses, blue-veined cheeses, and Panela cheese (unless it is made with pasteurized milk, which must be stated on the label). Beverages Alcohol. Sugar-sweetened beverages, such as sodas, teas, or energy drinks. Condiments Homemade fermented foods and drinks, such as pickles, sauerkraut, or kombucha drinks. (Store-bought pasteurized versions of these are  okay.) Other Salads that are made in the store, such as ham salad, chicken salad, egg salad, tuna salad, and seafood salad. The items listed above may not be a complete list of foods and beverages to avoid. Contact your dietitian for more information. This information is not intended to replace advice given to you by your health care provider. Make sure you discuss any questions you have with your health care provider. Document Released: 04/07/2014 Document Revised: 11/29/2015 Document Reviewed: 12/06/2013 Elsevier Interactive Patient Education  2018 Elsevier Inc.   

## 2017-04-21 NOTE — Progress Notes (Signed)
Subjective:   Kathryn Lozano is a 31 y.o. G2P0010 [redacted]w[redacted]d being seen today for problem obstetrical visit.  Patient reports headache and lower leg swelling while working today.  Brief period of "dark spots" in front of eyes that has since resolved. No relief with 1000 mg tylenol taken at 1100. Not currently wearing abdominal support or compression stockings.   Denies contractions, vaginal bleeding or leaking of fluid.  Reports good fetal movement.  Denies difficulty breathing or respiratory distress, chest pain, abdominal pain, dysuria, and leg pain.   The following portions of the patient's history were reviewed and updated as appropriate: allergies, current medications, past family history, past medical history, past social history, past surgical history and problem list.   Objective:   BP 117/72   Pulse (!) 102   Wt 251 lb 1.6 oz (113.9 kg)   LMP 09/16/2016 (Exact Date)   BMI 44.48 kg/m   CONSTITUTIONAL: Well-developed, well-nourished female in no acute distress.   HENT:  Normocephalic, atraumatic.   NECK: Normal range of motion, supple, no masses.  Normal thyroid.   SKIN: Skin is warm and dry. No rash noted. Not diaphoretic. No erythema. No pallor.  [place]: Alert and oriented to person, place, and time.   PSYCHIATRIC: Normal mood and affect. Normal behavior. Normal judgment and thought content.  CARDIOVASCULAR: Normal rate, regular rhythm, normal S1, S2, no murmurs, rubs, clicks or gallops.  RESPIRATORY: Normal respiratory effort, chest expands symmetrically. Lungs are clear to auscultation, no crackles or wheezes.  BREASTS: Not Examined  ABDOMEN: Soft, non distended; Non tender, obese. FHT present.   NST performed today was reviewed and was found to be reactive. Baseline 120-130 bpm with moderate variability, no decelerations noted and accelerations present.   PELVIC: Not Examined.   MUSCULOSKELETAL: Normal range of motion. No tenderness.  No cyanosis or clubing.  Generalized, non-pitting bilateral lower extremity edema present.   Results for orders placed or performed in visit on 04/21/17 (from the past 24 hour(s))  POCT urinalysis dipstick     Status: None   Collection Time: 04/21/17  1:55 PM  Result Value Ref Range   Color, UA pale yellow    Clarity, UA clear    Glucose, UA neg    Bilirubin, UA neg    Ketones, UA neg    Spec Grav, UA 1.010 1.010 - 1.025   Blood, UA neg    pH, UA 6.0 5.0 - 8.0   Protein, UA neg    Urobilinogen, UA 0.2 0.2 or 1.0 E.U./dL   Nitrite, UA neg    Leukocytes, UA Negative Negative    Assessment and Plan:   Pregnancy:  G2P0010 at [redacted]w[redacted]d  1. Third trimester pregnancy  - POCT urinalysis dipstick - Amb ref to Medical Nutrition Therapy-MNT - CBC - Comprehensive metabolic panel  2. Morbid obesity ([place])  - Amb ref to Medical Nutrition Therapy-MNT - CBC - Comprehensive metabolic panel  3. Swelling of lower extremity during pregnancy in third trimester  - CBC - Comprehensive metabolic panel  4. Pregnancy headache in third trimester    Plan:   Labs: CBC, CMP, see orders. Will contact patient via MyChart with results.   Rx: Magnesium oxide, see orders.   Discussed treatment of congestion with OTC medications.   Serious conversation about weight gain in pregnancy, possible transfer to MD service and need for immediate lifestyle modifications. Referral placed for medical nutrition therapy.   Preterm labor symptoms: vaginal bleeding, contractions and leaking of fluid reviewed in detail.  Fetal movement precautions reviewed.  Follow up as previously scheduled or sooner if needed.    Diona Fanti, CNM

## 2017-04-22 LAB — COMPREHENSIVE METABOLIC PANEL
A/G RATIO: 1.3 (ref 1.2–2.2)
ALT: 17 IU/L (ref 0–32)
AST: 20 IU/L (ref 0–40)
Albumin: 3.5 g/dL (ref 3.5–5.5)
Alkaline Phosphatase: 83 IU/L (ref 39–117)
BUN/Creatinine Ratio: 12 (ref 9–23)
BUN: 6 mg/dL (ref 6–20)
Bilirubin Total: <0.2 mg/dL (ref 0.0–1.2)
CALCIUM: 9.1 mg/dL (ref 8.7–10.2)
CO2: 17 mmol/L — AB (ref 20–29)
Chloride: 100 mmol/L (ref 96–106)
Creatinine, Ser: 0.52 mg/dL — ABNORMAL LOW (ref 0.57–1.00)
GFR, EST AFRICAN AMERICAN: 147 mL/min/{1.73_m2} (ref >59)
GFR, EST NON AFRICAN AMERICAN: 128 mL/min/{1.73_m2} (ref >59)
GLOBULIN, TOTAL: 2.8 g/dL (ref 1.5–4.5)
Glucose: 81 mg/dL (ref 65–99)
POTASSIUM: 4.7 mmol/L (ref 3.5–5.2)
SODIUM: 134 mmol/L (ref 134–144)
TOTAL PROTEIN: 6.3 g/dL (ref 6.0–8.5)

## 2017-04-22 LAB — NUSWAB VAGINITIS PLUS (VG+)
CANDIDA ALBICANS, NAA: NEGATIVE
CANDIDA GLABRATA, NAA: NEGATIVE
Chlamydia trachomatis, NAA: NEGATIVE
NEISSERIA GONORRHOEAE, NAA: NEGATIVE
Trich vag by NAA: NEGATIVE

## 2017-04-22 LAB — CBC
HEMATOCRIT: 36.9 % (ref 34.0–46.6)
HEMOGLOBIN: 12.1 g/dL (ref 11.1–15.9)
MCH: 28 pg (ref 26.6–33.0)
MCHC: 32.8 g/dL (ref 31.5–35.7)
MCV: 85 fL (ref 79–97)
Platelets: 175 10*3/uL (ref 150–379)
RBC: 4.32 x10E6/uL (ref 3.77–5.28)
RDW: 14.8 % (ref 12.3–15.4)
WBC: 8.7 10*3/uL (ref 3.4–10.8)

## 2017-05-01 ENCOUNTER — Encounter: Payer: Managed Care, Other (non HMO) | Admitting: Obstetrics and Gynecology

## 2017-05-04 ENCOUNTER — Ambulatory Visit: Payer: Managed Care, Other (non HMO) | Admitting: Certified Nurse Midwife

## 2017-05-04 ENCOUNTER — Ambulatory Visit (INDEPENDENT_AMBULATORY_CARE_PROVIDER_SITE_OTHER): Payer: Managed Care, Other (non HMO) | Admitting: Certified Nurse Midwife

## 2017-05-04 ENCOUNTER — Other Ambulatory Visit: Payer: Self-pay | Admitting: Certified Nurse Midwife

## 2017-05-04 ENCOUNTER — Ambulatory Visit (INDEPENDENT_AMBULATORY_CARE_PROVIDER_SITE_OTHER): Payer: Managed Care, Other (non HMO)

## 2017-05-04 VITALS — BP 134/84 | HR 114 | Wt 249.1 lb

## 2017-05-04 DIAGNOSIS — Z3493 Encounter for supervision of normal pregnancy, unspecified, third trimester: Secondary | ICD-10-CM | POA: Diagnosis not present

## 2017-05-04 DIAGNOSIS — Z3400 Encounter for supervision of normal first pregnancy, unspecified trimester: Secondary | ICD-10-CM

## 2017-05-04 DIAGNOSIS — O4700 False labor before 37 completed weeks of gestation, unspecified trimester: Secondary | ICD-10-CM

## 2017-05-04 DIAGNOSIS — O479 False labor, unspecified: Secondary | ICD-10-CM | POA: Diagnosis not present

## 2017-05-04 LAB — POCT URINALYSIS DIPSTICK
Bilirubin, UA: NEGATIVE
GLUCOSE UA: NEGATIVE
Ketones, UA: NEGATIVE
Leukocytes, UA: NEGATIVE
NITRITE UA: NEGATIVE
PROTEIN UA: NEGATIVE
RBC UA: NEGATIVE
SPEC GRAV UA: 1.01 (ref 1.010–1.025)
UROBILINOGEN UA: 0.2 U/dL
pH, UA: 6.5 (ref 5.0–8.0)

## 2017-05-04 NOTE — Patient Instructions (Signed)

## 2017-05-04 NOTE — Progress Notes (Signed)
Subjective:   Kathryn Lozano is a 31 y.o. G2P0010 [redacted]w[redacted]d being seen today for problem obstetrical visit.  Patient reports contractions since 0900.  No home treatment measures attempted. Denies  vaginal bleeding or leaking of fluid.  Reports good fetal movement.  The following portions of the patient's history were reviewed and updated as appropriate: allergies, current medications, past family history, past medical history, past social history, past surgical history and problem list.   Objective:  BP 134/84   Pulse (!) 114   Wt 249 lb 1.6 oz (113 kg)   LMP 09/16/2016 (Exact Date)   BMI 44.13 kg/m   FHT: Fetal Heart Rate (bpm): 130  Fetal Movement: Movement: Present  Presentation: Presentation: Vertex    Abdomen:  soft, gravid, appropriate for gestational age,non-tender   Fetal testing  NST performed today was reviewed and was found to be reactive.  Baseline 130 with moderate variability, no decels noted. No contractions noted. AFI normal at 13 cm.    Assessment:   Pregnancy:  G2P0010 at [redacted]w[redacted]d  1. Third trimester pregnancy  - POCT urinalysis dipstick  Plan:   Encouraged home treatment measures including use of abdominal support, compression stockings, hydration, rest, and tylenol.   Advised pt that since symptoms usually occur at work it is important to work only 40 hours a week, avoid heavy lifting, and take breaks every 2 hours. Pt verbalized understanding.   Preterm labor symptoms: vaginal bleeding, contractions and leaking of fluid reviewed in detail.  Fetal movement precautions reviewed.  Follow up as previously scheduled.  Work note given.    Diona Fanti CNM

## 2017-05-04 NOTE — Progress Notes (Signed)
OB WORK IN- c/o contractions, constant pain in lower abd

## 2017-05-07 ENCOUNTER — Ambulatory Visit (INDEPENDENT_AMBULATORY_CARE_PROVIDER_SITE_OTHER): Payer: Managed Care, Other (non HMO) | Admitting: Certified Nurse Midwife

## 2017-05-07 ENCOUNTER — Encounter: Payer: Self-pay | Admitting: Family Medicine

## 2017-05-07 ENCOUNTER — Ambulatory Visit (INDEPENDENT_AMBULATORY_CARE_PROVIDER_SITE_OTHER): Payer: Managed Care, Other (non HMO) | Admitting: Family Medicine

## 2017-05-07 VITALS — BP 111/79 | HR 103 | Wt 248.9 lb

## 2017-05-07 VITALS — BP 126/70 | HR 122 | Temp 97.6°F | Resp 18 | Ht 63.0 in | Wt 250.0 lb

## 2017-05-07 DIAGNOSIS — L600 Ingrowing nail: Secondary | ICD-10-CM

## 2017-05-07 DIAGNOSIS — Z3493 Encounter for supervision of normal pregnancy, unspecified, third trimester: Secondary | ICD-10-CM

## 2017-05-07 LAB — POCT URINALYSIS DIPSTICK
Bilirubin, UA: NEGATIVE
Blood, UA: NEGATIVE
Glucose, UA: NEGATIVE
KETONES UA: NEGATIVE
LEUKOCYTES UA: NEGATIVE
Nitrite, UA: NEGATIVE
PROTEIN UA: NEGATIVE
SPEC GRAV UA: 1.015 (ref 1.010–1.025)
Urobilinogen, UA: 0.2 E.U./dL
pH, UA: 6.5 (ref 5.0–8.0)

## 2017-05-07 NOTE — Progress Notes (Signed)
Subjective:    Patient ID: Kathryn Lozano, female    DOB: 02-15-1986, 31 y.o.   MRN: [id number]  HPI Patient is G2P0010 with EDD 06/23/17 (about 33 weeks) presents with a painful right great toe.  Medial nail fold is ingrown at the distal tip and is extremely painful with the surrounding skin erythematous and extremely sensitive to the touch.  She would like the ingrown portion of the nail removed for comfort.  Fortunately there is no evidence of cellulitis.  No drainage or abscess formation   Past Medical History:  Diagnosis Date  . ADHD (attention deficit hyperactivity disorder)   . Anxiety   . Heartburn   . Migraines   . PONV (postoperative nausea and vomiting)    Pt reports nausea, "sensitive stomach"  . Renal disorder    KIDNEY STONES   Past Surgical History:  Procedure Laterality Date  . CHOLECYSTECTOMY N/A 09/14/2014   Procedure: LAPAROSCOPIC CHOLECYSTECTOMY;  Surgeon: Coralie Keens, MD;  Location: [facility];  Service: General;  Laterality: N/A;  . CYSTOSCOPY W/ URETERAL STENT PLACEMENT Left 01/30/2016   Procedure: CYSTOSCOPY WITH STENT REPLACEMENT;  Surgeon: Hollice Espy, MD;  Location: [facility];  Service: Urology;  Laterality: Left;  . CYSTOSCOPY WITH STENT PLACEMENT  01/24/2016   Procedure: CYSTOSCOPY WITH STENT PLACEMENT;  Surgeon: Hollice Espy, MD;  Location: [facility];  Service: Urology;;  . LITHOTRIPSY  2005  . URETEROSCOPY  01/24/2016   Procedure: URETEROSCOPY;  Surgeon: Hollice Espy, MD;  Location: [facility];  Service: Urology;;  . URETEROSCOPY Left 01/30/2016   Procedure: URETEROSCOPY;  Surgeon: Hollice Espy, MD;  Location: [facility];  Service: Urology;  Laterality: Left;  . WISDOM TOOTH EXTRACTION     Current Outpatient Prescriptions on File Prior to Visit  Medication Sig Dispense Refill  . HOMEOPATHIC PRODUCTS PO Take 6 capsules by mouth daily.    . Magnesium Oxide 400 MG CAPS Take 1 capsule (400 mg total) by mouth 2 (two) times daily. 60 capsule 2  . Misc.  Devices (BREAST PUMP) MISC Dispense one breast pump for patient 1 each 0  . montelukast (SINGULAIR) 10 MG tablet TAKE 1 TABLET BY MOUTH ONCE DAILY AS DIRECTED 30 tablet 7  . pantoprazole (PROTONIX) 40 MG tablet Take 1 tablet (40 mg total) by mouth daily. 30 tablet 6  . Prenatal Vit-Fe Fumarate-FA (PRENATAL MULTIVITAMIN) TABS tablet Take 1 tablet by mouth daily at 12 noon.     No current facility-administered medications on file prior to visit.    Allergies  Allergen Reactions  . Roxicet [Oxycodone-Acetaminophen] Itching    Pt states that she is itching all over her body.    Social History   Social History  . Marital status: Married    Spouse name: N/A  . Number of children: N/A  . Years of education: N/A   Occupational History  . Not on file.   Social History Main Topics  . Smoking status: Never Smoker  . Smokeless tobacco: Never Used  . Alcohol use No     Comment: Once every 6 months.   . Drug use: No  . Sexual activity: Yes    Birth control/ protection: None   Other Topics Concern  . Not on file   Social History Narrative  . No narrative on file      Review of Systems  All other systems reviewed and are negative.      Objective:   Physical Exam  Cardiovascular: Normal rate, regular rhythm and normal  heart sounds.   Pulmonary/Chest: Effort normal and breath sounds normal.  Musculoskeletal:       Feet:  Vitals reviewed.         Assessment & Plan:  Ingrown toenail  Anesthesia was obtained using a digital block with 0.1% lidocaine without epinephrine.  Tourniquet was applied to the base of the toe.  A metal elevator was used to separate the underlying nail matrix/nail bed from the toenail.  A longitudinal cut was then made in the toenail from the tip to the nail matrix.  The ingrown portion of the nail was then removed with hemostats using traction.  The nailbed was then covered with Neosporin, wrapped in petroleum gauze, and wrapped in [place] and.  Wound  care was discussed.

## 2017-05-07 NOTE — Patient Instructions (Addendum)

## 2017-05-10 ENCOUNTER — Encounter: Payer: Self-pay | Admitting: Certified Nurse Midwife

## 2017-05-10 NOTE — Progress Notes (Signed)
BPP: 8/8, completed due to difficulty obtaining NST   Diona Fanti, CNM

## 2017-05-11 ENCOUNTER — Encounter: Payer: Self-pay | Admitting: Certified Nurse Midwife

## 2017-05-11 NOTE — Progress Notes (Signed)
ROB-Pt doing well, wearing abdominal support and compression stocks daily. Reports relief. Discussed upcoming water birth classes at [facility], will send dates via [place]. Reviewed red flag symptoms and when to call. RTC x 2 weeks for 36 weeks cultures and ROB with Deneise Lever or sooner if needed.

## 2017-05-15 ENCOUNTER — Ambulatory Visit (INDEPENDENT_AMBULATORY_CARE_PROVIDER_SITE_OTHER): Payer: Managed Care, Other (non HMO) | Admitting: Certified Nurse Midwife

## 2017-05-15 ENCOUNTER — Other Ambulatory Visit: Payer: Self-pay

## 2017-05-15 ENCOUNTER — Encounter: Payer: Self-pay | Admitting: Certified Nurse Midwife

## 2017-05-15 VITALS — BP 99/73 | HR 99 | Wt 252.2 lb

## 2017-05-15 DIAGNOSIS — Z3493 Encounter for supervision of normal pregnancy, unspecified, third trimester: Secondary | ICD-10-CM

## 2017-05-15 LAB — POCT URINALYSIS DIPSTICK
Bilirubin, UA: NEGATIVE
Glucose, UA: NEGATIVE
Ketones, UA: NEGATIVE
LEUKOCYTES UA: NEGATIVE
NITRITE UA: NEGATIVE
PH UA: 6 (ref 5.0–8.0)
PROTEIN UA: NEGATIVE
Spec Grav, UA: 1.02 (ref 1.010–1.025)
UROBILINOGEN UA: 0.2 U/dL

## 2017-05-15 NOTE — Progress Notes (Signed)
ROB, doing well. Discussed weight gain and current Body mass index is 44.68 kg/m.' Anesthesia consult placed. She complains of coughing at night stating that it is caused by drainage. Suggested sudafed before bed and a cough suppressant. She verbalizes understanding and agrees to plan. Follow up as scheduled.    Philip Aspen, CNM

## 2017-05-15 NOTE — Patient Instructions (Signed)
Group B Streptococcus Infection During Pregnancy Group B Streptococcus (GBS) is a type of bacteria (Streptococcus agalactiae) that is often found in healthy people, commonly in the rectum, vagina, and intestines. In people who are healthy and not pregnant, the bacteria rarely cause serious illness or complications. However, women who test positive for GBS during pregnancy can pass the bacteria to their baby during childbirth, which can cause serious infection in the baby after birth. Women with GBS may also have infections during their pregnancy or immediately after childbirth, such as such as urinary tract infections (UTIs) or infections of the uterus (uterine infections). Having GBS also increases a woman's risk of complications during pregnancy, such as early (preterm) labor or delivery, miscarriage, or stillbirth. Routine testing (screening) for GBS is recommended for all pregnant women. What increases the risk? You may have a higher risk for GBS infection during pregnancy if you had one during a past pregnancy. What are the signs or symptoms? In most cases, GBS infection does not cause symptoms in pregnant women. Signs and symptoms of a possible GBS-related infection may include:  Labor starting before the 37th week of pregnancy.  A UTI or bladder infection, which may cause: ? Fever. ? Pain or burning during urination. ? Frequent urination.  Fever during labor, along with: ? Bad-smelling discharge. ? Uterine tenderness. ? Rapid heartbeat in the mother, baby, or both.  Rare but serious symptoms of a possible GBS-related infection in women include:  Blood infection (septicemia). This may cause fever, chills, or confusion.  Lung infection (pneumonia). This may cause fever, chills, cough, rapid breathing, difficulty breathing, or chest pain.  Bone, joint, skin, or soft tissue infection.  How is this diagnosed? You may be screened for GBS between week 35 and week 37 of your pregnancy. If  you have symptoms of preterm labor, you may be screened earlier. This condition is diagnosed based on lab test results from:  A swab of fluid from the vagina and rectum.  A urine sample.  How is this treated? This condition is treated with antibiotic medicine. When you go into labor, or as soon as your water breaks (your membranes rupture), you will be given antibiotics through an IV tube. Antibiotics will continue until after you give birth. If you are having a cesarean delivery, you do not need antibiotics unless your membranes have already ruptured. Follow these instructions at home:  Take over-the-counter and prescription medicines only as told by your health care provider.  Take your antibiotic medicine as told by your health care provider. Do not stop taking the antibiotic even if you start to feel better.  Keep all pre-birth (prenatal) visits and follow-up visits as told by your health care provider. This is important. Contact a health care provider if:  You have pain or burning when you urinate.  You have to urinate frequently.  You have a fever or chills.  You develop a bad-smelling vaginal discharge. Get help right away if:  Your membranes rupture.  You go into labor.  You have severe pain in your abdomen.  You have difficulty breathing.  You have chest pain. This information is not intended to replace advice given to you by your health care provider. Make sure you discuss any questions you have with your health care provider. Document Released: 09/30/2007 Document Revised: 01/18/2016 Document Reviewed: 01/17/2016 Elsevier Interactive Patient Education  2018 [employer]  

## 2017-05-17 ENCOUNTER — Encounter: Payer: Self-pay | Admitting: Family Medicine

## 2017-05-18 ENCOUNTER — Ambulatory Visit (INDEPENDENT_AMBULATORY_CARE_PROVIDER_SITE_OTHER): Payer: Managed Care, Other (non HMO) | Admitting: Certified Nurse Midwife

## 2017-05-18 ENCOUNTER — Telehealth: Payer: Self-pay | Admitting: Certified Nurse Midwife

## 2017-05-18 ENCOUNTER — Encounter: Payer: Self-pay | Admitting: Family Medicine

## 2017-05-18 VITALS — BP 102/66 | HR 106

## 2017-05-18 DIAGNOSIS — N898 Other specified noninflammatory disorders of vagina: Secondary | ICD-10-CM

## 2017-05-18 DIAGNOSIS — O26893 Other specified pregnancy related conditions, third trimester: Secondary | ICD-10-CM

## 2017-05-18 DIAGNOSIS — Z3483 Encounter for supervision of other normal pregnancy, third trimester: Secondary | ICD-10-CM

## 2017-05-18 DIAGNOSIS — O4700 False labor before 37 completed weeks of gestation, unspecified trimester: Secondary | ICD-10-CM

## 2017-05-18 DIAGNOSIS — O479 False labor, unspecified: Secondary | ICD-10-CM

## 2017-05-18 DIAGNOSIS — Z3689 Encounter for other specified antenatal screening: Secondary | ICD-10-CM

## 2017-05-18 LAB — POCT URINALYSIS DIPSTICK
Bilirubin, UA: NEGATIVE
Blood, UA: NEGATIVE
Glucose, UA: NEGATIVE
Ketones, UA: NEGATIVE
Leukocytes, UA: NEGATIVE
Nitrite, UA: NEGATIVE
Protein, UA: NEGATIVE
Spec Grav, UA: 1.01 (ref 1.010–1.025)
Urobilinogen, UA: 0.2 U/dL
pH, UA: 5 (ref 5.0–8.0)

## 2017-05-18 NOTE — Telephone Encounter (Signed)
Patient states that she is having 10-12 contractions in the last hour lasting 30 seconds to 1 minute   Per Sharyn Lull - add to schedule - scheduled with patient - she states she is leaving work and otw

## 2017-05-18 NOTE — Progress Notes (Signed)
Subjective:   Kathryn Lozano is a 31 y.o. G2P0010 [redacted]w[redacted]d being seen today for work in problem obstetrical visit.  Patient reports occasional contractions.  Denies vaginal bleeding or leaking of fluid.  Reports good fetal movement.  Denies difficulty breathing or respiratory distress, chest pain, abdominal pain, dysuria, and leg pain.   The following portions of the patient's history were reviewed and updated as appropriate: allergies, current medications, past family history, past medical history, past social history, past surgical history and problem list.   Objective:     FHT: Fetal Heart Rate (bpm): 135  Uterine Size: Fundal Height: 35 cm  Fetal Movement: Movement: Present  Presentation: Presentation: Vertex    Abdomen:  soft, gravid, appropriate for gestational age,non-tender  Vaginal:  Discharge, white    Cervix: Closed,Long,Floating, firm,posterior    Results for orders placed or performed in visit on 05/18/17 (from the past 24 hour(s))  POCT urinalysis dipstick     Status: None   Collection Time: 05/18/17  3:38 PM  Result Value Ref Range   Color, UA yellow    Clarity, UA clear    Glucose, UA neg    Bilirubin, UA neg    Ketones, UA neg    Spec Grav, UA 1.010 1.010 - 1.025   Blood, UA neg    pH, UA 5.0 5.0 - 8.0   Protein, UA neg    Urobilinogen, UA 0.2 0.2 or 1.0 E.U./dL   Nitrite, UA neg    Leukocytes, UA Negative Negative    Assessment:   Pregnancy:  G2P0010 at [redacted]w[redacted]d  1. Supervision of normal intrauterine pregnancy in multigravida in third trimester  - POCT urinalysis dipstick  2. Preterm uterine contractions  3. Reactive NST  4. Vaginal discharge  Plan:   Labs: FFN and NuSwab collected, will contact pt via MyChart with results.   Preterm labor symptoms: vaginal bleeding, contractions and leaking of fluid reviewed in detail.  Fetal movement precautions reviewed.  Follow up in 2 weeks for 36 week cultures and ROB with Melody.   Diona Fanti, CNM

## 2017-05-18 NOTE — Patient Instructions (Signed)

## 2017-05-19 LAB — FETAL FIBRONECTIN: Fetal Fibronectin: NEGATIVE

## 2017-05-20 LAB — NUSWAB VAGINITIS (VG)
CANDIDA ALBICANS, NAA: NEGATIVE
Candida glabrata, NAA: NEGATIVE
Trich vag by NAA: NEGATIVE

## 2017-05-21 ENCOUNTER — Encounter
Admission: RE | Admit: 2017-05-21 | Discharge: 2017-05-21 | Disposition: A | Discharge status: Home / Self Care | Payer: Managed Care, Other (non HMO) | Source: Ambulatory Visit | Attending: Anesthesiology | Admitting: Anesthesiology

## 2017-05-21 NOTE — Consult Note (Signed)
Anesthesiology consult note ( Ambulatory referral to OB anesthesia for morbid obesity) :       I had the distinct pleasure of meeting Kathryn Lozano today for an OB anesthesia precheck.  She has a Hx of morbid obesity with a BMI today of 44 which puts her at the borderline for BMI.  Patient reports no previous problems with anesthesia, no problems with HTN or Asthma and has a MP score of 3.  She is [redacted] weeks pregnant.  We discussed that in the next few weeks, she will need to watch her diet carefully and attempt some weight control with more nutritional choices along with water as the liquid of choice.  We discussed the possibility that if her weight increases much above 50 BMI we may have to make arrangements for delivery at a major medical center.  That her weight could make her higher risk from an anesthesia standpoint.  Patient appears to understand.  Obviously, her weight will need to be monitored closely in the [facility] clinic and arrangements made if a marked change in BMI.  If she can stay at a BMI of 50 - 52 or less, we can deliver here.

## 2017-05-22 ENCOUNTER — Encounter: Payer: Managed Care, Other (non HMO) | Admitting: Certified Nurse Midwife

## 2017-05-26 ENCOUNTER — Observation Stay
Admission: EM | Admit: 2017-05-26 | Discharge: 2017-05-26 | Disposition: A | Discharge status: Home / Self Care | Payer: Managed Care, Other (non HMO) | Attending: Certified Nurse Midwife | Admitting: Certified Nurse Midwife

## 2017-05-26 ENCOUNTER — Telehealth: Payer: Self-pay

## 2017-05-26 DIAGNOSIS — Z3A36 36 weeks gestation of pregnancy: Secondary | ICD-10-CM

## 2017-05-26 DIAGNOSIS — O9989 Other specified diseases and conditions complicating pregnancy, childbirth and the puerperium: Secondary | ICD-10-CM | POA: Diagnosis not present

## 2017-05-26 DIAGNOSIS — Z79899 Other long term (current) drug therapy: Secondary | ICD-10-CM | POA: Diagnosis not present

## 2017-05-26 DIAGNOSIS — R350 Frequency of micturition: Secondary | ICD-10-CM | POA: Insufficient documentation

## 2017-05-26 LAB — URINALYSIS, ROUTINE W REFLEX MICROSCOPIC
Bilirubin Urine: NEGATIVE
GLUCOSE, UA: NEGATIVE mg/dL
Hgb urine dipstick: NEGATIVE
Ketones, ur: NEGATIVE mg/dL
LEUKOCYTES UA: NEGATIVE
NITRITE: NEGATIVE
PH: 7 (ref 5.0–8.0)
Protein, ur: NEGATIVE mg/dL
SPECIFIC GRAVITY, URINE: 1.011 (ref 1.005–1.030)

## 2017-05-26 LAB — ROM PLUS (ARMC ONLY): Rom Plus: NEGATIVE

## 2017-05-26 NOTE — OB Triage Note (Signed)
Patient arrived on unit via wheelchair from ED with complaints of "my water broke" about 30 minutes ago.  Patient also complains of constant pain 7/10 that started about 15 minutes ago.  Patient has no other complaints

## 2017-05-26 NOTE — OB Triage Note (Signed)
Patient discharged home ambulatory and in stable condition after verbalizing understanding of all written discharge instructions.  AVS provided to patient.

## 2017-05-26 NOTE — Telephone Encounter (Signed)
Pt calls c/o pain/contraction that made her nauseated on Sunday. Today, she was walking earlier and had to sit down, lasted for 4-5 minutes. Stools have been loose. The tightening of her abdomen remains but not as intense. No vaginal bleeding, no leakage of vaginal fluid. Pt states she has kept her feet up and drinking lots of fluid. She is at work so she is unable to lie down to see if pain goes away. At 12:12 pt calls again and spoke with [facility]. She c/o fluid leaking down her leg. She was sent to L&D,

## 2017-05-26 NOTE — OB Triage Note (Signed)
L&D OB Triage Note  SUBJECTIVE Kathryn Lozano is a 31 y.o. G2P0010 female at [redacted]w[redacted]d, EDD Estimated Date of Delivery: 06/23/17 who presented to triage with complaints of leaking of fluid and urinary frequency.   Obstetric History   G2   P0   T0   P0   A1   L0    SAB1   TAB0   Ectopic0   Multiple0   Live Births0     # Outcome Date GA Lbr Len/2nd Weight Sex Delivery Anes PTL Lv  2 Current           1 SAB 2017 [redacted]w[redacted]d    SAB         Medications Prior to Admission  Medication Sig Dispense Refill Last Dose  . HOMEOPATHIC PRODUCTS PO Take 6 capsules by mouth daily.   Taking  . Magnesium Oxide 400 MG CAPS Take 1 capsule (400 mg total) by mouth 2 (two) times daily. 60 capsule 2 Taking  . Misc. Devices (BREAST PUMP) MISC Dispense one breast pump for patient 1 each 0 Taking  . montelukast (SINGULAIR) 10 MG tablet TAKE 1 TABLET BY MOUTH ONCE DAILY AS DIRECTED 30 tablet 7 Taking  . pantoprazole (PROTONIX) 40 MG tablet Take 1 tablet (40 mg total) by mouth daily. 30 tablet 6 Taking  . Prenatal Vit-Fe Fumarate-FA (PRENATAL MULTIVITAMIN) TABS tablet Take 1 tablet by mouth daily at 12 noon.   Taking     OBJECTIVE  Nursing Evaluation:   BP 126/77 (BP Location: Right Arm)   Pulse (!) 105   Temp 98.2 F (36.8 C) (Oral)   Resp 20   Ht 5\' 3"  (1.6 m)   Wt 255 lb (115.7 kg)   LMP 09/16/2016 (Exact Date)   BMI 45.17 kg/m    Findings:  negative Nitrazine, no fluid seen on exam   NST was performed and has been reviewed by me.  NST INTERPRETATION: Category I  Mode: External Baseline Rate (A): 145 bpm Variability: Moderate  Accelerations present Decelerations absent       Contraction Frequency (min): None  ASSESSMENT Impression:  1.  Pregnancy:  G2P0010 at [redacted]w[redacted]d , EDD Estimated Date of Delivery: 06/23/17 2.  NST:  reactive 3. ROM plus negative 4. U/A negative  PLAN 1. Reassurance given 2. Discharge home with standard labor precautions given to return to L&D or call the office for  problems.  3. Continue routine prenatal care.  Philip Aspen, CNM

## 2017-05-27 ENCOUNTER — Other Ambulatory Visit: Payer: Self-pay

## 2017-05-27 ENCOUNTER — Encounter: Payer: Self-pay | Admitting: Certified Nurse Midwife

## 2017-05-27 ENCOUNTER — Ambulatory Visit (INDEPENDENT_AMBULATORY_CARE_PROVIDER_SITE_OTHER): Payer: Managed Care, Other (non HMO) | Admitting: Certified Nurse Midwife

## 2017-05-27 VITALS — BP 113/77 | HR 80 | Wt 252.4 lb

## 2017-05-27 DIAGNOSIS — Z113 Encounter for screening for infections with a predominantly sexual mode of transmission: Secondary | ICD-10-CM

## 2017-05-27 DIAGNOSIS — Z369 Encounter for antenatal screening, unspecified: Secondary | ICD-10-CM

## 2017-05-27 DIAGNOSIS — Z3483 Encounter for supervision of other normal pregnancy, third trimester: Secondary | ICD-10-CM

## 2017-05-27 LAB — POCT URINALYSIS DIPSTICK
Bilirubin, UA: NEGATIVE
Blood, UA: NEGATIVE
Glucose, UA: NEGATIVE
KETONES UA: NEGATIVE
LEUKOCYTES UA: NEGATIVE
Nitrite, UA: NEGATIVE
PROTEIN UA: NEGATIVE
Spec Grav, UA: 1.015 (ref 1.010–1.025)
Urobilinogen, UA: 0.2 E.U./dL
pH, UA: 7 (ref 5.0–8.0)

## 2017-05-27 NOTE — Progress Notes (Signed)
ROB doing well. No complaints. GBS today. Discussed labor precautions. Follow up 1 wk.   Philip Aspen, CNM

## 2017-05-27 NOTE — Patient Instructions (Signed)
Group B Streptococcus Infection During Pregnancy Group B Streptococcus (GBS) is a type of bacteria (Streptococcus agalactiae) that is often found in healthy people, commonly in the rectum, vagina, and intestines. In people who are healthy and not pregnant, the bacteria rarely cause serious illness or complications. However, women who test positive for GBS during pregnancy can pass the bacteria to their baby during childbirth, which can cause serious infection in the baby after birth. Women with GBS may also have infections during their pregnancy or immediately after childbirth, such as such as urinary tract infections (UTIs) or infections of the uterus (uterine infections). Having GBS also increases a woman's risk of complications during pregnancy, such as early (preterm) labor or delivery, miscarriage, or stillbirth. Routine testing (screening) for GBS is recommended for all pregnant women. What increases the risk? You may have a higher risk for GBS infection during pregnancy if you had one during a past pregnancy. What are the signs or symptoms? In most cases, GBS infection does not cause symptoms in pregnant women. Signs and symptoms of a possible GBS-related infection may include:  Labor starting before the 37th week of pregnancy.  A UTI or bladder infection, which may cause: ? Fever. ? Pain or burning during urination. ? Frequent urination.  Fever during labor, along with: ? Bad-smelling discharge. ? Uterine tenderness. ? Rapid heartbeat in the mother, baby, or both.  Rare but serious symptoms of a possible GBS-related infection in women include:  Blood infection (septicemia). This may cause fever, chills, or confusion.  Lung infection (pneumonia). This may cause fever, chills, cough, rapid breathing, difficulty breathing, or chest pain.  Bone, joint, skin, or soft tissue infection.  How is this diagnosed? You may be screened for GBS between week 35 and week 37 of your pregnancy. If  you have symptoms of preterm labor, you may be screened earlier. This condition is diagnosed based on lab test results from:  A swab of fluid from the vagina and rectum.  A urine sample.  How is this treated? This condition is treated with antibiotic medicine. When you go into labor, or as soon as your water breaks (your membranes rupture), you will be given antibiotics through an IV tube. Antibiotics will continue until after you give birth. If you are having a cesarean delivery, you do not need antibiotics unless your membranes have already ruptured. Follow these instructions at home:  Take over-the-counter and prescription medicines only as told by your health care provider.  Take your antibiotic medicine as told by your health care provider. Do not stop taking the antibiotic even if you start to feel better.  Keep all pre-birth (prenatal) visits and follow-up visits as told by your health care provider. This is important. Contact a health care provider if:  You have pain or burning when you urinate.  You have to urinate frequently.  You have a fever or chills.  You develop a bad-smelling vaginal discharge. Get help right away if:  Your membranes rupture.  You go into labor.  You have severe pain in your abdomen.  You have difficulty breathing.  You have chest pain. This information is not intended to replace advice given to you by your health care provider. Make sure you discuss any questions you have with your health care provider. Document Released: 09/30/2007 Document Revised: 01/18/2016 Document Reviewed: 01/17/2016 Elsevier Interactive Patient Education  2018 [employer]  

## 2017-05-29 ENCOUNTER — Encounter: Payer: Self-pay | Admitting: Certified Nurse Midwife

## 2017-05-29 LAB — STREP GP B NAA: STREP GROUP B AG: POSITIVE — AB

## 2017-05-30 LAB — GC/CHLAMYDIA PROBE AMP
Chlamydia trachomatis, NAA: NEGATIVE
NEISSERIA GONORRHOEAE BY PCR: NEGATIVE

## 2017-06-05 ENCOUNTER — Ambulatory Visit (INDEPENDENT_AMBULATORY_CARE_PROVIDER_SITE_OTHER): Payer: Managed Care, Other (non HMO) | Admitting: Obstetrics and Gynecology

## 2017-06-05 VITALS — BP 113/75 | HR 116 | Wt 255.4 lb

## 2017-06-05 DIAGNOSIS — Z3493 Encounter for supervision of normal pregnancy, unspecified, third trimester: Secondary | ICD-10-CM

## 2017-06-05 LAB — POCT URINALYSIS DIPSTICK
BILIRUBIN UA: NEGATIVE
GLUCOSE UA: NEGATIVE
KETONES UA: NEGATIVE
Leukocytes, UA: NEGATIVE
NITRITE UA: NEGATIVE
PH UA: 7 (ref 5.0–8.0)
Protein, UA: NEGATIVE
RBC UA: NEGATIVE
Spec Grav, UA: 1.01 (ref 1.010–1.025)
Urobilinogen, UA: 0.2 E.U./dL

## 2017-06-05 NOTE — Progress Notes (Signed)
ROB- labor precautions discussed. GBS+ discussed.

## 2017-06-05 NOTE — Progress Notes (Signed)
ROB- pt is having a lot of pelvic pressure, some contractions

## 2017-06-12 ENCOUNTER — Ambulatory Visit (INDEPENDENT_AMBULATORY_CARE_PROVIDER_SITE_OTHER): Payer: Managed Care, Other (non HMO) | Admitting: Certified Nurse Midwife

## 2017-06-12 ENCOUNTER — Encounter: Payer: Self-pay | Admitting: Certified Nurse Midwife

## 2017-06-12 VITALS — BP 119/82 | HR 91 | Wt 258.3 lb

## 2017-06-12 DIAGNOSIS — Z3493 Encounter for supervision of normal pregnancy, unspecified, third trimester: Secondary | ICD-10-CM

## 2017-06-12 NOTE — Progress Notes (Signed)
Pt is here for an ROB visit. 

## 2017-06-12 NOTE — Progress Notes (Signed)
Body mass index is 45.76 kg/m.  ROB, doing well. She is having fetal movement. She has 2+ edema to the knee, she denies headache or visual changes. Discussed remaining prenatal care . She verbalizes understanding and agrees.SVE per pt request. 1/70/-3.  Follow up 1 wk.   Philip Aspen, CNM

## 2017-06-12 NOTE — Patient Instructions (Signed)

## 2017-06-16 ENCOUNTER — Other Ambulatory Visit: Payer: Self-pay | Admitting: Certified Nurse Midwife

## 2017-06-16 DIAGNOSIS — Z369 Encounter for antenatal screening, unspecified: Secondary | ICD-10-CM

## 2017-06-17 ENCOUNTER — Ambulatory Visit (INDEPENDENT_AMBULATORY_CARE_PROVIDER_SITE_OTHER): Payer: Managed Care, Other (non HMO) | Admitting: Certified Nurse Midwife

## 2017-06-17 VITALS — BP 128/84 | HR 99 | Wt 260.0 lb

## 2017-06-17 DIAGNOSIS — N898 Other specified noninflammatory disorders of vagina: Secondary | ICD-10-CM

## 2017-06-17 DIAGNOSIS — Z3493 Encounter for supervision of normal pregnancy, unspecified, third trimester: Secondary | ICD-10-CM | POA: Diagnosis not present

## 2017-06-17 DIAGNOSIS — L299 Pruritus, unspecified: Secondary | ICD-10-CM

## 2017-06-17 LAB — POCT URINALYSIS DIPSTICK
BILIRUBIN UA: NEGATIVE
GLUCOSE UA: NEGATIVE
KETONES UA: 5
Nitrite, UA: NEGATIVE
SPEC GRAV UA: 1.01 (ref 1.010–1.025)
UROBILINOGEN UA: 0.2 U/dL
pH, UA: 6.5 (ref 5.0–8.0)

## 2017-06-17 NOTE — Progress Notes (Signed)
OB WORK IN- pt is having a lot of itching- vaginal, lower abdomen, ? Thinks she is "leaking fluid", has a rash on her body that itches

## 2017-06-17 NOTE — Patient Instructions (Signed)

## 2017-06-17 NOTE — Progress Notes (Signed)
Body mass index is 46.06 kg/m. ROB, pt complains of itching and rash all over. Small pin point rash noted . Labs today r/o cholestasis. She also complains of increased vaginal discharge with itching. Nitrazine negative, fern negative. Will follow up with lab results. Encouraged bendryl and  Cortisone cream to rash as needed. Follow up as scheduled.   Philip Aspen, CNM

## 2017-06-18 LAB — COMPREHENSIVE METABOLIC PANEL
ALBUMIN: 3.4 g/dL — AB (ref 3.5–5.5)
ALK PHOS: 153 IU/L — AB (ref 39–117)
ALT: 19 IU/L (ref 0–32)
AST: 21 IU/L (ref 0–40)
Albumin/Globulin Ratio: 1.3 (ref 1.2–2.2)
BUN / CREAT RATIO: 14 (ref 9–23)
BUN: 6 mg/dL (ref 6–20)
Bilirubin Total: <0.2 mg/dL (ref 0.0–1.2)
CO2: 20 mmol/L (ref 20–29)
CREATININE: 0.42 mg/dL — AB (ref 0.57–1.00)
Calcium: 9 mg/dL (ref 8.7–10.2)
Chloride: 103 mmol/L (ref 96–106)
GFR, EST AFRICAN AMERICAN: 158 mL/min/{1.73_m2} (ref >59)
GFR, EST NON AFRICAN AMERICAN: 137 mL/min/{1.73_m2} (ref >59)
GLOBULIN, TOTAL: 2.6 g/dL (ref 1.5–4.5)
Glucose: 96 mg/dL (ref 65–99)
Potassium: 4.1 mmol/L (ref 3.5–5.2)
SODIUM: 137 mmol/L (ref 134–144)
Total Protein: 6 g/dL (ref 6.0–8.5)

## 2017-06-18 LAB — BILE ACIDS, TOTAL: BILE ACIDS TOTAL: 8 umol/L (ref 4.7–24.5)

## 2017-06-18 LAB — BILIRUBIN, DIRECT: Bilirubin, Direct: 0.08 mg/dL (ref 0.00–0.40)

## 2017-06-19 ENCOUNTER — Ambulatory Visit (INDEPENDENT_AMBULATORY_CARE_PROVIDER_SITE_OTHER): Payer: Managed Care, Other (non HMO) | Admitting: Certified Nurse Midwife

## 2017-06-19 ENCOUNTER — Encounter: Payer: Managed Care, Other (non HMO) | Admitting: Certified Nurse Midwife

## 2017-06-19 ENCOUNTER — Encounter: Payer: Self-pay | Admitting: Certified Nurse Midwife

## 2017-06-19 VITALS — BP 127/82 | HR 106 | Wt 261.1 lb

## 2017-06-19 DIAGNOSIS — Z3493 Encounter for supervision of normal pregnancy, unspecified, third trimester: Secondary | ICD-10-CM

## 2017-06-19 LAB — POCT URINALYSIS DIPSTICK
BILIRUBIN UA: NEGATIVE
Blood, UA: NEGATIVE
Glucose, UA: NEGATIVE
KETONES UA: NEGATIVE
Leukocytes, UA: NEGATIVE
Nitrite, UA: NEGATIVE
PH UA: 6.5 (ref 5.0–8.0)
Protein, UA: NEGATIVE
SPEC GRAV UA: 1.01 (ref 1.010–1.025)
UROBILINOGEN UA: 0.2 U/dL

## 2017-06-19 MED ORDER — HYDROXYZINE HCL 25 MG PO TABS: 25.0000 mg | ORAL_TABLET | Freq: Four times a day (QID) | ORAL | 2 refills | Status: DC | PRN

## 2017-06-19 MED ORDER — HYDROCORTISONE 1 % EX LOTN: 1.0000 "application " | TOPICAL_LOTION | Freq: Two times a day (BID) | CUTANEOUS | 0 refills | Status: DC

## 2017-06-19 NOTE — Progress Notes (Signed)
ROB- pt is having a lot of pelvic pressure, having a lot of bowel movements

## 2017-06-19 NOTE — Progress Notes (Signed)
ROB-Continues to itch. Reports diarrhea and pelvic pressure. Discussed home treatment measures. Rx: Atarax, see orders. SVE unchanged. Reviewed red flag symptoms and when to call. RTC x 1 week for NST and ROB with Melody or sooner if needed.

## 2017-06-19 NOTE — Patient Instructions (Addendum)
Skin Conditions During Pregnancy Pregnancy affects many parts of your body. One part is your skin. Most skin problems that develop during pregnancy are not serious and are considered a normal part of pregnancy. They go away on their own after the baby is born. Other skin problems may need treatment. What type of skin problems can develop during pregnancy?  Stretch marks. Stretch marks are purple or pink lines on the skin. They may appear on the belly, breasts, thighs, or buttocks. Stretch marks are caused by weight gain that causes the skin to stretch. Stretch marks do not cause problems. Almost all women get them during pregnancy.  Darkening of the skin (hyperpigmentation). The darkening may occur in patches or as a line. Patches may appear on the face, nipples, or genital area. Lines often stretch from the belly button to the pubic area. Hyperpigmentation develops in almost all pregnant women. It is more severe in women with a dark complexion.  Spider angiomas. These are tiny pink or red lines that go out from a center point, like the legs of a spider. Usually, they are on the face, neck, and arms. They do not cause problems. They are most common in women with light complexions.  Palmar erythema. This is a reddening of the palms. It is most common in women with light complexions.  Swelling and redness. This can occur on the face, eyelids, fingers, or toes.  Pruritic urticarial papules and plaques of pregnancy (PUPPP). This is a rash that is itchy, red, and has tiny blisters. The cause is unknown. It usually starts on the abdomen and may affect the arms or legs. It does not affect the face. It usually begins later in pregnancy. About a third of all pregnant women develop this condition. There are no associated problems to the fetus with this rash. Sometimes, oral steroids are used to calm down the itch. The rash clears after the baby is born.  Prurigo of pregnancy. This is a disease in which red  patches and bumps appear on the arms and legs. The cause is unknown. The patches and bumps clear after the baby is born. About a third of pregnant women develop this disease.  Acne. Pimples may develop, including in women who have had clear skin for a long time.  Skin tags. These are small flaps of skin that stick out from the body. They may grow or become darker during pregnancy. They are usually harmless.  Moles. These are flat or slightly raised growths. They are usually round and pink or brown. They may grow or become darker during pregnancy.  Intrahepatic cholestasis of pregnancy. This is a rare condition that causes itchy skin. It may run in families. It increases the risk of complications for the fetus. This condition usually resolves after delivery. It can recur with subsequent pregnancies.  Impetigo herpetiformis. This is a form of a severe skin disease called pustular psoriasis. Usually, delivery is the only method of resolving the condition.  Pruritic folliculitis of pregnancy. This is a rare condition that causes pimple-like skin growths. It develops in the middle or later stages of pregnancy. Its cause is unknown.It usually resolves 2-3 weeks after delivery.  Pemphigoid gestationis. This is a very rare autoimmune disease. It causes a severely itchy rash and blisters. The rash does not appear on the face, scalp, or inside of the mouth. It usually resolves 3 months after delivery. It may recur with subsequent pregnancies. Some pre-existing skin conditions, such as atopic dermatitis, may become worse  during pregnancy. Follow these instructions at home: Different conditions may have different instructions. In general:  Follow all your health care provider's directions about medicines to treat skin problems while you are pregnant. Do not use any over-the-counter medicines (including medicated creams and lotions) until you have checked with your health care provider. Many medicines are not  safe to use when you are pregnant.  Avoid time in the sun. This will help keep your skin from darkening. When you must be outside, use sunscreen and wear a hat with a wide brim to protect your face. The sunscreen should have a SPF of at least 10. This may help limit dark spots that develop when the skin is exposed to the sun.  To avoid problems from stretched skin: ? Do not sit or stand for long periods of time. ? Exercise regularly. This helps keep your skin in good condition.  Use a gentle soap. This helps prevent acne.  Do not get too hot or too sweaty. This makes some skin rashes worse.  Wear loose clothes made of a soft fabric. This prevents skin irritation.  For itching, add oatmeal or cornstarch to your bathwater.  Use a skin moisturizer. Ask your health care provider for suggestions.  This information is not intended to replace advice given to you by your health care provider. Make sure you discuss any questions you have with your health care provider. Document Released: 07/26/2010 Document Revised: 11/29/2015 Document Reviewed: 04/04/2013 Elsevier Interactive Patient Education  2018 [employer]. Vaginal Delivery Vaginal delivery means that you will give birth by pushing your baby out of your birth canal (vagina). A team of health care providers will help you before, during, and after vaginal delivery. Birth experiences are unique for every woman and every pregnancy, and birth experiences vary depending on where you choose to give birth. What should I do to prepare for my baby's birth? Before your baby is born, it is important to talk with your health care provider about:  Your labor and delivery preferences. These may include: ? Medicines that you may be given. ? How you will manage your pain. This might include non-medical pain relief techniques or injectable pain relief such as epidural analgesia. ? How you and your baby will be monitored during labor and delivery. ? Who  may be in the labor and delivery room with you. ? Your feelings about surgical delivery of your baby (cesarean delivery, or C-section) if this becomes necessary. ? Your feelings about receiving donated blood through an IV tube (blood transfusion) if this becomes necessary.  Whether you are able: ? To take pictures or videos of the birth. ? To eat during labor and delivery. ? To move around, walk, or change positions during labor and delivery.  What to expect after your baby is born, such as: ? Whether delayed umbilical cord clamping and cutting is offered. ? Who will care for your baby right after birth. ? Medicines or tests that may be recommended for your baby. ? Whether breastfeeding is supported in your hospital or birth center. ? How long you will be in the hospital or birth center.  How any medical conditions you have may affect your baby or your labor and delivery experience.  To prepare for your baby's birth, you should also:  Attend all of your health care visits before delivery (prenatal visits) as recommended by your health care provider. This is important.  Prepare your home for your baby's arrival. Make sure that you have: ?  Diapers. ? Baby clothing. ? Feeding equipment. ? Safe sleeping arrangements for you and your baby.  Install a car seat in your vehicle. Have your car seat checked by a certified car seat installer to make sure that it is installed safely.  Think about who will help you with your new baby at home for at least the first several weeks after delivery.  What can I expect when I arrive at the birth center or hospital? Once you are in labor and have been admitted into the hospital or birth center, your health care provider may:  Review your pregnancy history and any concerns you have.  Insert an IV tube into one of your veins. This is used to give you fluids and medicines.  Check your blood pressure, pulse, temperature, and heart rate (vital  signs).  Check whether your bag of water (amniotic sac) has broken (ruptured).  Talk with you about your birth plan and discuss pain control options.  Monitoring Your health care provider may monitor your contractions (uterine monitoring) and your baby's heart rate (fetal monitoring). You may need to be monitored:  Often, but not continuously (intermittently).  All the time or for long periods at a time (continuously). Continuous monitoring may be needed if: ? You are taking certain medicines, such as medicine to relieve pain or make your contractions stronger. ? You have pregnancy or labor complications.  Monitoring may be done by:  Placing a special stethoscope or a handheld monitoring device on your abdomen to check your baby's heartbeat, and feeling your abdomen for contractions. This method of monitoring does not continuously record your baby's heartbeat or your contractions.  Placing monitors on your abdomen (external monitors) to record your baby's heartbeat and the frequency and length of contractions. You may not have to wear external monitors all the time.  Placing monitors inside of your uterus (internal monitors) to record your baby's heartbeat and the frequency, length, and strength of your contractions. ? Your health care provider may use internal monitors if he or she needs more information about the strength of your contractions or your baby's heart rate. ? Internal monitors are put in place by passing a thin, flexible wire through your vagina and into your uterus. Depending on the type of monitor, it may remain in your uterus or on your baby's head until birth. ? Your health care provider will discuss the benefits and risks of internal monitoring with you and will ask for your permission before inserting the monitors.  Telemetry. This is a type of continuous monitoring that can be done with external or internal monitors. Instead of having to stay in bed, you are able to  move around during telemetry. Ask your health care provider if telemetry is an option for you.  Physical exam Your health care provider may perform a physical exam. This may include:  Checking whether your baby is positioned: ? With the head toward your vagina (head-down). This is most common. ? With the head toward the top of your uterus (head-up or breech). If your baby is in a breech position, your health care provider may try to turn your baby to a head-down position so you can deliver vaginally. If it does not seem that your baby can be born vaginally, your provider may recommend surgery to deliver your baby. In rare cases, you may be able to deliver vaginally if your baby is head-up (breech delivery). ? Lying sideways (transverse). Babies that are lying sideways cannot be delivered vaginally.  Checking your cervix to determine: ? Whether it is thinning out (effacing). ? Whether it is opening up (dilating). ? How low your baby has moved into your birth canal.  What are the three stages of labor and delivery?  Normal labor and delivery is divided into the following three stages: Stage 1  Stage 1 is the longest stage of labor, and it can last for hours or days. Stage 1 includes: ? Early labor. This is when contractions may be irregular, or regular and mild. Generally, early labor contractions are more than 10 minutes apart. ? Active labor. This is when contractions get longer, more regular, more frequent, and more intense. ? The transition phase. This is when contractions happen very close together, are very intense, and may last longer than during any other part of labor.  Contractions generally feel mild, infrequent, and irregular at first. They get stronger, more frequent (about every 2-3 minutes), and more regular as you progress from early labor through active labor and transition.  Many women progress through stage 1 naturally, but you may need help to continue making progress. If  this happens, your health care provider may talk with you about: ? Rupturing your amniotic sac if it has not ruptured yet. ? Giving you medicine to help make your contractions stronger and more frequent.  Stage 1 ends when your cervix is completely dilated to 4 inches (10 cm) and completely effaced. This happens at the end of the transition phase. Stage 2  Once your cervix is completely effaced and dilated to 4 inches (10 cm), you may start to feel an urge to push. It is common for the body to naturally take a rest before feeling the urge to push, especially if you received an epidural or certain other pain medicines. This rest period may last for up to 1-2 hours, depending on your unique labor experience.  During stage 2, contractions are generally less painful, because pushing helps relieve contraction pain. Instead of contraction pain, you may feel stretching and burning pain, especially when the widest part of your baby's head passes through the vaginal opening (crowning).  Your health care provider will closely monitor your pushing progress and your baby's progress through the vagina during stage 2.  Your health care provider may massage the area of skin between your vaginal opening and anus (perineum) or apply warm compresses to your perineum. This helps it stretch as the baby's head starts to crown, which can help prevent perineal tearing. ? In some cases, an incision may be made in your perineum (episiotomy) to allow the baby to pass through the vaginal opening. An episiotomy helps to make the opening of the vagina larger to allow more room for the baby to fit through.  It is very important to breathe and focus so your health care provider can control the delivery of your baby's head. Your health care provider may have you decrease the intensity of your pushing, to help prevent perineal tearing.  After delivery of your baby's head, the shoulders and the rest of the body generally deliver  very quickly and without difficulty.  Once your baby is delivered, the umbilical cord may be cut right away, or this may be delayed for 1-2 minutes, depending on your baby's health. This may vary among health care providers, hospitals, and birth centers.  If you and your baby are healthy enough, your baby may be placed on your chest or abdomen to help maintain the baby's temperature and to help  you bond with each other. Some mothers and babies start breastfeeding at this time. Your health care team will dry your baby and help keep your baby warm during this time.  Your baby may need immediate care if he or she: ? Showed signs of distress during labor. ? Has a medical condition. ? Was born too early (prematurely). ? Had a bowel movement before birth (meconium). ? Shows signs of difficulty transitioning from being inside the uterus to being outside of the uterus. If you are planning to breastfeed, your health care team will help you begin a feeding. Stage 3  The third stage of labor starts immediately after the birth of your baby and ends after you deliver the placenta. The placenta is an organ that develops during pregnancy to provide oxygen and nutrients to your baby in the womb.  Delivering the placenta may require some pushing, and you may have mild contractions. Breastfeeding can stimulate contractions to help you deliver the placenta.  After the placenta is delivered, your uterus should tighten (contract) and become firm. This helps to stop bleeding in your uterus. To help your uterus contract and to control bleeding, your health care provider may: ? Give you medicine by injection, through an IV tube, by mouth, or through your rectum (rectally). ? Massage your abdomen or perform a vaginal exam to remove any blood clots that are left in your uterus. ? Empty your bladder by placing a thin, flexible tube (catheter) into your bladder. ? Encourage you to breastfeed your baby. After labor is  over, you and your baby will be monitored closely to ensure that you are both healthy until you are ready to go home. Your health care team will teach you how to care for yourself and your baby. This information is not intended to replace advice given to you by your health care provider. Make sure you discuss any questions you have with your health care provider. Document Released: 04/01/2008 Document Revised: 01/11/2016 Document Reviewed: 07/08/2015 Elsevier Interactive Patient Education  2018 [employer].

## 2017-06-21 LAB — NUSWAB BV AND CANDIDA, NAA
Candida albicans, NAA: NEGATIVE
Candida glabrata, NAA: NEGATIVE

## 2017-06-22 ENCOUNTER — Other Ambulatory Visit: Payer: Self-pay

## 2017-06-22 ENCOUNTER — Inpatient Hospital Stay
Admission: EM | Admit: 2017-06-22 | Discharge: 2017-06-24 | DRG: 788 | Disposition: A | Discharge status: Home / Self Care | Payer: Managed Care, Other (non HMO) | Attending: Obstetrics and Gynecology | Admitting: Obstetrics and Gynecology

## 2017-06-22 ENCOUNTER — Inpatient Hospital Stay: Payer: Managed Care, Other (non HMO) | Admitting: Registered Nurse

## 2017-06-22 DIAGNOSIS — O324XX0 Maternal care for high head at term, not applicable or unspecified: Secondary | ICD-10-CM | POA: Diagnosis present

## 2017-06-22 DIAGNOSIS — O99214 Obesity complicating childbirth: Secondary | ICD-10-CM | POA: Diagnosis present

## 2017-06-22 DIAGNOSIS — Z3483 Encounter for supervision of other normal pregnancy, third trimester: Secondary | ICD-10-CM | POA: Diagnosis present

## 2017-06-22 DIAGNOSIS — O99824 Streptococcus B carrier state complicating childbirth: Secondary | ICD-10-CM | POA: Diagnosis present

## 2017-06-22 DIAGNOSIS — Z3A39 39 weeks gestation of pregnancy: Secondary | ICD-10-CM | POA: Diagnosis not present

## 2017-06-22 LAB — TYPE AND SCREEN
ABO/RH(D): O POS
ANTIBODY SCREEN: NEGATIVE

## 2017-06-22 LAB — CBC
HCT: 39.2 % (ref 35.0–47.0)
Hemoglobin: 12.7 g/dL (ref 12.0–16.0)
MCH: 26.9 pg (ref 26.0–34.0)
MCHC: 32.5 g/dL (ref 32.0–36.0)
MCV: 82.8 fL (ref 80.0–100.0)
PLATELETS: 123 10*3/uL — AB (ref 150–440)
RBC: 4.73 MIL/uL (ref 3.80–5.20)
RDW: 15 % — AB (ref 11.5–14.5)
WBC: 8 10*3/uL (ref 3.6–11.0)

## 2017-06-22 MED ORDER — LACTATED RINGERS IV SOLN
500.0000 mL | Freq: Once | INTRAVENOUS | Status: AC
  Administered 2017-06-22: 500 mL via INTRAVENOUS

## 2017-06-22 MED ORDER — PHENYLEPHRINE 40 MCG/ML (10ML) SYRINGE FOR IV PUSH (FOR BLOOD PRESSURE SUPPORT): 80.0000 ug | PREFILLED_SYRINGE | INTRAVENOUS | Status: DC | PRN

## 2017-06-22 MED ORDER — EPHEDRINE 5 MG/ML INJ: 10.0000 mg | INTRAVENOUS | Status: DC | PRN

## 2017-06-22 MED ORDER — MISOPROSTOL 200 MCG PO TABS
ORAL_TABLET | ORAL | Status: AC
  Filled 2017-06-22: qty 4

## 2017-06-22 MED ORDER — AMMONIA AROMATIC IN INHA
RESPIRATORY_TRACT | Status: AC
  Filled 2017-06-22: qty 10

## 2017-06-22 MED ORDER — PENICILLIN G POT IN DEXTROSE 60000 UNIT/ML IV SOLN
3.0000 10*6.[IU] | INTRAVENOUS | Status: DC
  Administered 2017-06-22: 3 10*6.[IU] via INTRAVENOUS
  Filled 2017-06-22 (×6): qty 50

## 2017-06-22 MED ORDER — LACTATED RINGERS IV SOLN: 500.0000 mL | INTRAVENOUS | Status: DC | PRN

## 2017-06-22 MED ORDER — FENTANYL 2.5 MCG/ML W/ROPIVACAINE 0.15% IN NS 100 ML EPIDURAL (ARMC)
EPIDURAL | Status: DC | PRN
  Administered 2017-06-22: 12 mL/h via EPIDURAL

## 2017-06-22 MED ORDER — ALUM & MAG HYDROXIDE-SIMETH 200-200-20 MG/5ML PO SUSP
30.0000 mL | Freq: Once | ORAL | Status: AC
  Administered 2017-06-22: 30 mL via ORAL

## 2017-06-22 MED ORDER — OXYTOCIN 40 UNITS IN LACTATED RINGERS INFUSION - SIMPLE MED
1.0000 m[IU]/min | INTRAVENOUS | Status: DC
  Administered 2017-06-22: 1 m[IU]/min via INTRAVENOUS
  Filled 2017-06-22: qty 1000

## 2017-06-22 MED ORDER — PENICILLIN G POTASSIUM 5000000 UNITS IJ SOLR
5.0000 10*6.[IU] | Freq: Once | INTRAVENOUS | Status: AC
  Administered 2017-06-22: 5 10*6.[IU] via INTRAVENOUS
  Filled 2017-06-22: qty 5

## 2017-06-22 MED ORDER — LIDOCAINE HCL (PF) 1 % IJ SOLN
INTRAMUSCULAR | Status: DC | PRN
  Administered 2017-06-22: 3 mL via SUBCUTANEOUS

## 2017-06-22 MED ORDER — ALUM & MAG HYDROXIDE-SIMETH 200-200-20 MG/5ML PO SUSP
ORAL | Status: AC
  Administered 2017-06-22: 30 mL via ORAL
  Filled 2017-06-22: qty 30

## 2017-06-22 MED ORDER — FENTANYL CITRATE (PF) 100 MCG/2ML IJ SOLN
50.0000 ug | INTRAMUSCULAR | Status: DC | PRN
  Filled 2017-06-22: qty 2

## 2017-06-22 MED ORDER — SOD CITRATE-CITRIC ACID 500-334 MG/5ML PO SOLN
30.0000 mL | ORAL | Status: DC | PRN
  Administered 2017-06-23: 30 mL via ORAL
  Filled 2017-06-22: qty 15

## 2017-06-22 MED ORDER — FENTANYL 2.5 MCG/ML W/ROPIVACAINE 0.15% IN NS 100 ML EPIDURAL (ARMC)
12.0000 mL/h | EPIDURAL | Status: DC
  Administered 2017-06-22: 12 mL/h via EPIDURAL
  Filled 2017-06-22 (×2): qty 100

## 2017-06-22 MED ORDER — SODIUM CHLORIDE 0.9 % IV SOLN
INTRAVENOUS | Status: DC | PRN
  Administered 2017-06-22 (×3): 5 mL via EPIDURAL

## 2017-06-22 MED ORDER — BUPIVACAINE HCL (PF) 0.25 % IJ SOLN
INTRAMUSCULAR | Status: DC | PRN
  Administered 2017-06-22 (×2): 4 mL via EPIDURAL
  Administered 2017-06-23 (×4): 5 mL via EPIDURAL

## 2017-06-22 MED ORDER — OXYTOCIN 10 UNIT/ML IJ SOLN
INTRAMUSCULAR | Status: AC
  Filled 2017-06-22: qty 2

## 2017-06-22 MED ORDER — LIDOCAINE HCL (PF) 1 % IJ SOLN: 30.0000 mL | INTRAMUSCULAR | Status: DC | PRN

## 2017-06-22 MED ORDER — OXYTOCIN BOLUS FROM INFUSION: 500.0000 mL | Freq: Once | INTRAVENOUS | Status: DC

## 2017-06-22 MED ORDER — SODIUM CHLORIDE 0.9 % IV SOLN
1.0000 g | INTRAVENOUS | Status: DC
  Administered 2017-06-22 (×3): 1 g via INTRAVENOUS
  Filled 2017-06-22 (×3): qty 1000

## 2017-06-22 MED ORDER — LACTATED RINGERS IV SOLN
INTRAVENOUS | Status: DC
  Administered 2017-06-22 – 2017-06-23 (×5): via INTRAVENOUS

## 2017-06-22 MED ORDER — DIPHENHYDRAMINE HCL 50 MG/ML IJ SOLN: 12.5000 mg | INTRAMUSCULAR | Status: DC | PRN

## 2017-06-22 MED ORDER — ONDANSETRON HCL 4 MG/2ML IJ SOLN
4.0000 mg | Freq: Four times a day (QID) | INTRAMUSCULAR | Status: DC | PRN
  Administered 2017-06-22 (×2): 4 mg via INTRAVENOUS
  Filled 2017-06-22 (×2): qty 2

## 2017-06-22 MED ORDER — OXYTOCIN 10 UNIT/ML IJ SOLN: 10.0000 [IU] | Freq: Once | INTRAMUSCULAR | Status: DC

## 2017-06-22 MED ORDER — OXYTOCIN 40 UNITS IN LACTATED RINGERS INFUSION - SIMPLE MED
2.5000 [IU]/h | INTRAVENOUS | Status: DC
  Administered 2017-06-23: 1 mL via INTRAVENOUS
  Administered 2017-06-23: 699 mL via INTRAVENOUS

## 2017-06-22 MED ORDER — TERBUTALINE SULFATE 1 MG/ML IJ SOLN: 0.2500 mg | Freq: Once | INTRAMUSCULAR | Status: DC | PRN

## 2017-06-22 MED ORDER — LIDOCAINE HCL (PF) 1 % IJ SOLN
INTRAMUSCULAR | Status: AC
  Filled 2017-06-22: qty 30

## 2017-06-22 NOTE — Progress Notes (Signed)
AMELI SANGIOVANNI is a 31 y.o. G2P0010 at [redacted]w[redacted]d by LMP admitted for active labor, rupture of membranes  Subjective: Reports vaginal pressure  Objective: BP (!) 119/59 (BP Location: Right Arm)   Pulse (!) 107   Temp 98.8 F (37.1 C) (Oral)   Resp 18   Ht 5\' 3"  (1.6 m)   Wt 260 lb (117.9 kg)   LMP 09/16/2016 (Exact Date)   SpO2 100%   BMI 46.06 kg/m  I/O last 3 completed shifts: In: 2149.6 [I.V.:2099.6; IV Piggyback:50] Out: 1300 [Urine:1300] No intake/output data recorded.  FHT:  FHR: 147 bpm, variability: moderate,  accelerations:  Present,  decelerations:  Absent UC:   regular, every 2 minutes SVE:   Dilation: 10 Effacement (%): 100 Station: +1 Exam by:: Felisa Bonier CNM  Labs: Lab Results  Component Value Date   WBC 8.0 06/22/2017   HGB 12.7 06/22/2017   HCT 39.2 06/22/2017   MCV 82.8 06/22/2017   PLT 123 (L) 06/22/2017    Assessment / Plan: Augmentation of labor, progressing well, will begin pushing.  Labor: Progressing normally Preeclampsia:  labs stable Fetal Wellbeing:  Category I Pain Control:  Epidural I/D:  n/a Anticipated MOD:  NSVD  Melody N Shambley 06/22/2017, 8:29 PM

## 2017-06-22 NOTE — Progress Notes (Signed)
Kathryn Lozano is a 31 y.o. G2P0010 at [redacted]w[redacted]d by LMP admitted for rupture of membranes  Subjective:  States feeling vaginal pressure with contractions, and extremely congested nasally Objective: BP 130/65   Pulse (!) 106   Temp 98.8 F (37.1 C) (Oral)   Resp 18   Ht 5\' 3"  (1.6 m)   Wt 260 lb (117.9 kg)   LMP 09/16/2016 (Exact Date)   SpO2 97%   BMI 46.06 kg/m  I/O last 3 completed shifts: In: 2149.6 [I.V.:2099.6; IV Piggyback:50] Out: 1300 [Urine:1300] Total I/O In: -  Out: 100 [Urine:100]  FHT:  FHR: 142 bpm, variability: moderate,  accelerations:  Present,  decelerations:  Absent UC:   regular, every 2-3 minutes SVE:   Dilation: 10 Effacement (%): 100 Station: +1 Exam by:: Felisa Bonier CNM  Labs: Lab Results  Component Value Date   WBC 8.0 06/22/2017   HGB 12.7 06/22/2017   HCT 39.2 06/22/2017   MCV 82.8 06/22/2017   PLT 123 (L) 06/22/2017    Assessment / Plan: Augmentation of labor, progressing well  Labor: slow progression in second stage, suspected ROP with mild rotation in side-lying pushing. Preeclampsia:  labs stable Fetal Wellbeing:  Category I Pain Control:  Epidural I/D:  Kathryn/a Anticipated MOD:  NSVD  Kathryn Lozano Kathryn Lozano 06/22/2017, 10:32 PM

## 2017-06-22 NOTE — Anesthesia Preprocedure Evaluation (Signed)
Anesthesia Evaluation  Patient identified by MRN, date of birth, ID band Patient awake    Reviewed: Allergy & Precautions, NPO status , Patient's Chart, lab work & pertinent test results  History of Anesthesia Complications (+) PONV and history of anesthetic complications  Airway Mallampati: III  TM Distance: >3 FB Neck ROM: full    Dental  (+) Teeth Intact   Pulmonary neg pulmonary ROS,           Cardiovascular negative cardio ROS       Neuro/Psych  Headaches, PSYCHIATRIC DISORDERS Anxiety    GI/Hepatic GERD  Medicated,  Endo/Other    Renal/GU Renal disease (kindey stones)     Musculoskeletal   Abdominal   Peds  Hematology   Anesthesia Other Findings   Reproductive/Obstetrics                             Anesthesia Physical Anesthesia Plan  ASA: II  Anesthesia Plan: Epidural   Post-op Pain Management:    Induction:   PONV Risk Score and Plan:   Airway Management Planned:   Additional Equipment:   Intra-op Plan:   Post-operative Plan:   Informed Consent: I have reviewed the patients History and Physical, chart, labs and discussed the procedure including the risks, benefits and alternatives for the proposed anesthesia with the patient or authorized representative who has indicated his/her understanding and acceptance.     Plan Discussed with:   Anesthesia Plan Comments:         Anesthesia Quick Evaluation

## 2017-06-22 NOTE — Progress Notes (Signed)
Kathryn Lozano is a 31 y.o. G2P0010 at [redacted]w[redacted]d by LMP admitted for active labor, rupture of membranes  Subjective: Denies pain with epidural in place  Objective: BP 120/72   Pulse (!) 114   Temp 99.1 F (37.3 C) (Oral)   Resp 16   Ht 5\' 3"  (1.6 m)   Wt 260 lb (117.9 kg)   LMP 09/16/2016 (Exact Date)   SpO2 100%   BMI 46.06 kg/m  No intake/output data recorded. Total I/O In: 1714.2 [I.V.:1664.2; IV Piggyback:50] Out: 1000 [Urine:1000]  FHT:  FHR: 148 bpm, variability: moderate,  accelerations:  Present,  decelerations:  Absent UC:   regular, every 2-3 minutes on 32mu/min of pitocin with 150-160MVU  SVE:   Dilation: 9 Effacement (%): 100 Station: -2 Exam by:: Clydia Llano RNC  Labs: Lab Results  Component Value Date   WBC 8.0 06/22/2017   HGB 12.7 06/22/2017   HCT 39.2 06/22/2017   MCV 82.8 06/22/2017   PLT 123 (L) 06/22/2017    Assessment / Plan: Augmentation of labor, progressing well  Labor: Progressing normally Preeclampsia:  labs stable Fetal Wellbeing:  Category I Pain Control:  Epidural I/D:  n/a Anticipated MOD:  NSVD  Kathryn Lozano N Jream Broyles 06/22/2017, 5:59 PM

## 2017-06-22 NOTE — Progress Notes (Signed)
LABOR NOTE   [name] 31 y.o.GP@ at [redacted]w[redacted]d Active phase labor.  SUBJECTIVE: Comfortable with epidural   OBJECTIVE:  BP 124/67   Pulse 100   Temp 97.9 F (36.6 C) (Oral)   Resp 18   Ht 5\' 3"  (1.6 m)   Wt 260 lb (117.9 kg)   LMP 09/16/2016 (Exact Date)   SpO2 100%   BMI 46.06 kg/m  Total I/O In: 749.5 [I.V.:749.5] Out: -   She has shown cervical change. CERVIX: 7cm :  100%:   -1:   mid position:   soft SVE:   Dilation: 7 Effacement (%): 100 Station: -1 Exam by:: Kaelynne Christley, A. CNM CONTRACTIONS: difficult to monitor due to maternal body habitus. IUPC placed FHR: Fetal heart tracing reviewed. Baseline: 135 bpm, Variability: Good {> 6 bpm), Accelerations: Reactive and Decelerations: Absent Category I   Analgesia: Epidural  Labs: Lab Results  Component Value Date   WBC 8.0 06/22/2017   HGB 12.7 06/22/2017   HCT 39.2 06/22/2017   MCV 82.8 06/22/2017   PLT 123 (L) 06/22/2017    ASSESSMENT: 1) Labor curve reviewed.       Progress: Active phase labor. and Inadequate uterine activity - intensity or frequency.     Membranes: ruptured, meconium light       Active Problems:   Labor and delivery, indication for care   PLAN: IV Pitocin augmentation and placed McClure, CNM  06/22/2017 12:08 PM

## 2017-06-22 NOTE — Anesthesia Procedure Notes (Signed)
Epidural Patient location during procedure: OB Start time: 06/22/2017 8:03 AM End time: 06/22/2017 8:15 AM  Staffing Anesthesiologist: Emmie Niemann, MD Resident/CRNA: Hedda Slade, CRNA Performed: resident/CRNA   Preanesthetic Checklist Completed: patient identified, site marked, surgical consent, pre-op evaluation, timeout performed, IV checked, risks and benefits discussed and monitors and equipment checked  Epidural Patient position: sitting Prep: Betadine Patient monitoring: heart rate, continuous pulse ox and blood pressure Approach: midline Location: L4-L5 Injection technique: LOR saline  Needle:  Needle type: Tuohy  Needle gauge: 17 G Needle length: 9 cm and 9 Needle insertion depth: 9 cm Catheter type: closed end flexible Catheter size: 19 Gauge Catheter at skin depth: 14 cm Test dose: negative and 1.5% lidocaine with Epi 1:200 K  Assessment Events: blood not aspirated, injection not painful, no injection resistance, negative IV test and no paresthesia  Additional Notes Pt. Evaluated and documentation done after procedure finished. Patient identified. Risks/Benefits/Options discussed with patient including but not limited to bleeding, infection, nerve damage, paralysis, failed block, incomplete pain control, headache, blood pressure changes, nausea, vomiting, reactions to medication both or allergic, itching and postpartum back pain. Confirmed with bedside nurse the patient's most recent platelet count. Confirmed with patient that they are not currently taking any anticoagulation, have any bleeding history or any family history of bleeding disorders. Patient expressed understanding and wished to proceed. All questions were answered. Sterile technique was used throughout the entire procedure. Please see nursing notes for vital signs. Test dose was given through epidural catheter and negative prior to continuing to dose epidural or start infusion. Warning signs of high  block given to the patient including shortness of breath, tingling/numbness in hands, complete motor block, or any concerning symptoms with instructions to call for help. Patient was given instructions on fall risk and not to get out of bed. All questions and concerns addressed with instructions to call with any issues or inadequate analgesia.   Patient tolerated the insertion well without immediate complications.Reason for block:procedure for pain

## 2017-06-22 NOTE — H&P (Signed)
History and Physical   HPI  Kathryn Lozano is a 31 y.o. G2P0010 at [redacted]w[redacted]d Estimated Date of Delivery: 06/23/17 who is being admitted for  Srom @ 0410 light meconium with contractions every 2-4 minutes. She endorses good fetal movement.    OB History  Obstetric History   G2   P0   T0   P0   A1   L0    SAB1   TAB0   Ectopic0   Multiple0   Live Births0     # Outcome Date GA Lbr Len/2nd Weight Sex Delivery Anes PTL Lv  2 Current           1 SAB 2017 [redacted]w[redacted]d    SAB         PROBLEM LIST  Pregnancy complications or risks: Patient Active Problem List   Diagnosis Date Noted  . Labor and delivery, indication for care 05/26/2017  . Morbid obesity ([place]) 12/04/2016  . Rubella non-immune status, antepartum 11/05/2016  . Chronic cough 12/28/2012  . Post-nasal drip 12/28/2012  . Anxiety   . Migraines   . ADHD (attention deficit hyperactivity disorder)     Prenatal labs and studies: ABO, Rh: --/--/O POS (12/17 1751) Antibody: NEG (12/17 0258) Rubella: <0.90 (04/26 1537) RPR: Non Reactive (04/26 1537)  HBsAg: Negative (04/26 1537)  HIV:   Non reactive NID:POEUMPNT (11/21 1404)   Past Medical History:  Diagnosis Date  . ADHD (attention deficit hyperactivity disorder)   . Anxiety   . Heartburn   . Migraines   . PONV (postoperative nausea and vomiting)    Pt reports nausea, "sensitive stomach"  . Renal disorder    KIDNEY STONES     Past Surgical History:  Procedure Laterality Date  . CHOLECYSTECTOMY N/A 09/14/2014   Procedure: LAPAROSCOPIC CHOLECYSTECTOMY;  Surgeon: Coralie Keens, MD;  Location: [place];  Service: General;  Laterality: N/A;  . CYSTOSCOPY W/ URETERAL STENT PLACEMENT Left 01/30/2016   Procedure: CYSTOSCOPY WITH STENT REPLACEMENT;  Surgeon: Hollice Espy, MD;  Location: [facility];  Service: Urology;  Laterality: Left;  . CYSTOSCOPY WITH STENT PLACEMENT  01/24/2016   Procedure: CYSTOSCOPY WITH STENT PLACEMENT;  Surgeon: Hollice Espy, MD;  Location: [facility];   Service: Urology;;  . LITHOTRIPSY  2005  . URETEROSCOPY  01/24/2016   Procedure: URETEROSCOPY;  Surgeon: Hollice Espy, MD;  Location: [facility];  Service: Urology;;  . URETEROSCOPY Left 01/30/2016   Procedure: URETEROSCOPY;  Surgeon: Hollice Espy, MD;  Location: [facility];  Service: Urology;  Laterality: Left;  . WISDOM TOOTH EXTRACTION       Medications      Medication List    ASK your doctor about these medications   Breast Pump Misc Dispense one breast pump for patient   HOMEOPATHIC PRODUCTS PO   hydrocortisone 1 % lotion Apply 1 application topically 2 (two) times daily.   hydrOXYzine 25 MG tablet Commonly known as:  ATARAX/VISTARIL Take 1 tablet (25 mg total) by mouth every 6 (six) hours as needed for itching.   montelukast 10 MG tablet Commonly known as:  SINGULAIR TAKE 1 TABLET BY MOUTH ONCE DAILY AS DIRECTED   pantoprazole 40 MG tablet Commonly known as:  PROTONIX Take 1 tablet (40 mg total) by mouth daily.   prenatal multivitamin Tabs tablet        Allergies  Roxicet [oxycodone-acetaminophen]  Review of Systems  Constitutional: negative Eyes: negative Ears, nose, mouth, throat, and face: negative Respiratory: negative Cardiovascular: negative Gastrointestinal: negative Genitourinary:negative Integument/breast: negative Hematologic/lymphatic:  negative Musculoskeletal:negative Neurological: negative Behavioral/Psych: negative Endocrine: negative Allergic/Immunologic: negative   Physical Exam  BP 124/67   Pulse 100   Temp 97.9 F (36.6 C) (Oral)   Resp 18   Ht 5\' 3"  (1.6 m)   Wt 260 lb (117.9 kg)   LMP 09/16/2016 (Exact Date)   SpO2 99%   BMI 46.06 kg/m   Lungs:  Clear bilaterally  Cardio: RRR  Abd: Soft, gravid, NT Presentation: cephalic EXT: No C/C/ 1+ Edema DTRs: 2+ B CERVIX:Per RN exam See Prenatal records for more detailed PE.     FHR:  Baseline: 140 bpm, Variability: Good {> 6 bpm), Accelerations: Reactive and  Decelerations: Absent  Toco: Uterine Contractions: Frequency: Every 2-4 minutes   Test Results  Results for orders placed or performed during the hospital encounter of 06/22/17 (from the past 24 hour(s))  Type and screen [place]     Status: None   Collection Time: 06/22/17  6:38 AM  Result Value Ref Range   ABO/RH(D) O POS    Antibody Screen NEG    Sample Expiration 06/25/2017   CBC     Status: Abnormal   Collection Time: 06/22/17  6:38 AM  Result Value Ref Range   WBC 8.0 3.6 - 11.0 K/uL   RBC 4.73 3.80 - 5.20 MIL/uL   Hemoglobin 12.7 12.0 - 16.0 g/dL   HCT 39.2 35.0 - 47.0 %   MCV 82.8 80.0 - 100.0 fL   MCH 26.9 26.0 - 34.0 pg   MCHC 32.5 32.0 - 36.0 g/dL   RDW 15.0 (H) 11.5 - 14.5 %   Platelets 123 (L) 150 - 440 K/uL   Group B Strep positive  Assessment   G2P0010 at [redacted]w[redacted]d Estimated Date of Delivery: 06/23/17  The fetus is reassuring.   Patient Active Problem List   Diagnosis Date Noted  . Labor and delivery, indication for care 05/26/2017  . Morbid obesity ([place]) 12/04/2016  . Rubella non-immune status, antepartum 11/05/2016  . Chronic cough 12/28/2012  . Post-nasal drip 12/28/2012  . Anxiety   . Migraines   . ADHD (attention deficit hyperactivity disorder)     Plan  1. Admit to L&D :   continue present management 2. EFM:-- Category 1 3. Epidural if desired. Stadol for IV pain until epidural requested. 4. Admission labs  5.Anticipate NSVD  Philip Aspen, CNM  06/22/2017 0800

## 2017-06-23 ENCOUNTER — Other Ambulatory Visit: Payer: Managed Care, Other (non HMO)

## 2017-06-23 ENCOUNTER — Encounter: Payer: Managed Care, Other (non HMO) | Admitting: Obstetrics and Gynecology

## 2017-06-23 ENCOUNTER — Encounter
Admission: EM | Disposition: A | Discharge status: Home / Self Care | Payer: Self-pay | Source: Home / Self Care | Attending: Obstetrics and Gynecology

## 2017-06-23 DIAGNOSIS — O324XX0 Maternal care for high head at term, not applicable or unspecified: Secondary | ICD-10-CM

## 2017-06-23 DIAGNOSIS — Z3A39 39 weeks gestation of pregnancy: Secondary | ICD-10-CM

## 2017-06-23 LAB — RPR: RPR Ser Ql: NONREACTIVE

## 2017-06-23 LAB — CBC
HCT: 35.6 % (ref 35.0–47.0)
Hemoglobin: 11.5 g/dL — ABNORMAL LOW (ref 12.0–16.0)
MCH: 26.5 pg (ref 26.0–34.0)
MCHC: 32.3 g/dL (ref 32.0–36.0)
MCV: 82.2 fL (ref 80.0–100.0)
PLATELETS: 123 10*3/uL — AB (ref 150–440)
RBC: 4.33 MIL/uL (ref 3.80–5.20)
RDW: 15 % — AB (ref 11.5–14.5)
WBC: 15.5 10*3/uL — ABNORMAL HIGH (ref 3.6–11.0)

## 2017-06-23 SURGERY — Surgical Case
Anesthesia: Epidural

## 2017-06-23 MED ORDER — MEPERIDINE HCL 25 MG/ML IJ SOLN
INTRAMUSCULAR | Status: AC
  Administered 2017-06-23: 12.5 mg via INTRAVENOUS
  Filled 2017-06-23: qty 1

## 2017-06-23 MED ORDER — MEASLES, MUMPS & RUBELLA VAC ~~LOC~~ INJ
0.5000 mL | INJECTION | Freq: Once | SUBCUTANEOUS | Status: AC
  Administered 2017-06-24: 0.5 mL via SUBCUTANEOUS
  Filled 2017-06-23: qty 0.5

## 2017-06-23 MED ORDER — MIDAZOLAM HCL 2 MG/2ML IJ SOLN
INTRAMUSCULAR | Status: AC
  Filled 2017-06-23: qty 2

## 2017-06-23 MED ORDER — LIDOCAINE 5 % EX PTCH
MEDICATED_PATCH | CUTANEOUS | Status: DC | PRN
Start: 2017-06-23 — End: 2017-06-23
  Administered 2017-06-23: 1 via TRANSDERMAL

## 2017-06-23 MED ORDER — SIMETHICONE 80 MG PO CHEW: 80.0000 mg | CHEWABLE_TABLET | ORAL | Status: DC | PRN

## 2017-06-23 MED ORDER — FENTANYL CITRATE (PF) 100 MCG/2ML IJ SOLN
INTRAMUSCULAR | Status: AC
  Filled 2017-06-23: qty 2

## 2017-06-23 MED ORDER — PHENYLEPHRINE HCL 10 MG/ML IJ SOLN
INTRAMUSCULAR | Status: DC | PRN
  Administered 2017-06-23 (×4): 100 ug via INTRAVENOUS

## 2017-06-23 MED ORDER — OXYTOCIN 40 UNITS IN LACTATED RINGERS INFUSION - SIMPLE MED
INTRAVENOUS | Status: AC
  Filled 2017-06-23: qty 1000

## 2017-06-23 MED ORDER — SENNOSIDES-DOCUSATE SODIUM 8.6-50 MG PO TABS
2.0000 | ORAL_TABLET | ORAL | Status: DC
  Filled 2017-06-23: qty 2

## 2017-06-23 MED ORDER — PRENATAL MULTIVITAMIN CH
1.0000 | ORAL_TABLET | Freq: Every day | ORAL | Status: DC
  Administered 2017-06-23 – 2017-06-24 (×2): 1 via ORAL
  Filled 2017-06-23 (×2): qty 1

## 2017-06-23 MED ORDER — TETANUS-DIPHTH-ACELL PERTUSSIS 5-2.5-18.5 LF-MCG/0.5 IM SUSP: 0.5000 mL | Freq: Once | INTRAMUSCULAR | Status: DC

## 2017-06-23 MED ORDER — MIDAZOLAM HCL 2 MG/2ML IJ SOLN
INTRAMUSCULAR | Status: DC | PRN
  Administered 2017-06-23 (×2): 1 mg via INTRAVENOUS

## 2017-06-23 MED ORDER — CHLOROPROCAINE HCL (PF) 3 % IJ SOLN
INTRAMUSCULAR | Status: AC
  Filled 2017-06-23: qty 20

## 2017-06-23 MED ORDER — KETOROLAC TROMETHAMINE 30 MG/ML IJ SOLN: 30.0000 mg | Freq: Once | INTRAMUSCULAR | Status: DC | PRN

## 2017-06-23 MED ORDER — PROPOFOL 10 MG/ML IV BOLUS
INTRAVENOUS | Status: AC
  Filled 2017-06-23: qty 20

## 2017-06-23 MED ORDER — SIMETHICONE 80 MG PO CHEW: 80.0000 mg | CHEWABLE_TABLET | ORAL | Status: DC

## 2017-06-23 MED ORDER — LIDOCAINE 5 % EX PTCH
MEDICATED_PATCH | CUTANEOUS | Status: AC
  Filled 2017-06-23: qty 1

## 2017-06-23 MED ORDER — MEPERIDINE HCL 25 MG/ML IJ SOLN
12.5000 mg | INTRAMUSCULAR | Status: DC | PRN
  Administered 2017-06-23 (×6): 12.5 mg via INTRAVENOUS
  Filled 2017-06-23 (×2): qty 1

## 2017-06-23 MED ORDER — COCONUT OIL OIL
1.0000 "application " | TOPICAL_OIL | Status: DC | PRN
  Administered 2017-06-23: 1 via TOPICAL
  Filled 2017-06-23: qty 120

## 2017-06-23 MED ORDER — FERROUS SULFATE 325 (65 FE) MG PO TABS
325.0000 mg | ORAL_TABLET | Freq: Two times a day (BID) | ORAL | Status: DC
  Administered 2017-06-23 – 2017-06-24 (×4): 325 mg via ORAL
  Filled 2017-06-23 (×4): qty 1

## 2017-06-23 MED ORDER — IBUPROFEN 600 MG PO TABS
600.0000 mg | ORAL_TABLET | Freq: Four times a day (QID) | ORAL | Status: DC
  Administered 2017-06-23 – 2017-06-24 (×6): 600 mg via ORAL
  Filled 2017-06-23 (×6): qty 1

## 2017-06-23 MED ORDER — FENTANYL CITRATE (PF) 100 MCG/2ML IJ SOLN
INTRAMUSCULAR | Status: AC
Start: 2017-06-23 — End: ?
  Filled 2017-06-23: qty 2

## 2017-06-23 MED ORDER — HYDROMORPHONE HCL 2 MG PO TABS: 2.0000 mg | ORAL_TABLET | ORAL | Status: DC | PRN

## 2017-06-23 MED ORDER — OXYTOCIN 40 UNITS IN LACTATED RINGERS INFUSION - SIMPLE MED
2.5000 [IU]/h | INTRAVENOUS | Status: DC
  Administered 2017-06-23: 2.5 [IU]/h via INTRAVENOUS
  Filled 2017-06-23: qty 1000

## 2017-06-23 MED ORDER — WITCH HAZEL-GLYCERIN EX PADS: 1.0000 "application " | MEDICATED_PAD | CUTANEOUS | Status: DC | PRN

## 2017-06-23 MED ORDER — LIDOCAINE 5 % EX PTCH: 1.0000 | MEDICATED_PATCH | CUTANEOUS | Status: DC

## 2017-06-23 MED ORDER — SUCCINYLCHOLINE CHLORIDE 20 MG/ML IJ SOLN
INTRAMUSCULAR | Status: AC
  Filled 2017-06-23: qty 1

## 2017-06-23 MED ORDER — SIMETHICONE 80 MG PO CHEW
80.0000 mg | CHEWABLE_TABLET | Freq: Three times a day (TID) | ORAL | Status: DC
  Administered 2017-06-23 – 2017-06-24 (×4): 80 mg via ORAL
  Filled 2017-06-23 (×4): qty 1

## 2017-06-23 MED ORDER — ACETAMINOPHEN 500 MG PO TABS
1000.0000 mg | ORAL_TABLET | Freq: Four times a day (QID) | ORAL | Status: DC
  Administered 2017-06-23 – 2017-06-24 (×6): 1000 mg via ORAL
  Filled 2017-06-23 (×6): qty 2

## 2017-06-23 MED ORDER — KETOROLAC TROMETHAMINE 30 MG/ML IJ SOLN
INTRAMUSCULAR | Status: AC
  Administered 2017-06-23: 30 mg via INTRAVENOUS
  Filled 2017-06-23: qty 1

## 2017-06-23 MED ORDER — FENTANYL CITRATE (PF) 100 MCG/2ML IJ SOLN: 25.0000 ug | INTRAMUSCULAR | Status: DC | PRN

## 2017-06-23 MED ORDER — ONDANSETRON HCL 4 MG/2ML IJ SOLN: 4.0000 mg | Freq: Once | INTRAMUSCULAR | Status: DC | PRN

## 2017-06-23 MED ORDER — DIPHENHYDRAMINE HCL 25 MG PO CAPS: 25.0000 mg | ORAL_CAPSULE | Freq: Four times a day (QID) | ORAL | Status: DC | PRN

## 2017-06-23 MED ORDER — DIBUCAINE 1 % RE OINT: 1.0000 "application " | TOPICAL_OINTMENT | RECTAL | Status: DC | PRN

## 2017-06-23 MED ORDER — LACTATED RINGERS IV SOLN: INTRAVENOUS | Status: DC

## 2017-06-23 MED ORDER — ONDANSETRON HCL 4 MG/2ML IJ SOLN
INTRAMUSCULAR | Status: DC | PRN
  Administered 2017-06-23: 4 mg via INTRAVENOUS

## 2017-06-23 MED ORDER — MENTHOL 3 MG MT LOZG
1.0000 | LOZENGE | OROMUCOSAL | Status: DC | PRN
Start: 2017-06-23 — End: 2017-06-24
  Filled 2017-06-23: qty 9

## 2017-06-23 MED ORDER — KETOROLAC TROMETHAMINE 30 MG/ML IJ SOLN
30.0000 mg | Freq: Once | INTRAMUSCULAR | Status: AC | PRN
  Administered 2017-06-23: 30 mg via INTRAVENOUS

## 2017-06-23 MED ORDER — METHYLERGONOVINE MALEATE 0.2 MG/ML IJ SOLN
INTRAMUSCULAR | Status: AC
  Administered 2017-06-23: 0.2 mg
  Filled 2017-06-23: qty 1

## 2017-06-23 MED ORDER — CEFAZOLIN SODIUM-DEXTROSE 1-4 GM/50ML-% IV SOLN
INTRAVENOUS | Status: AC
  Administered 2017-06-23: 3 g
  Filled 2017-06-23: qty 50

## 2017-06-23 MED ORDER — FENTANYL CITRATE (PF) 100 MCG/2ML IJ SOLN
INTRAMUSCULAR | Status: DC | PRN
  Administered 2017-06-23 (×4): 50 ug via INTRAVENOUS

## 2017-06-23 SURGICAL SUPPLY — 30 items
ADH SKN CLS APL DERMABOND .7 (GAUZE/BANDAGES/DRESSINGS) ×1
APL SKNCLS STERI-STRIP NONHPOA (GAUZE/BANDAGES/DRESSINGS) ×1
BENZOIN TINCTURE PRP APPL 2/3 (GAUZE/BANDAGES/DRESSINGS) ×1 IMPLANT
CANISTER SUCT 3000ML PPV (MISCELLANEOUS) ×2 IMPLANT
CHLORAPREP W/TINT 26ML (MISCELLANEOUS) ×3 IMPLANT
DERMABOND ADVANCED (GAUZE/BANDAGES/DRESSINGS) ×1
DERMABOND ADVANCED .7 DNX12 (GAUZE/BANDAGES/DRESSINGS) IMPLANT
DRSG TELFA 3X8 NADH (GAUZE/BANDAGES/DRESSINGS) ×2 IMPLANT
ELECT REM PT RETURN 9FT ADLT (ELECTROSURGICAL) ×2
ELECTRODE REM PT RTRN 9FT ADLT (ELECTROSURGICAL) ×1 IMPLANT
GAUZE SPONGE 4X4 12PLY STRL (GAUZE/BANDAGES/DRESSINGS) ×2 IMPLANT
GLOVE BIO SURGEON STRL SZ8 (GLOVE) ×5 IMPLANT
GOWN STRL REUS W/ TWL LRG LVL3 (GOWN DISPOSABLE) ×2 IMPLANT
GOWN STRL REUS W/ TWL XL LVL3 (GOWN DISPOSABLE) ×1 IMPLANT
GOWN STRL REUS W/TWL LRG LVL3 (GOWN DISPOSABLE) ×4
GOWN STRL REUS W/TWL XL LVL3 (GOWN DISPOSABLE) ×2
NS IRRIG 1000ML POUR BTL (IV SOLUTION) ×2 IMPLANT
PACK C SECTION AR (MISCELLANEOUS) ×2 IMPLANT
PAD DRESSING TELFA 3X8 NADH (GAUZE/BANDAGES/DRESSINGS) ×1 IMPLANT
PAD OB MATERNITY 4.3X12.25 (PERSONAL CARE ITEMS) ×2 IMPLANT
PAD PREP 24X41 OB/GYN DISP (PERSONAL CARE ITEMS) ×2 IMPLANT
SPONGE XRAY 4X4 16PLY STRL (MISCELLANEOUS) ×1 IMPLANT
STRAP SAFETY BODY (MISCELLANEOUS) ×2 IMPLANT
STRIP CLOSURE SKIN 1/2X4 (GAUZE/BANDAGES/DRESSINGS) ×1 IMPLANT
SUT CHROMIC 1-0 (SUTURE) ×6 IMPLANT
SUT MAXON ABS #0 GS21 30IN (SUTURE) ×4 IMPLANT
SUT PLAIN GUT 0 (SUTURE) ×2 IMPLANT
SUT VIC AB 2-0 CT1 27 (SUTURE) ×4
SUT VIC AB 2-0 CT1 TAPERPNT 27 (SUTURE) ×2 IMPLANT
SUT VIC AB 4-0 KS 27 (SUTURE) ×1 IMPLANT

## 2017-06-23 NOTE — Anesthesia Post-op Follow-up Note (Signed)
Anesthesia QCDR form completed.        

## 2017-06-23 NOTE — Progress Notes (Signed)
PRE-OP NOTE:  1.  Term intrauterine pregnancy, undelivered 2.  Arrest of descent in second stage 3.  Status post trial of forceps  Operative procedure: Primary low cervical transverse cesarean section  Surgeon:Roxan Yamamoto  Assistant: Lorelle Gibbs, certified nurse midwife  Anesthesia: Epidural (Dr. Marlowe Alt)  Indications: 31 year old white female gravida 2 para 0010 at 40.[redacted] weeks gestation, presented in labor.  Patient had Pitocin augmentation of labor for 1 suboptimal contraction pattern.  Patient gradually proceeded to complete/complete/+2 station/OA with 2-1/2 hours of pushing.  Trial of forceps was attempted; Tucker-McLane forceps could not be applied safely adequately; Simpson forceps were able to be applied with ease; bill axis traction bar was used with 2 good contractions and pushing efforts with descent of the fetal head to complete/complete/+3.  Because of slow descent and maximal pressures being utilized, decision was to discontinue the forceps trial and proceed with cesarean section delivery. Estimated fetal weight of infant is 8-1/2 pounds. Maternal pelvis was notable for greater than 90 degree pubic arch, moderate ischial spines, and a flat sacrum.  The dystocia was too great to proceed with more aggressive vaginal delivery attempt.  PRE OP C/S COUNSELING NOTE:  The patient has been counseled regarding indications for Cesarean Section delivery. (See intra partum notes for details). She is understanding of the planned procedure and is aware of and accepting of all surgical risks which include but are not limited to: Bleeding with possible need for blood product transfusions; Infection with possible need for additional antibiotic therapy; Blood Clot Disorders with possible need for blood thinners; Pelvic organ Injury with possible need for repair (Bowel/Bladder/Ureters/Ovaries/Tubes/etc); Anesthesia risks; Fetal Injury; and additional Post Op complications including incision  dehiscence and possible future pregnancy placenta implantation issues. All questions have been answered. Informed consent is given.  Lutricia Widjaja A.Zipporah Plants, MD, FACOG [facility]

## 2017-06-23 NOTE — Progress Notes (Signed)
Subjective: Post Op Day Day of Surgery: Cesarean Delivery Patient reports incisional pain, tolerating PO and Breastfeeding.    Objective: Vital signs in last 24 hours: Temp:  [97.6 F (36.4 C)-99.1 F (37.3 C)] 98.4 F (36.9 C) (12/18 0812) Pulse Rate:  [79-116] 79 (12/18 0812) Resp:  [7-28] 18 (12/18 0812) BP: (100-152)/(54-96) 105/54 (12/18 0812) SpO2:  [95 %-100 %] 97 % (12/18 7824)  Physical Exam:  General: alert, cooperative and appears stated age Lochia: appropriate Uterine Fundus: firm Incision: dressing dry and intact DVT Evaluation: No evidence of DVT seen on physical exam. Negative Homan's sign.  Recent Labs    06/22/17 0638 06/23/17 0506  HGB 12.7 11.5*  HCT 39.2 35.6     Assessment/Plan: Status post Cesarean section. Doing well postoperatively.  Continue current care.   N  06/23/2017, 9:07 AM

## 2017-06-23 NOTE — Transfer of Care (Signed)
Immediate Anesthesia Transfer of Care Note  Patient: Kathryn Lozano  Procedure(s) Performed: CESAREAN SECTION (N/A )  Patient Location: PACU  Anesthesia Type:Epidural  Level of Consciousness: awake, alert , oriented and patient cooperative  Airway & Oxygen Therapy: Patient Spontanous Breathing  Post-op Assessment: Report given to RN and Post -op Vital signs reviewed and stable  Post vital signs: Reviewed and stable  Last Vitals:  Vitals:   06/22/17 2201 06/22/17 2327  BP: 130/65 122/66  Pulse: (!) 106 (!) 116  Resp:    Temp:    SpO2:      Last Pain:  Vitals:   06/22/17 2157  TempSrc: Oral  PainSc:       Patients Stated Pain Goal: 0 (35/32/99 2426)  Complications: No apparent anesthesia complications

## 2017-06-23 NOTE — Anesthesia Postprocedure Evaluation (Signed)
Anesthesia Post Note  Patient: Kathryn Lozano  Procedure(s) Performed: CESAREAN SECTION (N/A )  Patient location during evaluation: Mother Baby Anesthesia Type: Epidural Level of consciousness: awake, awake and alert, oriented and patient cooperative Pain management: pain level controlled Vital Signs Assessment: post-procedure vital signs reviewed and stable Respiratory status: spontaneous breathing, nonlabored ventilation and respiratory function stable Cardiovascular status: stable Postop Assessment: no headache and no backache Anesthetic complications: no     Last Vitals:  Vitals:   06/23/17 0507 06/23/17 0607  BP: 135/72 119/64  Pulse: 91 89  Resp: 16 16  Temp: 36.8 C 36.4 C  SpO2: 98% 98%    Last Pain:  Vitals:   06/23/17 0607  TempSrc: Oral  PainSc:                  Ricki Miller

## 2017-06-24 MED ORDER — VITAMIN D3 125 MCG (5000 UT) PO CAPS: 1.0000 | ORAL_CAPSULE | Freq: Every day | ORAL | 2 refills | Status: DC

## 2017-06-24 MED ORDER — FUSION PLUS PO CAPS: 1.0000 | ORAL_CAPSULE | Freq: Every day | ORAL | 1 refills | Status: DC

## 2017-06-24 MED ORDER — IBUPROFEN 600 MG PO TABS: 600.0000 mg | ORAL_TABLET | Freq: Four times a day (QID) | ORAL | 0 refills | Status: DC

## 2017-06-24 NOTE — Progress Notes (Signed)
Patient discharged home with infant. Discharge instructions, prescriptions and follow up appointment given to and reviewed with patient. Patient verbalized understanding. Patient wheeled out with infant by nurse tech.

## 2017-06-24 NOTE — Lactation Note (Signed)
This note was copied from a baby's chart. Lactation Consultation Note  Patient Name: Kathryn Lozano [id number] Date: 06/24/2017   Mom states feedings have gone "much better" since getting NS yesterday and that she even got him to nurse without it a few times. She forgot to put on breast shells until today. I gave her a second 20 mm NS to take home fo rprn use. I encouraged her to call if F/U LC appt desired/needed. She does have Spectra pump at home as well for prn use. She has LC and [employer] contact info  Maternal Data    Feeding Feeding Type: Breast Fed Length of feed: 20 min  LATCH Score                   Interventions    Lactation Tools Discussed/Used     Consult Status      Roque Cash 06/24/2017, 5:49 PM

## 2017-06-24 NOTE — Discharge Summary (Signed)
Physician Obstetric Discharge Summary  Patient ID: Kathryn Lozano MRN: [id number] DOB/AGE: 1986/03/29 31 y.o.  Reason for Admission: onset of labor and rupture of membranes Prenatal Procedures: ultrasound Intrapartum Procedures: cesarean: low cervical, transverse and GBS prophylaxis Postpartum Procedures: none Complications-Operative and Postpartum: none  Delivery Note At 1:19 AM a viable and healthy female was delivered via C-Section, Low Vertical (Presentation: ;  ).  APGAR: 8, 9; weight 8 lb 5 oz (3770 g).     .    H/H:  Lab Results  Component Value Date/Time   HGB 11.5 (L) 06/23/2017 05:06 AM   HGB 12.1 04/21/2017 03:20 PM   HCT 35.6 06/23/2017 05:06 AM   HCT 36.9 04/21/2017 03:20 PM    Brief Hospital Course: Kathryn Lozano is a [id number] who underwent cesarean section on 06/23/2017.  Patient had an uncomplicated surgery; for further details of this surgery, please refer to the operative note.  Patient had an uncomplicated postpartum course.  By time of discharge on POD#2/PPD#2, her pain was controlled on oral pain medications; she had appropriate lochia and was ambulating, voiding without difficulty, tolerating regular diet and passing flatus.   She was deemed stable for discharge to home.    Discharge Diagnoses: Term Pregnancy-delivered and failure to progress  Discharge Information: Date: 06/24/2017 Activity: pelvic rest Diet: routine Baby feeding: plans to breastfeed Contraception: IUD Medications: PNV, Ibuprofen, Colace and Iron Discharged Condition: good Instructions: refer to practice specific booklet Discharge to: home  Signed: Joylene Igo, CNM 06/24/2017, 9:23 AM

## 2017-06-26 ENCOUNTER — Encounter: Payer: Self-pay | Admitting: Obstetrics and Gynecology

## 2017-06-29 ENCOUNTER — Ambulatory Visit (INDEPENDENT_AMBULATORY_CARE_PROVIDER_SITE_OTHER): Payer: Managed Care, Other (non HMO) | Admitting: Certified Nurse Midwife

## 2017-06-29 ENCOUNTER — Encounter: Payer: Self-pay | Admitting: Certified Nurse Midwife

## 2017-06-29 VITALS — BP 127/79 | HR 93 | Ht 63.0 in | Wt 246.3 lb

## 2017-06-29 DIAGNOSIS — Z5189 Encounter for other specified aftercare: Secondary | ICD-10-CM

## 2017-06-29 NOTE — Progress Notes (Signed)
    OBSTETRICS/GYNECOLOGY POST-OPERATIVE CLINIC VISIT  Subjective:     Kathryn Lozano is a 31 y.o. female who presents to the clinic 1 weeks status post primary c-section for arrest of descent, status post trial of forceps. Eating a regular diet without difficulty. Bowel movements are normal. Pain is controlled with current analgesics. Medications being used: ibuprofen (OTC).   Reports rash to abdomen and bilateral thighs. Denies difficulty breathing or respiratory distress, chest pain, abdominal pain, excessive vaginal bleeding, and leg pain.   The following portions of the patient's history were reviewed and updated as appropriate: allergies, current medications, past family history, past medical history, past social history, past surgical history and problem list.  Review of Systems Pertinent items are noted in HPI.    Objective:    BP 127/79   Pulse 93   Ht 5\' 3"  (1.6 m)   Wt 246 lb 4.8 oz (111.7 kg)   Breastfeeding? Yes   BMI 43.63 kg/m    General:  alert and no distress  Abdomen: soft, bowel sounds active, non-tender; small red dot like rash noted  Incision:   healing well, no drainage, no erythema, no hernia, no seroma, no swelling, no dehiscence, incision well approximated   Assessment:   Doing well postoperatively.  Breastfeeding Rash   Plan:   1. Continue any current medications. 2. Wound care discussed. 3. Advised benadryl cream or 1% OTC hydrocortisone BID to rash. 4. Activity restrictions: no lifting more than 10 pounds. 5. Anticipated return to work: 6-12 weeks. 6. Follow up: 5 weeks for PPV with Melody or sooner if needed.    Diona Fanti, CNM [facility], [facility]

## 2017-06-29 NOTE — Patient Instructions (Signed)

## 2017-07-28 ENCOUNTER — Ambulatory Visit (INDEPENDENT_AMBULATORY_CARE_PROVIDER_SITE_OTHER): Payer: Managed Care, Other (non HMO) | Admitting: Certified Nurse Midwife

## 2017-07-28 DIAGNOSIS — Z5189 Encounter for other specified aftercare: Secondary | ICD-10-CM

## 2017-07-28 NOTE — Patient Instructions (Signed)

## 2017-07-28 NOTE — Progress Notes (Signed)
Pt is here with c/o left side incision not closing and is sore. Pt is 5 weeks postop.

## 2017-07-30 NOTE — Progress Notes (Signed)
Subjective:    Kathryn Lozano is a 32 y.o. G4P1011 Caucasian female who presents for a postpartum visit. She is 5 weeks postpartum following a primary cesarean section, low transverse incision and cesarean indication: arrest of descent; attempted forceps at 40 gestational weeks. Anesthesia: epidural. I have fully reviewed the prenatal and intrapartum course.   Postpartum course has been uncomplicated. Baby's course has been uncomplicated. Baby is feeding by breast. Bleeding no bleeding. Bowel function is normal. Bladder function is normal.   Patient is not sexually active. Last sexual activity: during pregnancy. Contraception method is abstinence.   The following portions of the patient's history were reviewed and updated as appropriate: allergies, current medications, past medical history, past surgical history and problem list.  Review of Systems  Pertinent items are noted in HPI.   Objective:   General:  alert, cooperative and no distress   Breasts:  deferred, no complaints  Lungs: clear to auscultation bilaterally  Heart:  regular rate and rhythm  Abdomen: soft, non-tender; incision well approximated   Vulva: normal  Vagina: normal vagina  Cervix:  closed  Corpus: Well-involuted  Adnexa:  Non-palpable        Assessment:   Postpartum exam Five (5) wks s/p c-section Breastfeeding Contraception counseling   Plan:   Reviewed red flag symptoms and when to call.   Postpartum depression screen next visit.   Follow up in: 1 week for IUD insertion as previously scheduled or earlier if needed.   Diona Fanti, CNM [facility], [facility]

## 2017-08-03 ENCOUNTER — Encounter: Payer: Managed Care, Other (non HMO) | Admitting: Obstetrics and Gynecology

## 2017-08-04 ENCOUNTER — Encounter: Payer: Self-pay | Admitting: Certified Nurse Midwife

## 2017-08-04 ENCOUNTER — Encounter: Payer: Managed Care, Other (non HMO) | Admitting: Certified Nurse Midwife

## 2017-08-04 ENCOUNTER — Ambulatory Visit (INDEPENDENT_AMBULATORY_CARE_PROVIDER_SITE_OTHER): Payer: Managed Care, Other (non HMO) | Admitting: Certified Nurse Midwife

## 2017-08-04 VITALS — BP 101/69 | HR 84 | Ht 63.0 in | Wt 234.3 lb

## 2017-08-04 DIAGNOSIS — Z538 Procedure and treatment not carried out for other reasons: Secondary | ICD-10-CM

## 2017-08-04 NOTE — Progress Notes (Signed)
Pt is here for an Mirena IUD insert. States she has not had intercourse.Informed consent signed.

## 2017-08-04 NOTE — Patient Instructions (Signed)

## 2017-08-04 NOTE — Progress Notes (Signed)
  Subjective:    Kathryn Lozano is a 32 y.o. female who presents for IUD placement but has changed her mind. She is postpartum and was planning IUD placement and upon start of exam with speculum placement she declines stating is it to uncomfortable. Contraception counseling today.  The patient is not currently sexually active. Pertinent past medical history: none.  Menstrual History: OB History    Gravida Para Term Preterm AB Living   2 1 1   1 1    SAB TAB Ectopic Multiple Live Births   1     0 1       No LMP recorded.   The following portions of the patient's history were reviewed and updated as appropriate: allergies, current medications, past family history, past medical history, past social history, past surgical history and problem list.  Review of Systems Pertinent items are noted in HPI.   Objective:    BP 101/69   Pulse 84   Ht 5\' 3"  (1.6 m)   Wt 234 lb 5 oz (106.3 kg)   Breastfeeding? Yes   BMI 41.51 kg/m   General Appearance:    Alert, cooperative, no distress, appears stated age  Head:    Normocephalic, without obvious abnormality, atraumatic  Neck:   Supple, symmetrical   Abdomen:     Soft, non-tender, bowel sounds active all four quadrants,    no masses, no organomegaly  Extremities:   Extremities normal, atraumatic, no cyanosis or edema  Skin:   Skin color, texture, turgor normal, no rashes or lesions      Assessment:    32 y.o., she is planning on condoms for now and will let us know if she decides on another method condoms, no contraindications.   Plan:  Unsuccessful IUD Placement   All questions answered. follow up for annual or PRN    Philip Aspen, CNM

## 2017-08-11 ENCOUNTER — Encounter: Payer: Self-pay | Admitting: Family Medicine

## 2017-08-11 ENCOUNTER — Ambulatory Visit: Payer: Managed Care, Other (non HMO) | Admitting: Family Medicine

## 2017-08-11 VITALS — BP 120/80 | HR 93 | Temp 98.2°F | Resp 14 | Ht 63.0 in | Wt 238.0 lb

## 2017-08-11 DIAGNOSIS — J029 Acute pharyngitis, unspecified: Secondary | ICD-10-CM | POA: Diagnosis not present

## 2017-08-11 LAB — STREP GROUP A AG, W/REFLEX TO CULT: Streptococcus, Group A Screen (Direct): DETECTED

## 2017-08-11 MED ORDER — AMOXICILLIN 875 MG PO TABS: 875.0000 mg | ORAL_TABLET | Freq: Two times a day (BID) | ORAL | 0 refills | Status: DC

## 2017-08-11 NOTE — Progress Notes (Signed)
Subjective:    Patient ID: Kathryn Lozano, female    DOB: 1986-03-04, 32 y.o.   MRN: [id number]  HPI  Symptoms began 2 days ago.  Symptoms consisted of a sore throat.  She has very little rhinorrhea.  She has very little coughing.  She reports some mild subjective fevers and some mild body aches.  She is concerned because she has a newborn at home.  She also has a history of frequent strep throat.  Sure enough strep screen is positive Past Medical History:  Diagnosis Date  . ADHD (attention deficit hyperactivity disorder)   . Anxiety   . Heartburn   . Migraines   . PONV (postoperative nausea and vomiting)    Pt reports nausea, "sensitive stomach"  . Renal disorder    KIDNEY STONES   Past Surgical History:  Procedure Laterality Date  . CESAREAN SECTION N/A 06/23/2017   Procedure: CESAREAN SECTION;  Surgeon: Brayton Mars, MD;  Location: [facility];  Service: Obstetrics;  Laterality: N/A;  . CHOLECYSTECTOMY N/A 09/14/2014   Procedure: LAPAROSCOPIC CHOLECYSTECTOMY;  Surgeon: Coralie Keens, MD;  Location: [place];  Service: General;  Laterality: N/A;  . CYSTOSCOPY W/ URETERAL STENT PLACEMENT Left 01/30/2016   Procedure: CYSTOSCOPY WITH STENT REPLACEMENT;  Surgeon: Hollice Espy, MD;  Location: [facility];  Service: Urology;  Laterality: Left;  . CYSTOSCOPY WITH STENT PLACEMENT  01/24/2016   Procedure: CYSTOSCOPY WITH STENT PLACEMENT;  Surgeon: Hollice Espy, MD;  Location: [facility];  Service: Urology;;  . LITHOTRIPSY  2005  . URETEROSCOPY  01/24/2016   Procedure: URETEROSCOPY;  Surgeon: Hollice Espy, MD;  Location: [facility];  Service: Urology;;  . URETEROSCOPY Left 01/30/2016   Procedure: URETEROSCOPY;  Surgeon: Hollice Espy, MD;  Location: [facility];  Service: Urology;  Laterality: Left;  . WISDOM TOOTH EXTRACTION     Current Outpatient Medications on File Prior to Visit  Medication Sig Dispense Refill  . Cholecalciferol (VITAMIN D3) 5000 units CAPS Take 1 capsule (5,000  Units total) by mouth daily. 120 capsule 2  . Misc. Devices (BREAST PUMP) MISC Dispense one breast pump for patient 1 each 0  . montelukast (SINGULAIR) 10 MG tablet TAKE 1 TABLET BY MOUTH ONCE DAILY AS DIRECTED 30 tablet 7  . pantoprazole (PROTONIX) 40 MG tablet Take 1 tablet (40 mg total) by mouth daily. 30 tablet 6  . Prenatal Vit-Fe Fumarate-FA (PRENATAL MULTIVITAMIN) TABS tablet Take 1 tablet by mouth daily at 12 noon.     No current facility-administered medications on file prior to visit.    Allergies  Allergen Reactions  . Roxicet [Oxycodone-Acetaminophen] Itching    Pt states that she is itching all over her body.    Social History   Socioeconomic History  . Marital status: Married    Spouse name: Not on file  . Number of children: Not on file  . Years of education: Not on file  . Highest education level: Not on file  Social Needs  . Financial resource strain: Not on file  . Food insecurity - worry: Not on file  . Food insecurity - inability: Not on file  . Transportation needs - medical: Not on file  . Transportation needs - non-medical: Not on file  Occupational History  . Not on file  Tobacco Use  . Smoking status: Never Smoker  . Smokeless tobacco: Never Used  Substance and Sexual Activity  . Alcohol use: No    Comment: Once every 6 months.   . Drug use:  No  . Sexual activity: Yes    Birth control/protection: None  Other Topics Concern  . Not on file  Social History Narrative  . Not on file     Review of Systems  All other systems reviewed and are negative.      Objective:   Physical Exam  Constitutional: She appears well-developed and well-nourished. No distress.  HENT:  Right Ear: External ear normal.  Left Ear: External ear normal.  Nose: Nose normal.  Mouth/Throat: Posterior oropharyngeal erythema present. No oropharyngeal exudate.  Neck: Neck supple.  Cardiovascular: Normal rate, regular rhythm and normal heart sounds.  No murmur  heard. Pulmonary/Chest: Effort normal and breath sounds normal. No respiratory distress. She has no wheezes.  Lymphadenopathy:    She has no cervical adenopathy.  Skin: She is not diaphoretic.  Vitals reviewed.         Assessment & Plan:  Sore throat - Plan: STREP GROUP A AG, W/REFLEX TO CULT  Patient appears to have a very early case strep throat.  Begin amoxicillin 875 mg p.o. twice daily for 10 days.  Contact precautions were given regarding her newborn.  Her husband has similar symptoms.  Therefore I will treat him empirically for strep throat with amoxicillin as well.  He is also requesting a cough medication so we will give him Hycodan

## 2017-10-08 ENCOUNTER — Telehealth: Payer: Self-pay | Admitting: Certified Nurse Midwife

## 2017-10-08 ENCOUNTER — Telehealth: Payer: Self-pay

## 2017-10-08 NOTE — Telephone Encounter (Signed)
Message left for pt- yes she may have Hep B inj. Per provider

## 2017-10-08 NOTE — Telephone Encounter (Signed)
The patient called and stated that she would like to ask a nurse if it is safe for her to get a hepatis B injection while she is breast feeding. Please advise.

## 2017-10-08 NOTE — Telephone Encounter (Signed)
Patient may get Hep B vaccine while breastfeeding. Thanks, JML

## 2017-10-21 ENCOUNTER — Encounter: Payer: Self-pay | Admitting: Family Medicine

## 2017-10-22 ENCOUNTER — Other Ambulatory Visit: Payer: Self-pay

## 2017-10-22 ENCOUNTER — Encounter: Payer: Self-pay | Admitting: Family Medicine

## 2017-10-22 ENCOUNTER — Ambulatory Visit: Payer: Managed Care, Other (non HMO) | Admitting: Family Medicine

## 2017-10-22 VITALS — BP 118/62 | HR 86 | Temp 98.2°F | Resp 14 | Ht 63.0 in | Wt 238.0 lb

## 2017-10-22 DIAGNOSIS — L600 Ingrowing nail: Secondary | ICD-10-CM | POA: Diagnosis not present

## 2017-10-22 DIAGNOSIS — J029 Acute pharyngitis, unspecified: Secondary | ICD-10-CM

## 2017-10-22 MED ORDER — AMOXICILLIN-POT CLAVULANATE 875-125 MG PO TABS: 1.0000 | ORAL_TABLET | Freq: Two times a day (BID) | ORAL | 0 refills | Status: DC

## 2017-10-22 NOTE — Patient Instructions (Addendum)
Call HR about carrying over your insurance ENT Referral in a few months  [email or web address] F/U as needed

## 2017-10-22 NOTE — Progress Notes (Signed)
   Subjective:    Patient ID: Kathryn Lozano, female    DOB: [birth year], 32 y.o.   MRN: [id number]  Patient presents for Illness (sore throat, swollen glands) and Toe Pain (possible ingrown toenail )  Sore throat since Tuesday night, no fever, or chills, has recurrent problems with pharyngitis/tonsillitis has been on multiple rounds of antibiotics through the years. Had Strep a lot as a child., using flonase for allergies drainage after seen by UC, has some nasal drainage. Has seen allergist told no allergies to even pollen/ragweed  Has fungal infection in left great toenail, unable to take anti-fungal due to breastfeeding. Had partial nail removal for ingrown as well  wants to recheck, it gets sore at time      Review Of System  GEN- denies fatigue, fever, weight loss,weakness, recent illness HEENT- denies eye drainage, change in vision, nasal discharge, CVS- denies chest pain, palpitations RESP- denies SOB, cough, wheeze ABD- denies N/V, change in stools, abd pain GU- denies dysuria, hematuria, dribbling, incontinence MSK- denies joint pain, muscle aches, injury Neuro- denies headache, dizziness, syncope, seizure activity       Objective:    BP 118/62   Pulse 86   Temp 98.2 F (36.8 C) (Oral)   Resp 14   Ht 5\' 3"  (1.6 m)   Wt 238 lb (108 kg)   SpO2 96%   BMI 42.16 kg/m  GEN- NAD, alert and oriented x3 HEENT- PERRL, EOMI, non injected sclera, pink conjunctiva, MMM, oropharynx injected, enlarged tonstils, no exudate ,nares clear, TM clear bilat no effusion Neck- Supple, shotty LAD  CVS- RRR, no murmur RESP-CTAB EXT- No edema bilat toes no erythema, mild swelling medially at left great toenail, mild TTP, no drainage Pulses- Radial 2+        Assessment & Plan:      Problem List Items Addressed This Visit    None    Visit Diagnoses    Pharyngitis, unspecified etiology    -  Primary   Recurrent tonsillorpharyngitis, treat with augmentin, salt water, motrin, her  insurance expires next week, so we will hold on ENT until she calls [place]   Relevant Orders   STREP GROUP A AG, W/REFLEX TO CULT (Completed)   Ingrown toenail       no sign of infection, discussed ways to cut nails, soak epson salt use dental floss to lift up nail to help pressure spots       Note: This dictation was prepared with Dragon dictation along with smaller phrase technology. Any transcriptional errors that result from this process are unintentional.

## 2017-10-24 ENCOUNTER — Encounter: Payer: Self-pay | Admitting: Family Medicine

## 2017-10-24 LAB — CULTURE, GROUP A STREP
MICRO NUMBER: 90478249
SPECIMEN QUALITY:: ADEQUATE

## 2017-10-24 LAB — STREP GROUP A AG, W/REFLEX TO CULT: Streptococcus, Group A Screen (Direct): NOT DETECTED

## 2017-11-02 ENCOUNTER — Telehealth: Payer: Self-pay | Admitting: Certified Nurse Midwife

## 2017-11-02 NOTE — Telephone Encounter (Signed)
The patient called and stated that she needs her last flu shot faxed to [phone]. Please advise.

## 2017-11-03 ENCOUNTER — Telehealth: Payer: Self-pay

## 2017-11-03 NOTE — Telephone Encounter (Signed)
The fax number pt gave is no longer in service. Copy of immunization mailed to pt.

## 2017-11-12 ENCOUNTER — Other Ambulatory Visit: Payer: Self-pay | Admitting: Family Medicine

## 2017-11-12 ENCOUNTER — Other Ambulatory Visit: Payer: Self-pay | Admitting: Obstetrics and Gynecology

## 2017-11-18 ENCOUNTER — Encounter: Payer: Self-pay | Admitting: Family Medicine

## 2017-12-08 IMAGING — US US EXTREM LOW VENOUS BILAT
1 series · 13 of 24 positions shown · non-contrast
Comparison: None.

CLINICAL DATA: Leg swelling during pregnancy.  Palpitations.



[Series 1: us extrem low venous bilat · 0.08mm/px · 59 acquisitions, 13 frames shown]
[im 1/59]
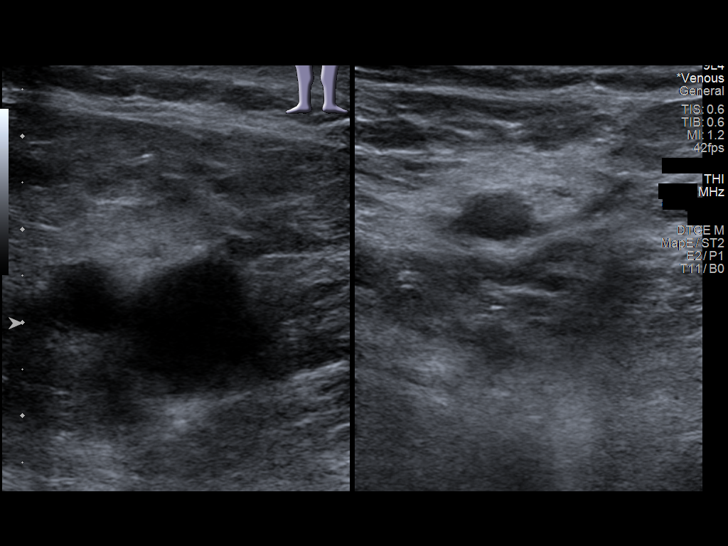
[im 6/59]
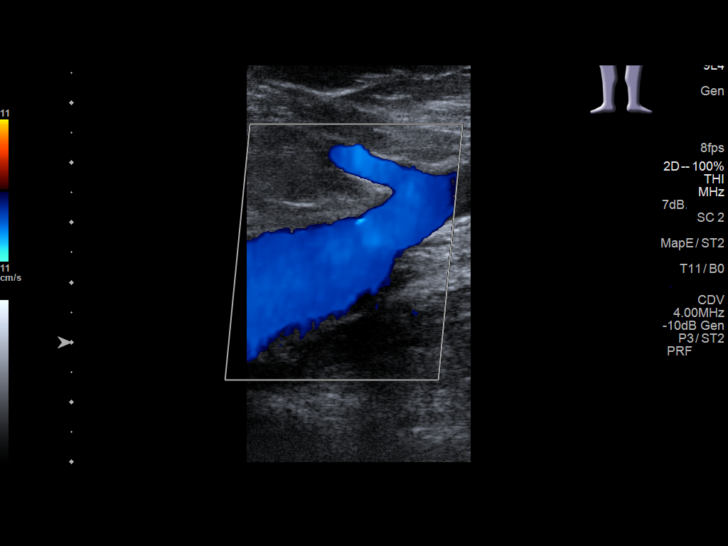
[im 11/59]
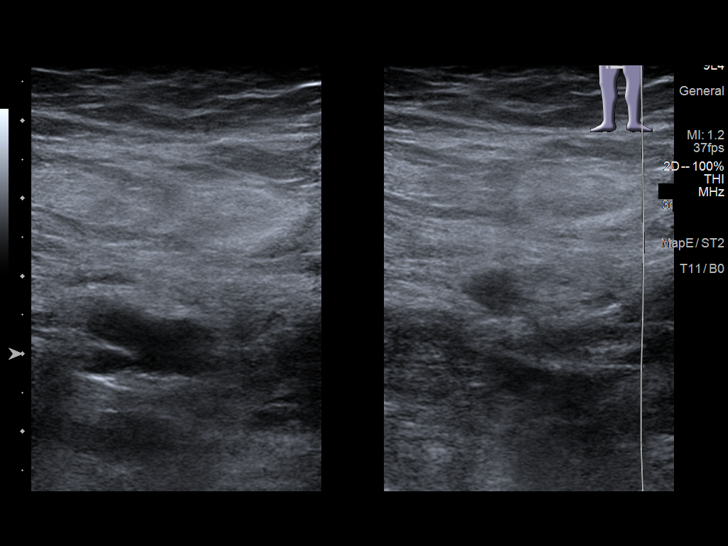
[im 16/59]
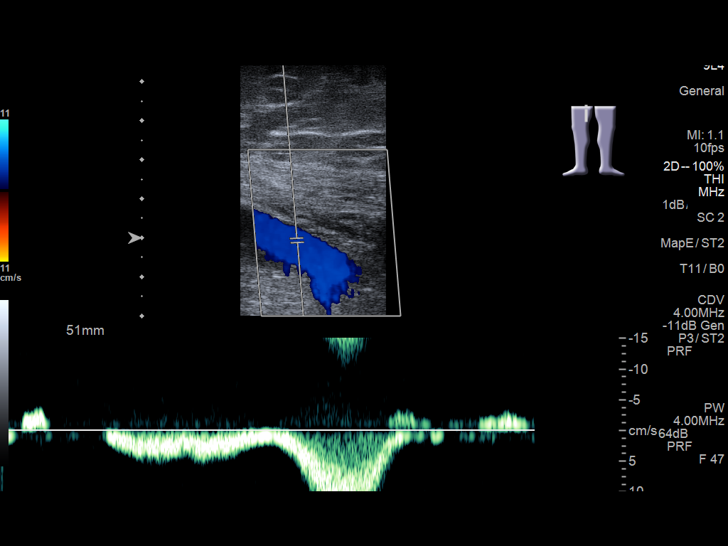
[im 21/59]
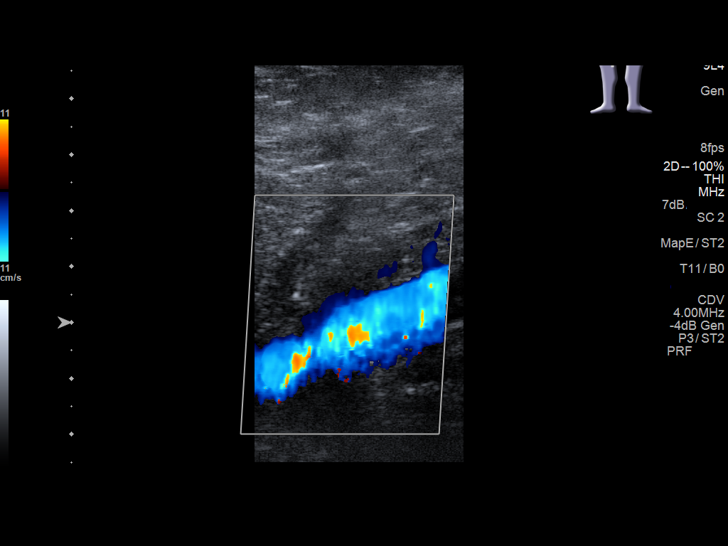
[im 26/59]
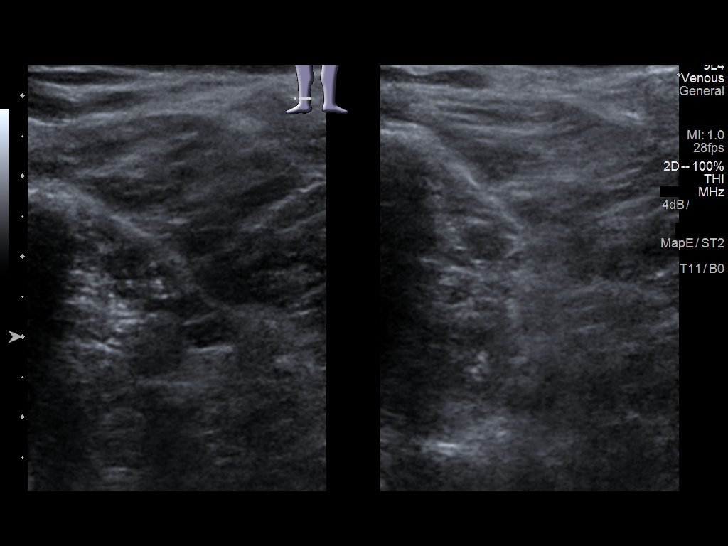
[im 33/59]
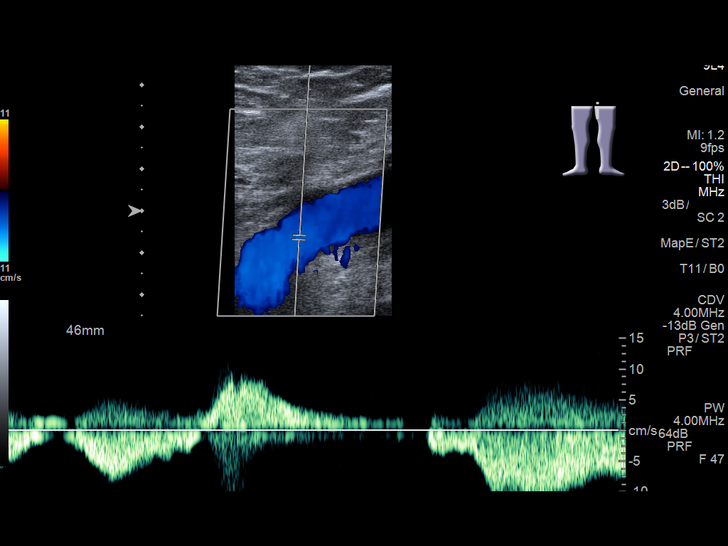
[im 36/59]
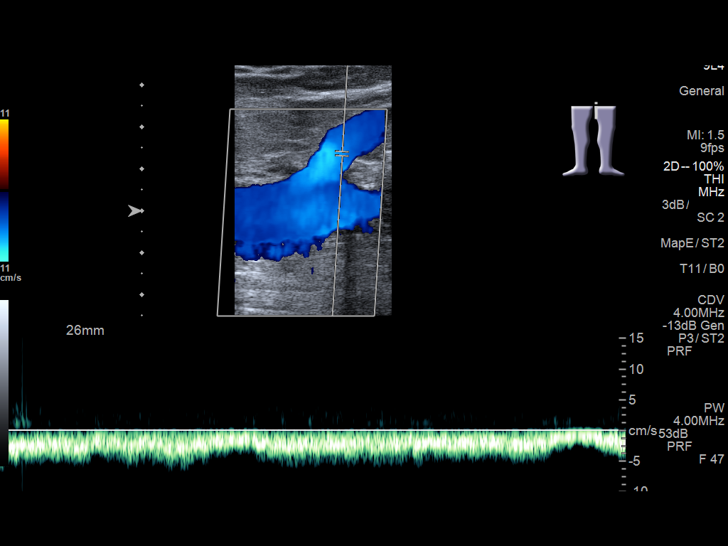
[im 41/59]
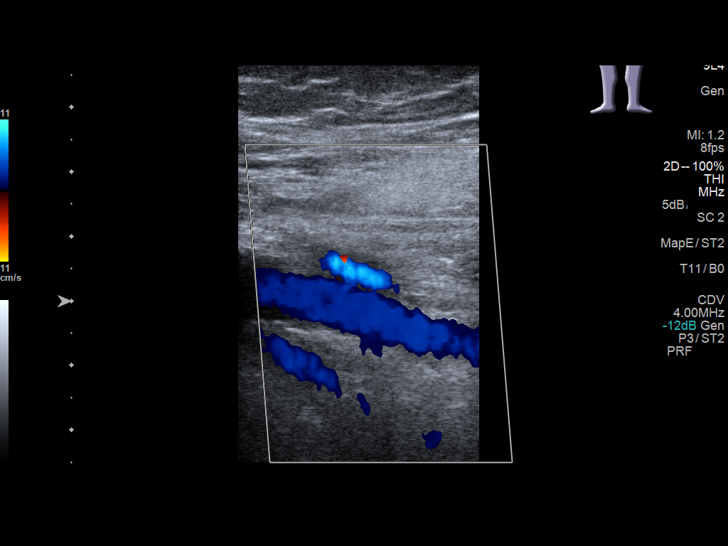
[im 46/59]
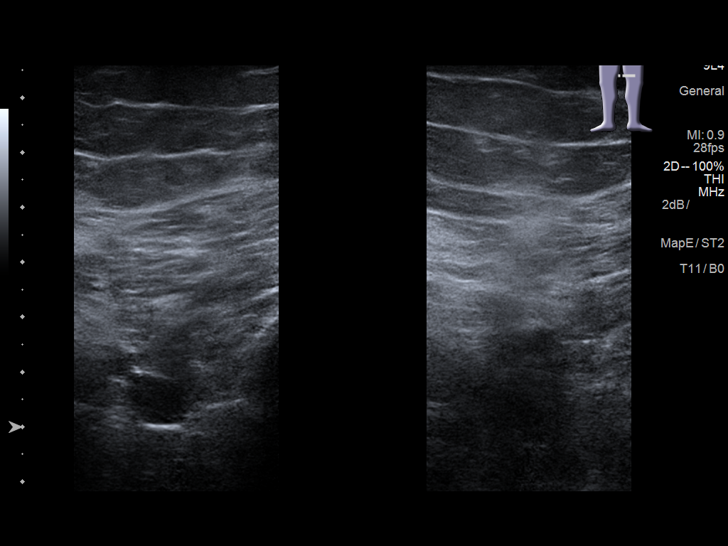
[im 51/59]
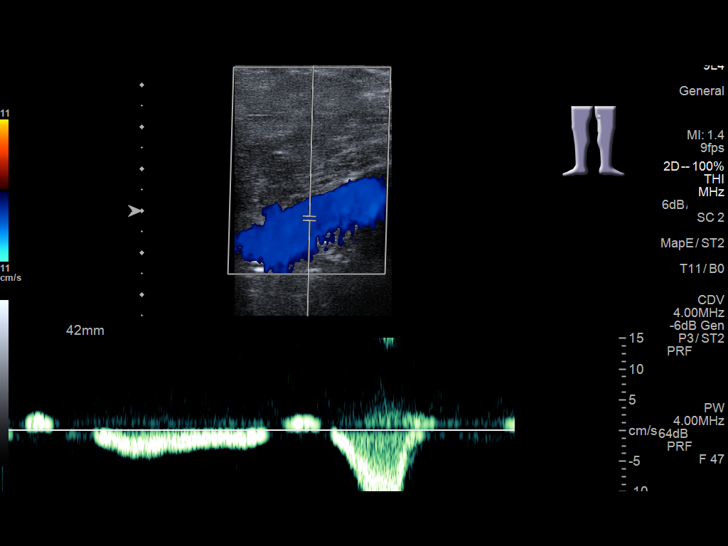
[im 56/59]
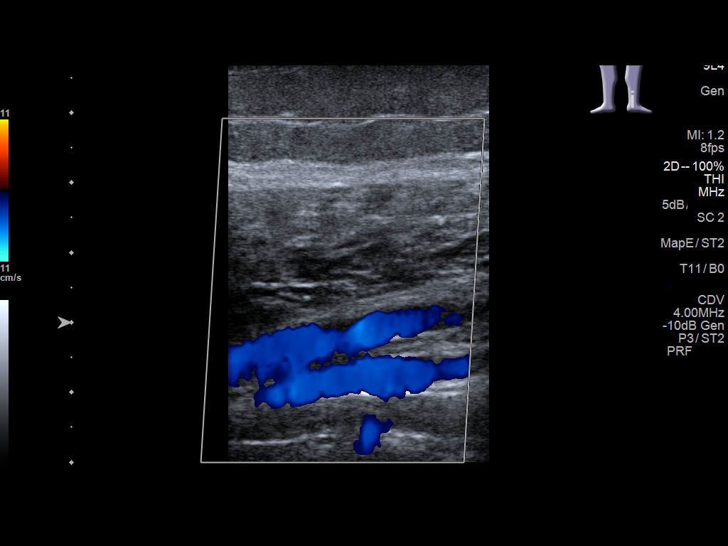
[im 59/59]
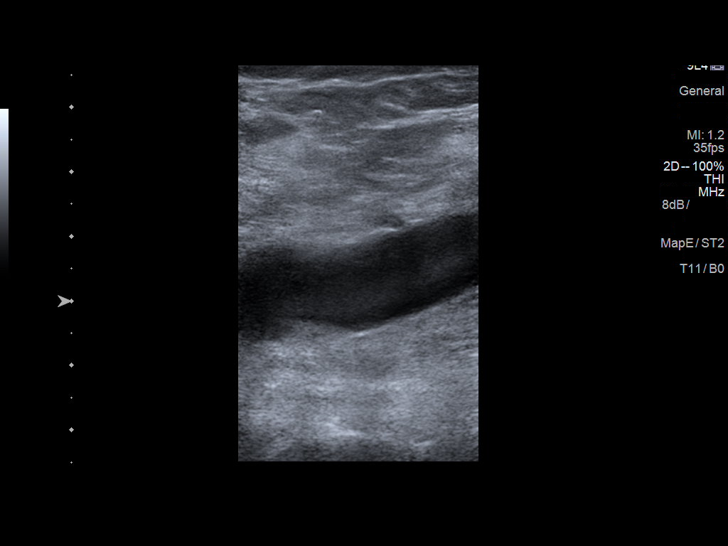

[13 of 24 positions shown; findings below may reference images not displayed]

FINDINGS: RIGHT LOWER EXTREMITY

Common Femoral Vein: No evidence of thrombus. Normal
compressibility, respiratory phasicity and response to augmentation.

Saphenofemoral Junction: No evidence of thrombus.

Profunda Femoral Vein: No evidence of thrombus.

Femoral Vein: No evidence of thrombus.

Popliteal Vein: No evidence of thrombus.

Calf Veins: No evidence of thrombus.

LEFT LOWER EXTREMITY

Common Femoral Vein: Rouleaux is seen but respiratory phasicity is
preserved. Question of this is related to gravid uterus. Normal
compressibility.

Saphenofemoral Junction: No evidence of thrombus.

Profunda Femoral Vein: No evidence of thrombus.

Femoral Vein: No evidence of thrombus.

Popliteal Vein: No evidence of thrombus.

Calf Veins: No evidence of thrombus.
IMPRESSION: No evidence of DVT within either lower extremity.

## 2018-01-08 ENCOUNTER — Ambulatory Visit: Payer: Managed Care, Other (non HMO) | Admitting: Family Medicine

## 2018-01-15 ENCOUNTER — Encounter: Payer: Self-pay | Admitting: Family Medicine

## 2018-01-15 ENCOUNTER — Ambulatory Visit: Payer: Commercial Managed Care - PPO | Admitting: Family Medicine

## 2018-01-15 ENCOUNTER — Other Ambulatory Visit: Payer: Self-pay | Admitting: Family Medicine

## 2018-01-15 VITALS — BP 126/80 | HR 104 | Temp 98.6°F | Resp 16 | Ht 63.0 in | Wt 238.0 lb

## 2018-01-15 DIAGNOSIS — R590 Localized enlarged lymph nodes: Secondary | ICD-10-CM | POA: Diagnosis not present

## 2018-01-15 DIAGNOSIS — F900 Attention-deficit hyperactivity disorder, predominantly inattentive type: Secondary | ICD-10-CM

## 2018-01-15 MED ORDER — LISDEXAMFETAMINE DIMESYLATE 30 MG PO CAPS: 30.0000 mg | ORAL_CAPSULE | Freq: Every day | ORAL | 0 refills | Status: DC

## 2018-01-15 MED ORDER — LISDEXAMFETAMINE DIMESYLATE 30 MG PO CAPS
30.0000 mg | ORAL_CAPSULE | Freq: Every day | ORAL | 0 refills | Status: DC
Start: 2018-01-15 — End: 2018-01-15

## 2018-01-15 NOTE — Progress Notes (Signed)
Subjective:    Patient ID: Kathryn Lozano, female    DOB: 08-Sep-1985, 32 y.o.   MRN: [id number]  HPI Patient had been treated for ADD for several years.  Ultimately she had been treated with Vyvanse.  She did extremely well on this medication without tachycardia or side effects.  However when she became married and started trying to actively conceive she discontinue the medication.  She has been off the medication for more than a year.  She recently returned to a new job.  This job requires extensive periods of focus, multitasking, pain close attention to Monday and details.  It is also a very high stress job.  Because of these factors, she is doing poorly or at least she perceives that she is doing poorly.  She finds her self easily distracted.  She is missing small areas and not paying attention close enough to small details.  She would frequently forgets items and is not completing work in a timely manner.  She is interested in resuming her Vyvanse.  She is also recently noticed a nodule growing just anterior to her left ear.  There is a 5 mm round nodular subcutaneous lesion appears to be the preauricular lymph node.  It is soft.  It is nontender.  She is otherwise totally asymptomatic Past Medical History:  Diagnosis Date  . ADHD (attention deficit hyperactivity disorder)   . Anxiety   . Heartburn   . Migraines   . PONV (postoperative nausea and vomiting)    Pt reports nausea, "sensitive stomach"  . Renal disorder    KIDNEY STONES   Past Surgical History:  Procedure Laterality Date  . CESAREAN SECTION N/A 06/23/2017   Procedure: CESAREAN SECTION;  Surgeon: Brayton Mars, MD;  Location: [facility];  Service: Obstetrics;  Laterality: N/A;  . CHOLECYSTECTOMY N/A 09/14/2014   Procedure: LAPAROSCOPIC CHOLECYSTECTOMY;  Surgeon: Coralie Keens, MD;  Location: [place];  Service: General;  Laterality: N/A;  . CYSTOSCOPY W/ URETERAL STENT PLACEMENT Left 01/30/2016   Procedure: CYSTOSCOPY  WITH STENT REPLACEMENT;  Surgeon: Hollice Espy, MD;  Location: [facility];  Service: Urology;  Laterality: Left;  . CYSTOSCOPY WITH STENT PLACEMENT  01/24/2016   Procedure: CYSTOSCOPY WITH STENT PLACEMENT;  Surgeon: Hollice Espy, MD;  Location: [facility];  Service: Urology;;  . LITHOTRIPSY  2005  . URETEROSCOPY  01/24/2016   Procedure: URETEROSCOPY;  Surgeon: Hollice Espy, MD;  Location: [facility];  Service: Urology;;  . URETEROSCOPY Left 01/30/2016   Procedure: URETEROSCOPY;  Surgeon: Hollice Espy, MD;  Location: [facility];  Service: Urology;  Laterality: Left;  . WISDOM TOOTH EXTRACTION     Current Outpatient Medications on File Prior to Visit  Medication Sig Dispense Refill  . montelukast (SINGULAIR) 10 MG tablet TAKE 1 TABLET BY MOUTH EVERY DAY 30 tablet 1  . pantoprazole (PROTONIX) 40 MG tablet TAKE 1 TABLET BY MOUTH EVERY DAY 30 tablet 6  . Cholecalciferol (VITAMIN D3) 5000 units CAPS Take 1 capsule (5,000 Units total) by mouth daily. (Patient not taking: Reported on 01/15/2018) 120 capsule 2  . Prenatal Vit-Fe Fumarate-FA (PRENATAL MULTIVITAMIN) TABS tablet Take 1 tablet by mouth daily at 12 noon.     No current facility-administered medications on file prior to visit.    Allergies  Allergen Reactions  . Roxicet [Oxycodone-Acetaminophen] Itching    Pt states that she is itching all over her body.    Social History   Socioeconomic History  . Marital status: Married  Spouse name: Not on file  . Number of children: Not on file  . Years of education: Not on file  . Highest education level: Not on file  Occupational History  . Not on file  Social Needs  . Financial resource strain: Not on file  . Food insecurity:    Worry: Not on file    Inability: Not on file  . Transportation needs:    Medical: Not on file    Non-medical: Not on file  Tobacco Use  . Smoking status: Never Smoker  . Smokeless tobacco: Never Used  Substance and Sexual Activity  . Alcohol use: No     Comment: Once every 6 months.   . Drug use: No  . Sexual activity: Yes    Birth control/protection: None  Lifestyle  . Physical activity:    Days per week: Not on file    Minutes per session: Not on file  . Stress: Not on file  Relationships  . Social connections:    Talks on phone: Not on file    Gets together: Not on file    Attends religious service: Not on file    Active member of club or organization: Not on file    Attends meetings of clubs or organizations: Not on file    Relationship status: Not on file  . Intimate partner violence:    Fear of current or ex partner: Not on file    Emotionally abused: Not on file    Physically abused: Not on file    Forced sexual activity: Not on file  Other Topics Concern  . Not on file  Social History Narrative  . Not on file   Family History  Problem Relation Age of Onset  . Alzheimer's disease Paternal Uncle   . Thyroid cancer Paternal Uncle   . Alzheimer's disease Paternal Grandmother   . Kidney cancer Neg Hx   . Bladder Cancer Neg Hx   . Breast cancer Neg Hx   . Rashes / Skin problems Neg Hx   . Colon cancer Neg Hx   . Diabetes Neg Hx       Review of Systems  All other systems reviewed and are negative.      Objective:   Physical Exam  Constitutional: She is oriented to person, place, and time. She appears well-developed and well-nourished. No distress.  HENT:  Head: Normocephalic and atraumatic.    Right Ear: External ear normal.  Left Ear: External ear normal.  Nose: Nose normal.  Mouth/Throat: Oropharynx is clear and moist. No oropharyngeal exudate.  Eyes: Pupils are equal, round, and reactive to light. Conjunctivae and EOM are normal. Right eye exhibits no discharge. Left eye exhibits no discharge. No scleral icterus.  Neck: Normal range of motion. Neck supple. No JVD present. No tracheal deviation present. No thyromegaly present.  Cardiovascular: Normal rate, regular rhythm, normal heart sounds and intact  distal pulses. Exam reveals no gallop and no friction rub.  No murmur heard. Pulmonary/Chest: Effort normal and breath sounds normal. No stridor. No respiratory distress. She has no wheezes. She has no rales. She exhibits no tenderness.  Abdominal: Soft. Bowel sounds are normal. She exhibits no distension and no mass. There is no tenderness. There is no rebound and no guarding.  Musculoskeletal: Normal range of motion. She exhibits no edema or tenderness.  Lymphadenopathy:    She has no cervical adenopathy.  Neurological: She is alert and oriented to person, place, and time. She has normal reflexes.  No cranial nerve deficit. She exhibits normal muscle tone. Coordination normal.  Skin: Skin is warm. No rash noted. She is not diaphoretic. No erythema. No pallor.  Psychiatric: She has a normal mood and affect. Her behavior is normal. Judgment and thought content normal.  Vitals reviewed.         Assessment & Plan:  LAD (lymphadenopathy), preauricular  Attention deficit hyperactivity disorder (ADHD), predominantly inattentive type  The nodule is a benign preauricular lymph node directly in front of her left ear.  I reassured the patient that this is not pathologic.  Simply needs to be monitored for any growth or change.  I anticipate that I will slowly shrink in size over the next 2 to 3 months.  Resume Vyvanse 30 mg a day and reassess in 1 month.  Watch for worsening tachycardia, insomnia, or anxiety on the medication.  Also monitor her blood pressure.  She is not breast-feeding and she has no desire to get pregnant at the current time

## 2018-01-21 ENCOUNTER — Other Ambulatory Visit: Payer: Self-pay | Admitting: Family Medicine

## 2018-01-21 MED ORDER — METHYLPHENIDATE HCL ER 20 MG PO TBCR: 20.0000 mg | EXTENDED_RELEASE_TABLET | Freq: Every day | ORAL | 0 refills | Status: DC

## 2018-01-27 ENCOUNTER — Telehealth: Payer: Self-pay | Admitting: Family Medicine

## 2018-01-27 MED ORDER — OMEPRAZOLE 40 MG PO CPDR: 40.0000 mg | DELAYED_RELEASE_CAPSULE | Freq: Every day | ORAL | 3 refills | Status: DC

## 2018-01-27 NOTE — Telephone Encounter (Signed)
Pt called and does not want to go back on Concerta (methylphenidate) as she has been on this in the past. Informed pt that we will have to do a PA for this and will let her know when/if is it covered.

## 2018-01-29 MED ORDER — LISDEXAMFETAMINE DIMESYLATE 30 MG PO CAPS
30.0000 mg | ORAL_CAPSULE | Freq: Every day | ORAL | 0 refills | Status: DC
Start: 2018-01-29 — End: 2018-03-09

## 2018-01-29 NOTE — Telephone Encounter (Signed)
Can you resend her vyvanse please.    [id number];[id number];Review Type:Prior Auth;Coverage Start Date:12/30/2017;Coverage End Date:01/29/2019; Pt aware

## 2018-01-29 NOTE — Telephone Encounter (Signed)
PA Submitted through [email or web address] and received the following:  [employer] is processing your inquiry and will respond shortly with next steps. You are currently using the fastest method to process this request. Please do not fax or call [employer] to resubmit this request.

## 2018-01-31 IMAGING — US US OB LIMITED
1 series · 14 of 28 positions shown · non-contrast
Comparison: none

CLINICAL DATA: Vaginal discharge in third trimester.

EXAM:
LIMITED OBSTETRIC ULTRASOUND

[Series 1: us ob limited · 14 of 35 slices shown]
[im 2/35]
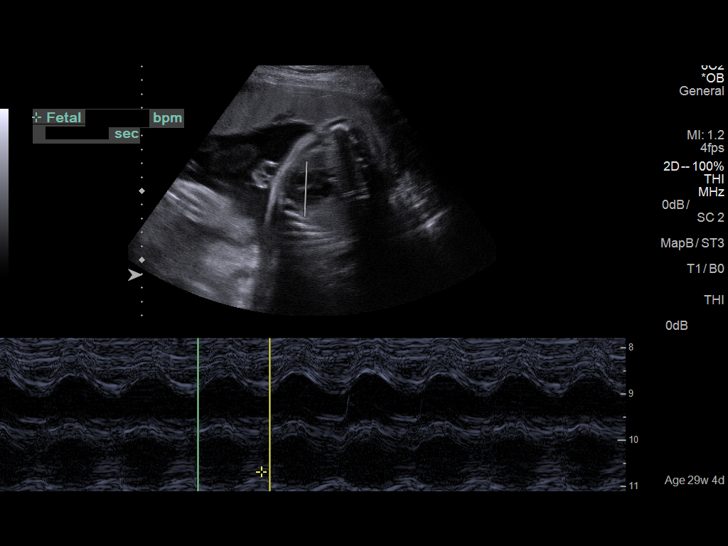
[im 4/35]
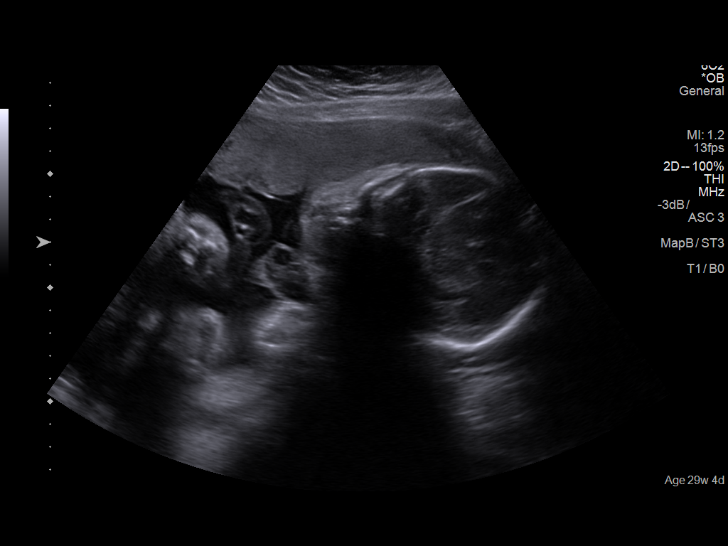
[im 7/35]
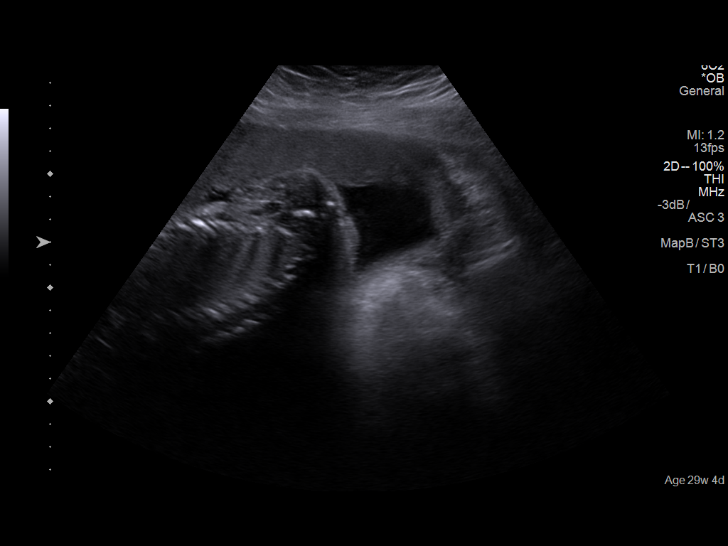
[im 9/35]
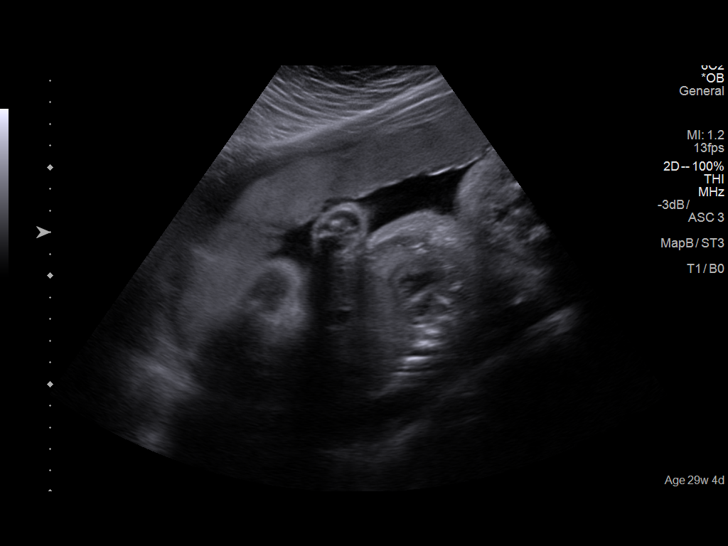
[im 12/35]
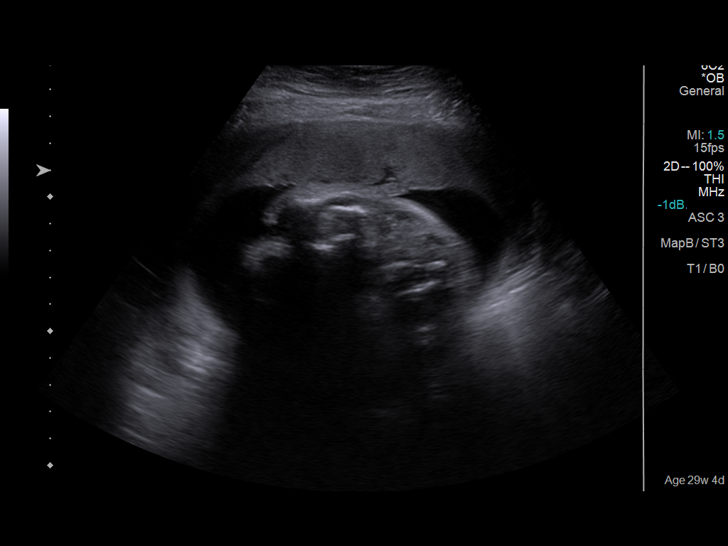
[im 14/35]
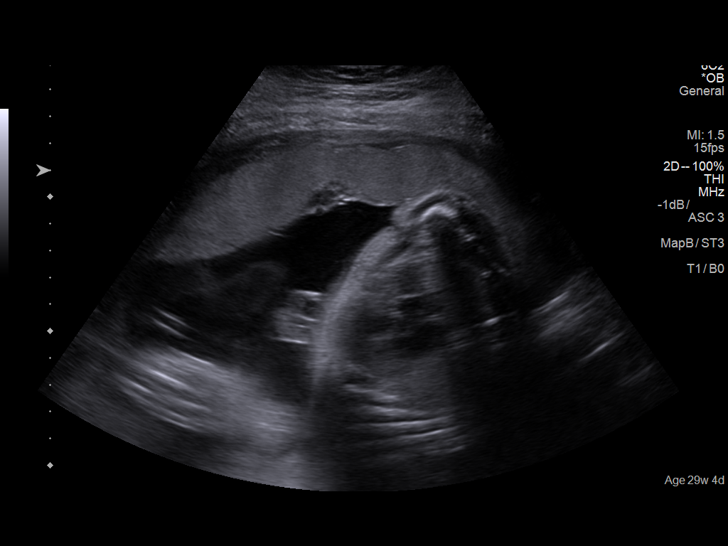
[im 17/35]
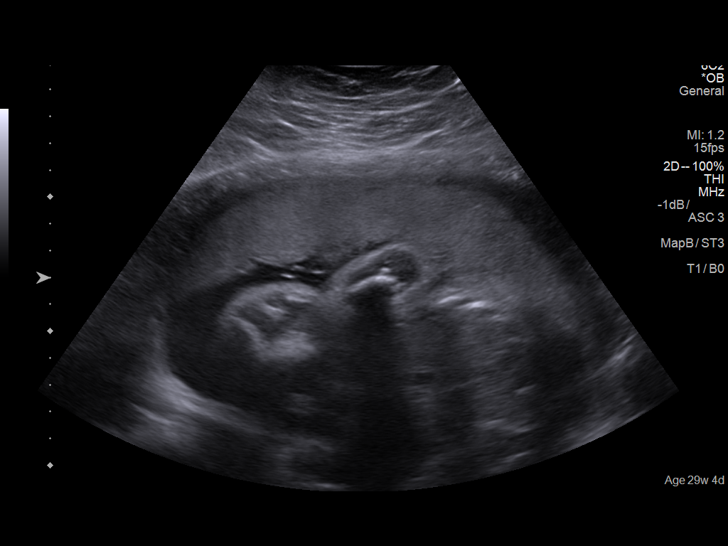
[im 19/35]
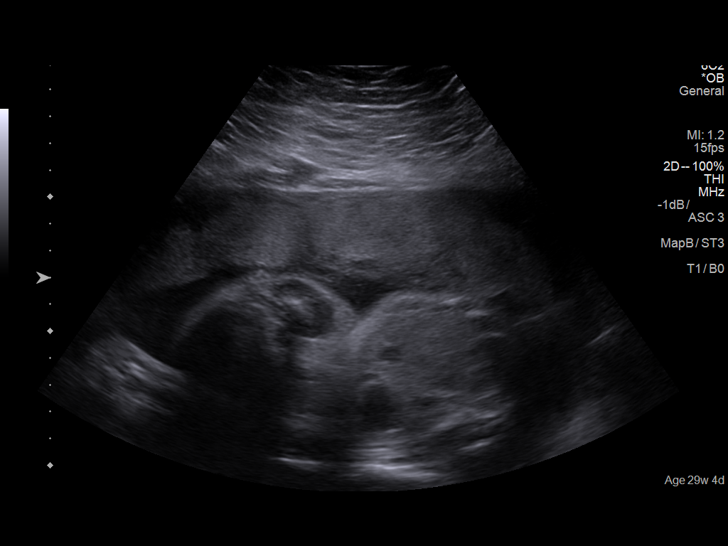
[im 22/35]
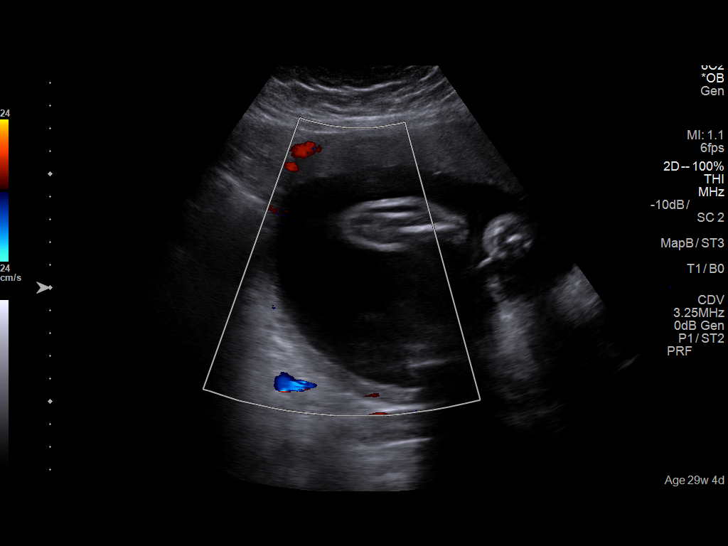
[im 24/35]
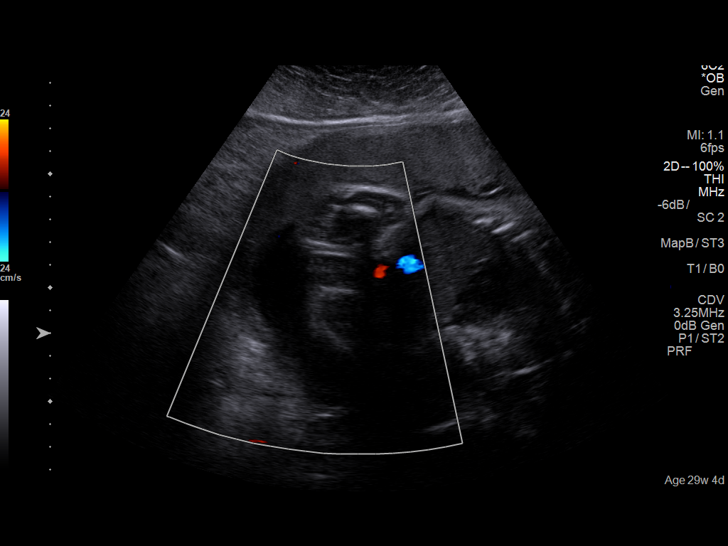
[im 27/35]
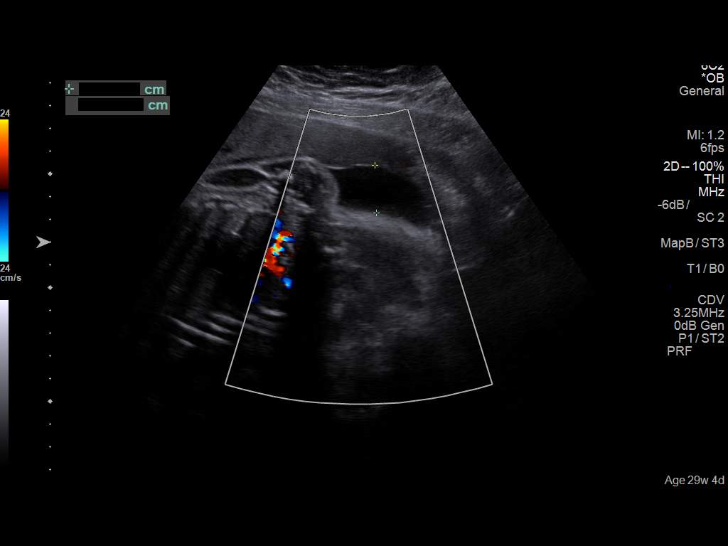
[im 29/35]
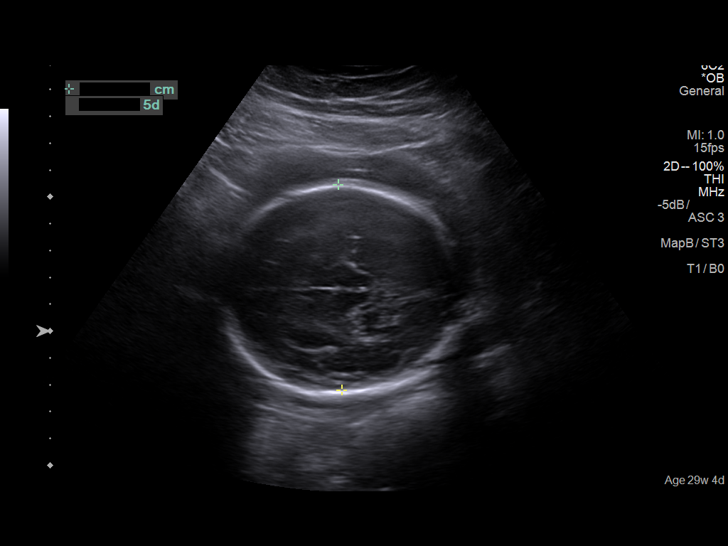
[im 32/35]
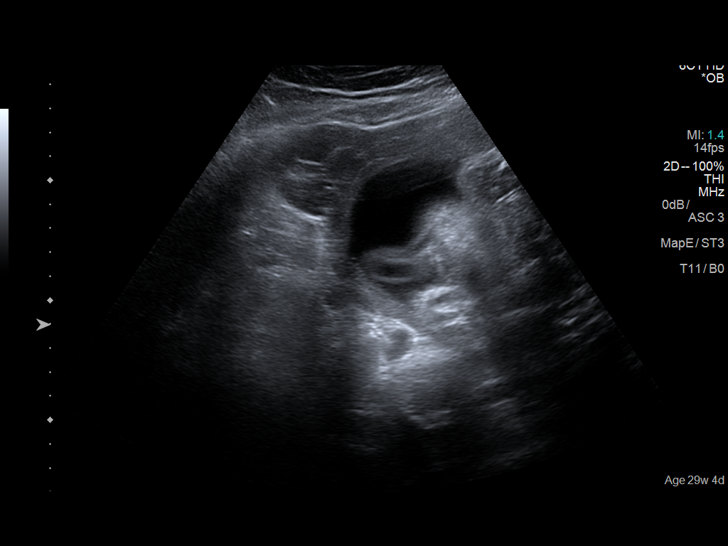
[im 35/35]
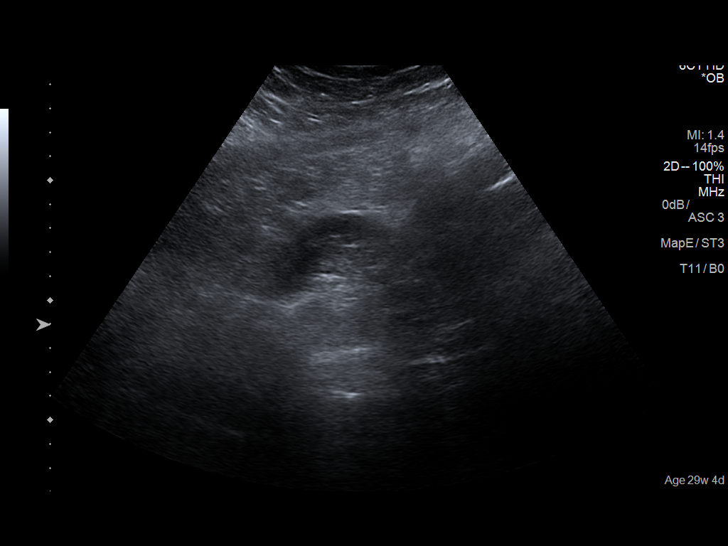

[14 of 28 positions shown; findings below may reference images not displayed]

FINDINGS: Number of Fetuses: 1

Heart Rate:  143 bpm

Movement: Yes

Presentation: Cephalic

Placental Location: Anterior

Previa: No

Amniotic Fluid (Subjective):  Within normal limits.

AFI:  20.5 cm

BPD:  7.68cm 30w  6d

MATERNAL FINDINGS:

Cervix:  Appears closed.

Uterus/Adnexae: No abnormality visualized.
IMPRESSION: Single live intrauterine pregnancy as detailed above.

This exam is performed on an emergent basis and does not
comprehensively evaluate fetal size, dating, or anatomy; follow-up
complete OB US should be considered if further fetal assessment is
warranted.

## 2018-02-08 ENCOUNTER — Encounter: Payer: Self-pay | Admitting: Family Medicine

## 2018-02-08 ENCOUNTER — Ambulatory Visit: Payer: Commercial Managed Care - PPO | Admitting: Family Medicine

## 2018-02-08 VITALS — BP 128/70 | HR 86 | Temp 98.0°F | Resp 16 | Ht 63.0 in | Wt 236.0 lb

## 2018-02-08 DIAGNOSIS — J029 Acute pharyngitis, unspecified: Secondary | ICD-10-CM

## 2018-02-08 MED ORDER — PANTOPRAZOLE SODIUM 40 MG PO TBEC: 40.0000 mg | DELAYED_RELEASE_TABLET | Freq: Every day | ORAL | 3 refills | Status: DC

## 2018-02-08 NOTE — Progress Notes (Signed)
Subjective:    Patient ID: Kathryn Lozano, female    DOB: Nov 19, 1985, 32 y.o.   MRN: [id number]  HPI  01/15/18 Patient had been treated for ADD for several years.  Ultimately she had been treated with Vyvanse.  She did extremely well on this medication without tachycardia or side effects.  However when she became married and started trying to actively conceive she discontinue the medication.  She has been off the medication for more than a year.  She recently returned to a new job.  This job requires extensive periods of focus, multitasking, pain close attention to Monday and details.  It is also a very high stress job.  Because of these factors, she is doing poorly or at least she perceives that she is doing poorly.  She finds her self easily distracted.  She is missing small areas and not paying attention close enough to small details.  She would frequently forgets items and is not completing work in a timely manner.  She is interested in resuming her Vyvanse.  She is also recently noticed a nodule growing just anterior to her left ear.  There is a 5 mm round nodular subcutaneous lesion appears to be the preauricular lymph node.  It is soft.  It is nontender.  She is otherwise totally asymptomatic.  At that time, my plan was: The nodule is a benign preauricular lymph node directly in front of her left ear.  I reassured the patient that this is not pathologic.  Simply needs to be monitored for any growth or change.  I anticipate that I will slowly shrink in size over the next 2 to 3 months.  Resume Vyvanse 30 mg a day and reassess in 1 month.  Watch for worsening tachycardia, insomnia, or anxiety on the medication.  Also monitor her blood pressure.  She is not breast-feeding and she has no desire to get pregnant at the current time  02/08/18 Symptoms began less than a week ago.  Symptoms include sore throat worse on the left-hand side, the feeling of postnasal drip.  Sore throat was so severe that it  woke her up out of sleep last night.  She reports a mild cough which she attributes to acid reflux.  She denies any productive cough.  She does have some mild postnasal drip but no rhinorrhea.  She denies any fevers or chills.  There is no lymphadenopathy appreciable today on exam.  There is no exudate or erythema seen in the posterior oropharynx Past Medical History:  Diagnosis Date  . ADHD (attention deficit hyperactivity disorder)   . Anxiety   . Heartburn   . Migraines   . PONV (postoperative nausea and vomiting)    Pt reports nausea, "sensitive stomach"  . Renal disorder    KIDNEY STONES   Past Surgical History:  Procedure Laterality Date  . CESAREAN SECTION N/A 06/23/2017   Procedure: CESAREAN SECTION;  Surgeon: Brayton Mars, MD;  Location: [facility];  Service: Obstetrics;  Laterality: N/A;  . CHOLECYSTECTOMY N/A 09/14/2014   Procedure: LAPAROSCOPIC CHOLECYSTECTOMY;  Surgeon: Coralie Keens, MD;  Location: [place] [facility];  Service: General;  Laterality: N/A;  . CYSTOSCOPY W/ URETERAL STENT PLACEMENT Left 01/30/2016   Procedure: CYSTOSCOPY WITH STENT REPLACEMENT;  Surgeon: Hollice Espy, MD;  Location: [facility];  Service: Urology;  Laterality: Left;  . CYSTOSCOPY WITH STENT PLACEMENT  01/24/2016   Procedure: CYSTOSCOPY WITH STENT PLACEMENT;  Surgeon: Hollice Espy, MD;  Location: [facility];  Service: Urology;;  .  LITHOTRIPSY  2005  . URETEROSCOPY  01/24/2016   Procedure: URETEROSCOPY;  Surgeon: Hollice Espy, MD;  Location: [facility];  Service: Urology;;  . URETEROSCOPY Left 01/30/2016   Procedure: URETEROSCOPY;  Surgeon: Hollice Espy, MD;  Location: [facility];  Service: Urology;  Laterality: Left;  . WISDOM TOOTH EXTRACTION     Current Outpatient Medications on File Prior to Visit  Medication Sig Dispense Refill  . Cholecalciferol (VITAMIN D3) 5000 units CAPS Take 1 capsule (5,000 Units total) by mouth daily. 120 capsule 2  . lisdexamfetamine (VYVANSE) 30 MG capsule Take 1 capsule  (30 mg total) by mouth daily. 30 capsule 0  . montelukast (SINGULAIR) 10 MG tablet TAKE 1 TABLET BY MOUTH EVERY DAY 30 tablet 1  . omeprazole (PRILOSEC) 40 MG capsule Take 1 capsule (40 mg total) by mouth daily. 90 capsule 3  . Prenatal Vit-Fe Fumarate-FA (PRENATAL MULTIVITAMIN) TABS tablet Take 1 tablet by mouth daily at 12 noon.     No current facility-administered medications on file prior to visit.    Allergies  Allergen Reactions  . Roxicet [Oxycodone-Acetaminophen] Itching    Pt states that she is itching all over her body.    Social History   Socioeconomic History  . Marital status: Married    Spouse name: Not on file  . Number of children: Not on file  . Years of education: Not on file  . Highest education level: Not on file  Occupational History  . Not on file  Social Needs  . Financial resource strain: Not on file  . Food insecurity:    Worry: Not on file    Inability: Not on file  . Transportation needs:    Medical: Not on file    Non-medical: Not on file  Tobacco Use  . Smoking status: Never Smoker  . Smokeless tobacco: Never Used  Substance and Sexual Activity  . Alcohol use: No    Comment: Once every 6 months.   . Drug use: No  . Sexual activity: Yes    Birth control/protection: None  Lifestyle  . Physical activity:    Days per week: Not on file    Minutes per session: Not on file  . Stress: Not on file  Relationships  . Social connections:    Talks on phone: Not on file    Gets together: Not on file    Attends religious service: Not on file    Active member of club or organization: Not on file    Attends meetings of clubs or organizations: Not on file    Relationship status: Not on file  . Intimate partner violence:    Fear of current or ex partner: Not on file    Emotionally abused: Not on file    Physically abused: Not on file    Forced sexual activity: Not on file  Other Topics Concern  . Not on file  Social History Narrative  . Not on  file   Family History  Problem Relation Age of Onset  . Alzheimer's disease Paternal Uncle   . Thyroid cancer Paternal Uncle   . Alzheimer's disease Paternal Grandmother   . Kidney cancer Neg Hx   . Bladder Cancer Neg Hx   . Breast cancer Neg Hx   . Rashes / Skin problems Neg Hx   . Colon cancer Neg Hx   . Diabetes Neg Hx       Review of Systems  All other systems reviewed and are negative.  Objective:   Physical Exam  Constitutional: She appears well-developed and well-nourished. No distress.  HENT:  Head: Normocephalic and atraumatic.  Right Ear: External ear normal.  Left Ear: External ear normal.  Nose: Nose normal.  Mouth/Throat: Oropharynx is clear and moist. No oropharyngeal exudate.  Eyes: Pupils are equal, round, and reactive to light. Conjunctivae and EOM are normal. Right eye exhibits no discharge. Left eye exhibits no discharge. No scleral icterus.  Neck: Normal range of motion. Neck supple. No JVD present. No tracheal deviation present.  Cardiovascular: Normal rate, regular rhythm, normal heart sounds and intact distal pulses. Exam reveals no gallop and no friction rub.  No murmur heard. Pulmonary/Chest: Effort normal and breath sounds normal. No stridor. No respiratory distress. She has no wheezes. She has no rales. She exhibits no tenderness.  Lymphadenopathy:    She has no cervical adenopathy.  Skin: She is not diaphoretic.  Vitals reviewed.         Assessment & Plan:  Sore throat - Plan: STREP GROUP A AG, W/REFLEX TO CULT  Past [facility] criteria, the patient does not have strep throat turned because last time I told her that, her strep screen was positive.  Therefore she is requesting a strep screen today for peace of mind.  I am happy to do this.  However I suspect that this is most likely due to a virus coupled with postnasal drip.  Rapid strep test was negative.  I recommended supportive care.  Symptoms should gradually improve over the next 5 to  6 days

## 2018-02-11 LAB — CULTURE, GROUP A STREP
MICRO NUMBER: 90922605
SOURCE:: 0
SPECIMEN QUALITY:: ADEQUATE

## 2018-02-11 LAB — STREP GROUP A AG, W/REFLEX TO CULT: Streptococcus, Group A Screen (Direct): NOT DETECTED

## 2018-02-19 ENCOUNTER — Other Ambulatory Visit: Payer: Self-pay | Admitting: Family Medicine

## 2018-02-27 ENCOUNTER — Encounter: Payer: Self-pay | Admitting: Family Medicine

## 2018-03-01 MED ORDER — MONTELUKAST SODIUM 10 MG PO TABS: 10.0000 mg | ORAL_TABLET | Freq: Every day | ORAL | 3 refills | Status: DC

## 2018-03-09 ENCOUNTER — Encounter: Payer: Self-pay | Admitting: Family Medicine

## 2018-03-09 MED ORDER — LISDEXAMFETAMINE DIMESYLATE 30 MG PO CAPS
30.0000 mg | ORAL_CAPSULE | Freq: Every day | ORAL | 0 refills | Status: DC
Start: 2018-03-09 — End: 2018-05-03

## 2018-03-09 NOTE — Telephone Encounter (Signed)
Medication sent to incorrect pharmacy.   Ok to re-send to correct pahramcy?

## 2018-03-09 NOTE — Addendum Note (Signed)
Addended by: Sheral Flow on: 03/09/2018 03:56 PM   Modules accepted: Orders

## 2018-04-13 IMAGING — CR DG CHEST 2V
1 series · 2 of 2 positions shown · non-contrast
Comparison: 06/13/2015 .

CLINICAL DATA: Chest pain.

EXAM:
CHEST  2 VIEW

[Series 1: dg chest 2 view · 0.14mm/px · 2 of 2 slices shown]
[im 1/2]
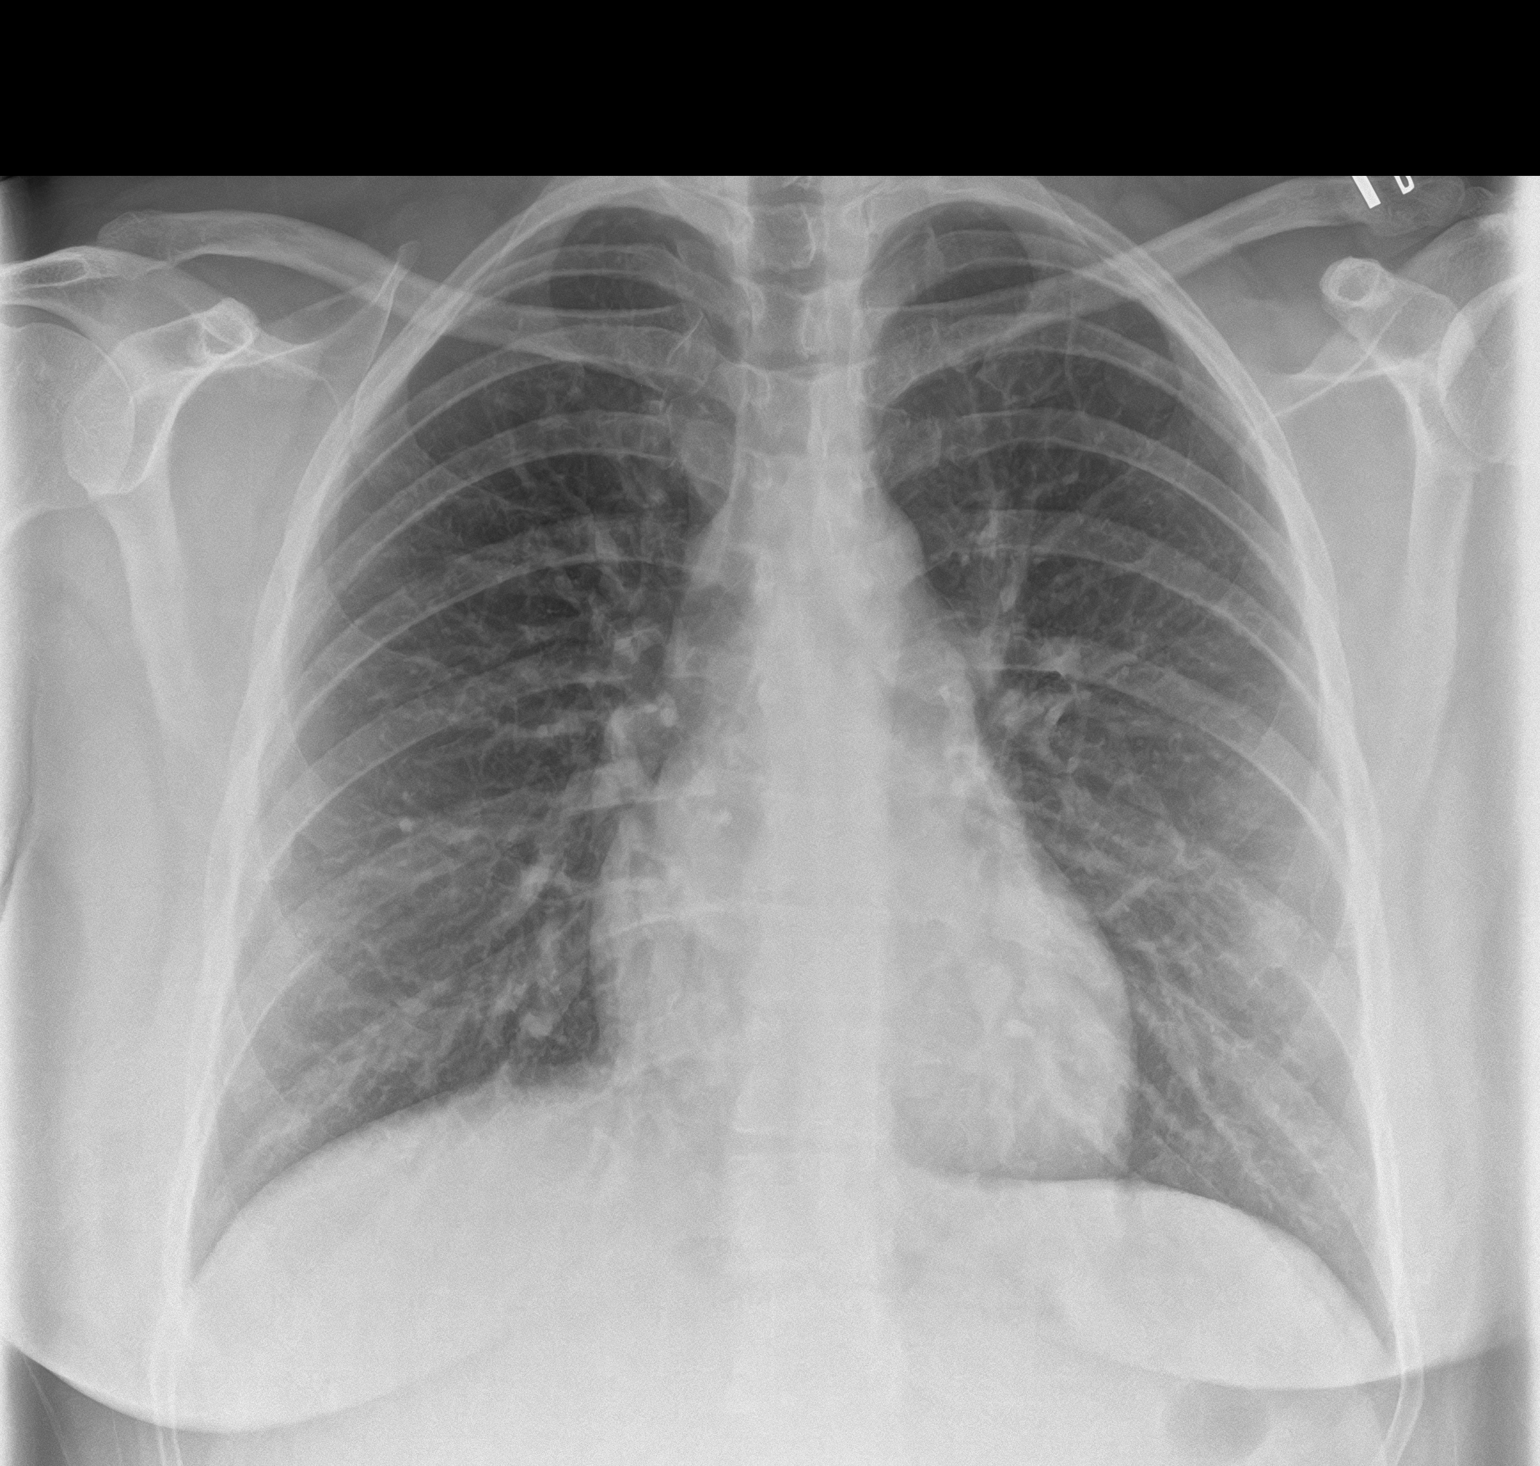
[im 2/2]
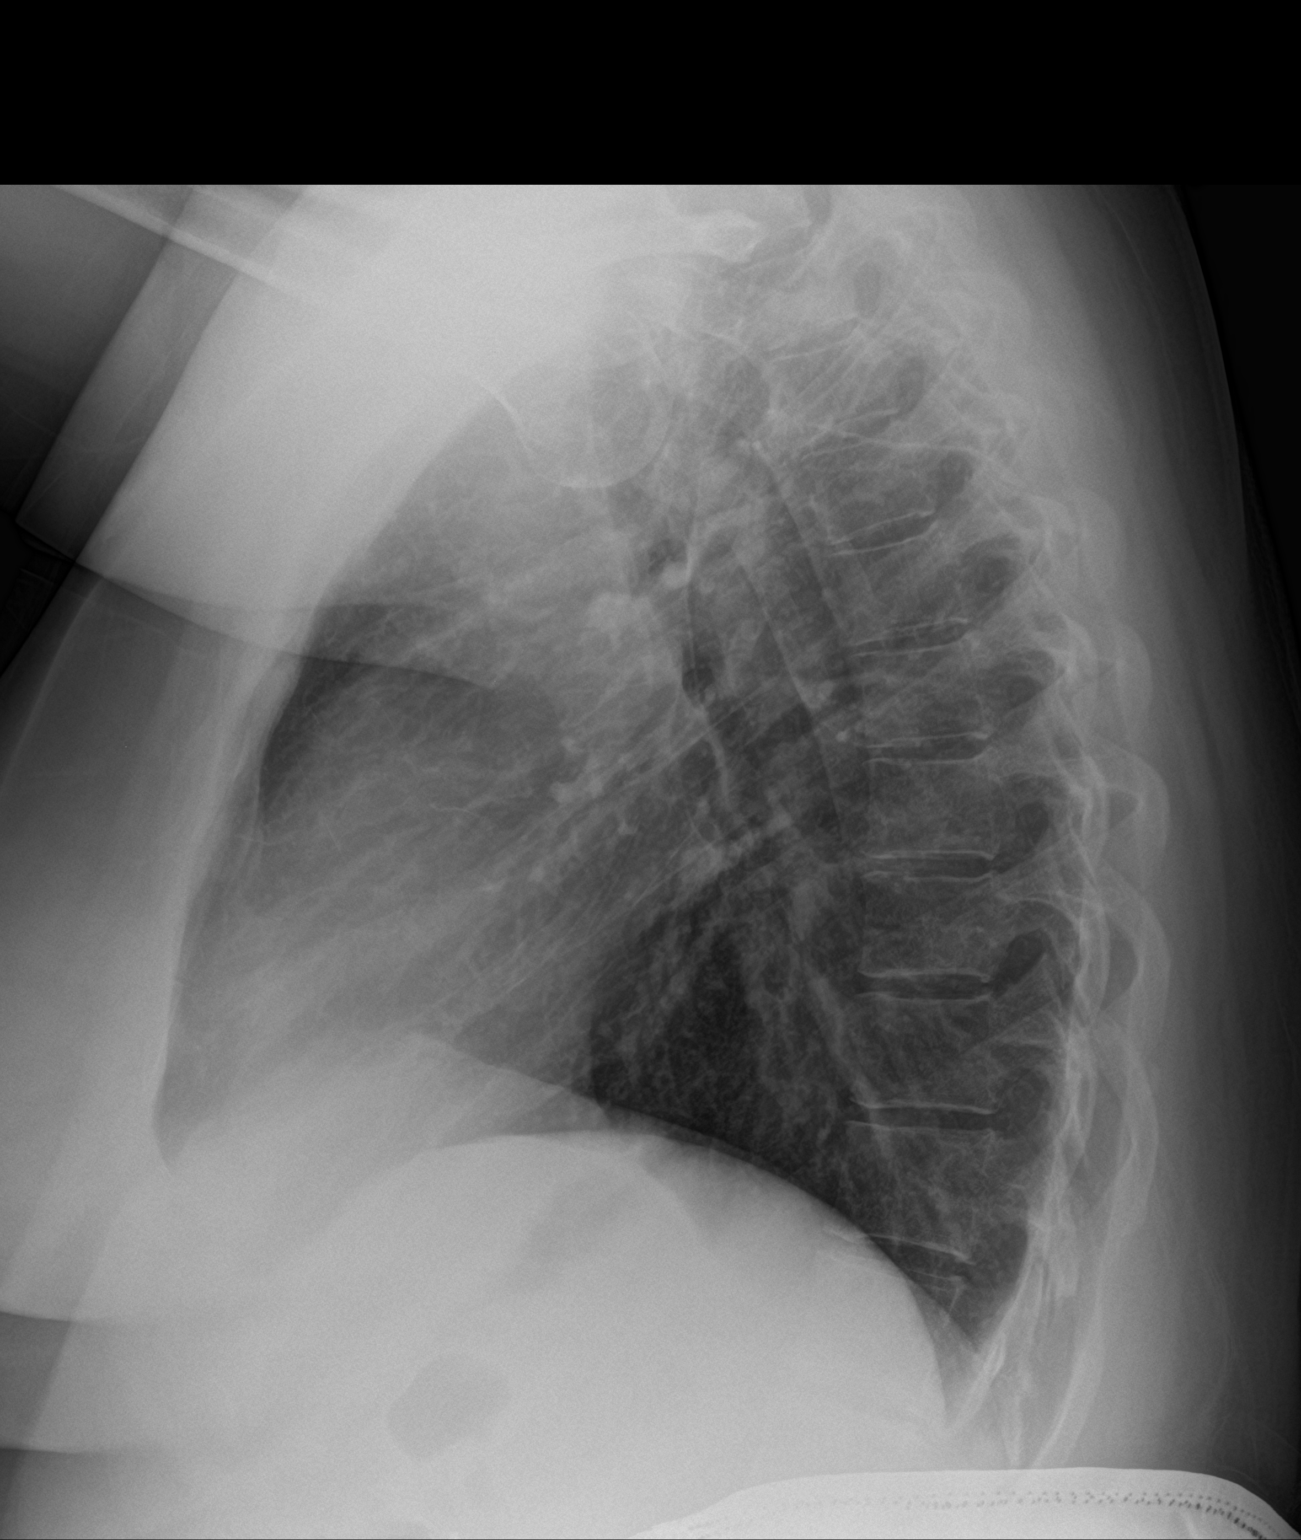

[2 of 2 positions shown; findings below may reference images not displayed]

FINDINGS: Mediastinum hilar structures normal. Lungs are clear. No pleural
effusion or pneumothorax . No acute bony abnormality.
IMPRESSION: No acute cardiopulmonary disease.

## 2018-05-03 ENCOUNTER — Encounter: Payer: Self-pay | Admitting: Family Medicine

## 2018-05-03 MED ORDER — LISDEXAMFETAMINE DIMESYLATE 30 MG PO CAPS
30.0000 mg | ORAL_CAPSULE | Freq: Every day | ORAL | 0 refills | Status: DC
Start: 2018-05-03 — End: 2018-07-16

## 2018-05-03 NOTE — Telephone Encounter (Signed)
Ok to refill??  Last office visit 02/08/2018.  Last refill 03/09/2018.

## 2018-05-07 ENCOUNTER — Encounter: Payer: Self-pay | Admitting: Family Medicine

## 2018-05-07 ENCOUNTER — Other Ambulatory Visit: Payer: Self-pay

## 2018-05-07 ENCOUNTER — Ambulatory Visit (INDEPENDENT_AMBULATORY_CARE_PROVIDER_SITE_OTHER): Payer: Commercial Managed Care - PPO | Admitting: Family Medicine

## 2018-05-07 VITALS — BP 110/62 | HR 68 | Temp 99.5°F | Resp 14 | Ht 63.0 in | Wt 226.0 lb

## 2018-05-07 DIAGNOSIS — J020 Streptococcal pharyngitis: Secondary | ICD-10-CM

## 2018-05-07 LAB — STREP GROUP A AG, W/REFLEX TO CULT: STREPTOCOCCUS, GROUP A SCREEN (DIRECT): DETECTED

## 2018-05-07 MED ORDER — AMOXICILLIN 500 MG PO CAPS: 500.0000 mg | ORAL_CAPSULE | Freq: Two times a day (BID) | ORAL | 0 refills | Status: DC

## 2018-05-07 NOTE — Progress Notes (Signed)
Patient ID: Kathryn Lozano, female    DOB: 25-Sep-1985, 32 y.o.   MRN: [id number]  PCP: Susy Frizzle, MD  Chief Complaint  Patient presents with  . Illness    x3 days- sore throat, fever, fatigue- [facility] and [facility] strep was neg- states that adequate sample was not obtained    Subjective:   Kathryn Lozano is a 32 y.o. female, presents to clinic with CC of severe sore throat for the past 3 to 4 days with fever, fatigue and body aches.  She [facility] and a rapid strep test was taken and was reportedly negative, she has been waiting for culture results but they called her the next day and also told her that that was negative.  She continues to feel very ill.  She reports a past medical history of recurrent strep throat at least 3 or 4 times this year, she denies any recent antibiotic use in the past month.  Is moderate to severe pain is much worse with swallowing, she is tolerating swallowing liquids although she states it is difficult.  She is able to eat food and swallow pills.  She denies any rash, abdominal pain, nausea, vomiting.  No known sick contacts.  She has mild nonproductive cough, described more like throat clearing secondary to sore throat and difficulty swallowing, she denies any nasal symptoms.  She denies trismus, stridor, SOB, difficulty laying flat and breathing.  She was told she had to have multiple strep throats in a short period of time able to get referred to ENT but she suspects that she may qualify for the referral at this point.   Patient Active Problem List   Diagnosis Date Noted  . Morbid obesity ([place]) 12/04/2016  . Rubella non-immune status, antepartum 11/05/2016  . Chronic cough 12/28/2012  . Post-nasal drip 12/28/2012  . Anxiety   . Migraines   . ADHD (attention deficit hyperactivity disorder)      Prior to Admission medications   Medication Sig Start Date End Date Taking? Authorizing Provider    lisdexamfetamine (VYVANSE) 30 MG capsule Take 1 capsule (30 mg total) by mouth daily. 05/03/18  Yes Susy Frizzle, MD  montelukast (SINGULAIR) 10 MG tablet Take 1 tablet (10 mg total) by mouth daily. 03/01/18  Yes Susy Frizzle, MD  pantoprazole (PROTONIX) 40 MG tablet Take 1 tablet (40 mg total) by mouth daily. 02/08/18  Yes Susy Frizzle, MD  amoxicillin (AMOXIL) 500 MG capsule Take 1 capsule (500 mg total) by mouth 2 (two) times daily for 7 days. 05/07/18 05/14/18  Delsa Grana, PA-C     Allergies  Allergen Reactions  . Roxicet [Oxycodone-Acetaminophen] Itching    Pt states that she is itching all over her body.      Family History  Problem Relation Age of Onset  . Alzheimer's disease Paternal Uncle   . Thyroid cancer Paternal Uncle   . Alzheimer's disease Paternal Grandmother   . Kidney cancer Neg Hx   . Bladder Cancer Neg Hx   . Breast cancer Neg Hx   . Rashes / Skin problems Neg Hx   . Colon cancer Neg Hx   . Diabetes Neg Hx      Social History   Socioeconomic History  . Marital status: Married    Spouse name: Not on file  . Number of children: Not on file  . Years of education: Not on file  .  Highest education level: Not on file  Occupational History  . Not on file  Social Needs  . Financial resource strain: Not on file  . Food insecurity:    Worry: Not on file    Inability: Not on file  . Transportation needs:    Medical: Not on file    Non-medical: Not on file  Tobacco Use  . Smoking status: Never Smoker  . Smokeless tobacco: Never Used  Substance and Sexual Activity  . Alcohol use: No    Comment: Once every 6 months.   . Drug use: No  . Sexual activity: Yes    Birth control/protection: None  Lifestyle  . Physical activity:    Days per week: Not on file    Minutes per session: Not on file  . Stress: Not on file  Relationships  . Social connections:    Talks on phone: Not on file    Gets together: Not on file    Attends religious  service: Not on file    Active member of club or organization: Not on file    Attends meetings of clubs or organizations: Not on file    Relationship status: Not on file  . Intimate partner violence:    Fear of current or ex partner: Not on file    Emotionally abused: Not on file    Physically abused: Not on file    Forced sexual activity: Not on file  Other Topics Concern  . Not on file  Social History Narrative  . Not on file     Review of Systems  Constitutional: Positive for fatigue and fever. Negative for diaphoresis and unexpected weight change.  HENT: Negative.  Negative for congestion, ear discharge, ear pain, nosebleeds, postnasal drip, rhinorrhea, sinus pressure and sinus pain.   Eyes: Negative.   Respiratory: Negative.   Cardiovascular: Negative.   Gastrointestinal: Negative.   Endocrine: Negative.   Genitourinary: Negative.   Musculoskeletal: Negative.   Skin: Negative.   Allergic/Immunologic: Negative.   Neurological: Negative.   Hematological: Negative.   Psychiatric/Behavioral: Negative.   All other systems reviewed and are negative.      Objective:    Vitals:   05/07/18 1152  BP: 110/62  Pulse: 68  Resp: 14  Temp: 99.5 F (37.5 C)  TempSrc: Oral  SpO2: 99%  Weight: 226 lb (102.5 kg)  Height: 5\' 3"  (1.6 m)      Physical Exam  Constitutional: She appears well-developed and well-nourished. No distress.  Mildly ill-appearing adult female, appears stated age, no acute distress  HENT:  Head: Normocephalic and atraumatic.  Right Ear: Tympanic membrane, external ear and ear canal normal.  Left Ear: Tympanic membrane, external ear and ear canal normal.  Nose: Nose normal. No mucosal edema or rhinorrhea. Right sinus exhibits no maxillary sinus tenderness and no frontal sinus tenderness. Left sinus exhibits no maxillary sinus tenderness and no frontal sinus tenderness.  Mouth/Throat: Uvula is midline. Mucous membranes are not pale, not dry and not  cyanotic. No oral lesions. No trismus in the jaw. No dental abscesses or uvula swelling. Oropharyngeal exudate, posterior oropharyngeal edema and posterior oropharyngeal erythema present. No tonsillar abscesses. Tonsils are 1+ on the right. Tonsils are 1+ on the left. Tonsillar exudate.  Eyes: Pupils are equal, round, and reactive to light. Conjunctivae and EOM are normal. Right eye exhibits no discharge. Left eye exhibits no discharge.  Neck: Normal range of motion. No tracheal deviation present.  Cardiovascular: Normal rate, regular rhythm, normal heart  sounds and intact distal pulses. Exam reveals no gallop and no friction rub.  No murmur heard. Pulmonary/Chest: Effort normal and breath sounds normal. No stridor. No respiratory distress. She has no wheezes. She has no rales.  Abdominal: Soft. Bowel sounds are normal. She exhibits no distension. There is no tenderness.  Musculoskeletal: Normal range of motion.  Lymphadenopathy:    She has no cervical adenopathy.  Neurological: She is alert. She exhibits normal muscle tone. Coordination normal.  Skin: Skin is warm and dry. No rash noted. She is not diaphoretic.  Psychiatric: She has a normal mood and affect. Her behavior is normal.  Nursing note and vitals reviewed.         Assessment & Plan:      ICD-10-CM   1. Strep pharyngitis J02.0 STREP GROUP A AG, W/REFLEX TO CULT    amoxicillin (AMOXIL) 500 MG capsule    Positive strep test and patient with 4 days of severe sore throat with fever and fatigue.  Airway is patent, mucous membranes moist, she is able to tolerate swallowing fluids, no difficulty breathing, no evidence or concern for peritonsillar abscess.  No antibiotics in the last month, patient states that amoxicillin has been effective in the past, although she has required Augmentin in the past.      Delsa Grana, PA-C 05/07/18 12:09 PM

## 2018-05-07 NOTE — Patient Instructions (Signed)
Strep Throat Strep throat is a bacterial infection of the throat. Your health care provider may call the infection tonsillitis or pharyngitis, depending on whether there is swelling in the tonsils or at the back of the throat. Strep throat is most common during the cold months of the year in children who are 5-32 years of age, but it can happen during any season in people of any age. This infection is spread from person to person (contagious) through coughing, sneezing, or close contact. What are the causes? Strep throat is caused by the bacteria called Streptococcus pyogenes. What increases the risk? This condition is more likely to develop in:  People who spend time in crowded places where the infection can spread easily.  People who have close contact with someone who has strep throat.  What are the signs or symptoms? Symptoms of this condition include:  Fever or chills.  Redness, swelling, or pain in the tonsils or throat.  Pain or difficulty when swallowing.  White or yellow spots on the tonsils or throat.  Swollen, tender glands in the neck or under the jaw.  Red rash all over the body (rare).  How is this diagnosed? This condition is diagnosed by performing a rapid strep test or by taking a swab of your throat (throat culture test). Results from a rapid strep test are usually ready in a few minutes, but throat culture test results are available after one or two days. How is this treated? This condition is treated with antibiotic medicine. Follow these instructions at home: Medicines  Take over-the-counter and prescription medicines only as told by your health care provider.  Take your antibiotic as told by your health care provider. Do not stop taking the antibiotic even if you start to feel better.  Have family members who also have a sore throat or fever tested for strep throat. They may need antibiotics if they have the strep infection. Eating and drinking  Do not  share food, drinking cups, or personal items that could cause the infection to spread to other people.  If swallowing is difficult, try eating soft foods until your sore throat feels better.  Drink enough fluid to keep your urine clear or pale yellow. General instructions  Gargle with a salt-water mixture 3-4 times per day or as needed. To make a salt-water mixture, completely dissolve -1 tsp of salt in 1 cup of warm water.  Make sure that all household members wash their hands well.  Get plenty of rest.  Stay home from school or work until you have been taking antibiotics for 24 hours.  Keep all follow-up visits as told by your health care provider. This is important. Contact a health care provider if:  The glands in your neck continue to get bigger.  You develop a rash, cough, or earache.  You cough up a thick liquid that is green, yellow-brown, or bloody.  You have pain or discomfort that does not get better with medicine.  Your problems seem to be getting worse rather than better.  You have a fever. Get help right away if:  You have new symptoms, such as vomiting, severe headache, stiff or painful neck, chest pain, or shortness of breath.  You have severe throat pain, drooling, or changes in your voice.  You have swelling of the neck, or the skin on the neck becomes red and tender.  You have signs of dehydration, such as fatigue, dry mouth, and decreased urination.  You become increasingly sleepy, or   you cannot wake up completely.  Your joints become red or painful. This information is not intended to replace advice given to you by your health care provider. Make sure you discuss any questions you have with your health care provider. Document Released: 06/20/2000 Document Revised: 02/20/2016 Document Reviewed: 10/16/2014 Elsevier Interactive Patient Education  2018 [employer]  

## 2018-05-12 ENCOUNTER — Telehealth: Payer: Self-pay | Admitting: Family Medicine

## 2018-05-12 ENCOUNTER — Other Ambulatory Visit: Payer: Self-pay | Admitting: Family Medicine

## 2018-05-12 MED ORDER — AMOXICILLIN-POT CLAVULANATE 875-125 MG PO TABS: 1.0000 | ORAL_TABLET | Freq: Two times a day (BID) | ORAL | 0 refills | Status: DC

## 2018-05-12 MED ORDER — PREDNISONE 20 MG PO TABS: ORAL_TABLET | ORAL | 0 refills | Status: DC

## 2018-05-12 MED ORDER — FLUCONAZOLE 150 MG PO TABS: 150.0000 mg | ORAL_TABLET | ORAL | 0 refills | Status: DC | PRN

## 2018-05-12 NOTE — Progress Notes (Signed)
amox d/c and augmentin rx'd, steroids for swelling, diflucan for any secondary yeast infection.  Sandy to contact pt with return call

## 2018-05-12 NOTE — Telephone Encounter (Signed)
Patient aware of providers recommendations via vm 

## 2018-05-12 NOTE — Telephone Encounter (Signed)
I would cancel amoxicillin, start Augmentin, steroids for swelling, and Diflucan to use as needed I sent all his medications to the pharmacy already She was referred to ENT please have her contact us and come in for recheck if she is not better after this we could arrange an urgent ENT evaluation if it is required

## 2018-05-12 NOTE — Telephone Encounter (Signed)
Pt called and states that she is not much better on the antibx and has been sick since last Tuesday and would like to know what else she can do? (DX'd with strep) Does she need to give more time or change antibx?   CB# - [phone]

## 2018-05-13 ENCOUNTER — Encounter: Payer: Self-pay | Admitting: Family Medicine

## 2018-05-13 ENCOUNTER — Ambulatory Visit (INDEPENDENT_AMBULATORY_CARE_PROVIDER_SITE_OTHER): Payer: Commercial Managed Care - PPO | Admitting: Family Medicine

## 2018-05-13 VITALS — BP 102/70 | HR 80 | Temp 98.2°F | Resp 14 | Ht 63.0 in | Wt 224.0 lb

## 2018-05-13 DIAGNOSIS — G629 Polyneuropathy, unspecified: Secondary | ICD-10-CM | POA: Diagnosis not present

## 2018-05-13 DIAGNOSIS — Z0000 Encounter for general adult medical examination without abnormal findings: Secondary | ICD-10-CM

## 2018-05-13 DIAGNOSIS — F900 Attention-deficit hyperactivity disorder, predominantly inattentive type: Secondary | ICD-10-CM | POA: Diagnosis not present

## 2018-05-13 DIAGNOSIS — Z23 Encounter for immunization: Secondary | ICD-10-CM | POA: Diagnosis not present

## 2018-05-13 DIAGNOSIS — E669 Obesity, unspecified: Secondary | ICD-10-CM

## 2018-05-13 MED ORDER — TOPIRAMATE 25 MG PO TABS: 50.0000 mg | ORAL_TABLET | Freq: Two times a day (BID) | ORAL | 2 refills | Status: DC

## 2018-05-13 NOTE — Addendum Note (Signed)
Addended by: Shary Decamp B on: 05/13/2018 04:23 PM   Modules accepted: Orders

## 2018-05-13 NOTE — Progress Notes (Signed)
Subjective:    Patient ID: Kathryn Lozano, female    DOB: 01-10-1986, 32 y.o.   MRN: [id number]  HPI Patient is a very pleasant 32 year old Caucasian female here today for complete physical exam.  Her Pap smear was performed at her gynecologist.  She is currently not taking any birth control.  She is due for a flu shot.  Her Pap smear was performed during her pregnancy and was normal.  Her depression screen is significant for several days reporting trouble falling asleep, feeling tired, overeating, feeling down.  However the patient is not clinically depressed.  She is interested in resuming Topamax.  She took this in the past for migraine prevention.  After the birth of her child, the migraines have returned.  She is not breast-feeding.  However she is not currently on any birth control.  We had a very long discussion about the risk of Tourette agenic effects of Topamax on unintended pregnancies.  I have recommended some type of birth control prior to starting Topamax.  She plans to meet with her gynecologist to discuss placing an IUD.  She states that she is not sexually active and has not been for more than a year.  I am willing to give the patient Topamax but I also warned her about birth defects if she were to become pregnant and recommended birth control.  Previously she was taking 100 mg a day in divided doses. Past Medical History:  Diagnosis Date  . ADHD (attention deficit hyperactivity disorder)   . Anxiety   . Heartburn   . Migraines   . PONV (postoperative nausea and vomiting)    Pt reports nausea, "sensitive stomach"  . Renal disorder    KIDNEY STONES   Past Surgical History:  Procedure Laterality Date  . CESAREAN SECTION N/A 06/23/2017   Procedure: CESAREAN SECTION;  Surgeon: Brayton Mars, MD;  Location: [facility];  Service: Obstetrics;  Laterality: N/A;  . CHOLECYSTECTOMY N/A 09/14/2014   Procedure: LAPAROSCOPIC CHOLECYSTECTOMY;  Surgeon: Coralie Keens, MD;   Location: [facility];  Service: General;  Laterality: N/A;  . CYSTOSCOPY W/ URETERAL STENT PLACEMENT Left 01/30/2016   Procedure: CYSTOSCOPY WITH STENT REPLACEMENT;  Surgeon: Hollice Espy, MD;  Location: [facility];  Service: Urology;  Laterality: Left;  . CYSTOSCOPY WITH STENT PLACEMENT  01/24/2016   Procedure: CYSTOSCOPY WITH STENT PLACEMENT;  Surgeon: Hollice Espy, MD;  Location: [facility];  Service: Urology;;  . LITHOTRIPSY  2005  . URETEROSCOPY  01/24/2016   Procedure: URETEROSCOPY;  Surgeon: Hollice Espy, MD;  Location: [facility];  Service: Urology;;  . URETEROSCOPY Left 01/30/2016   Procedure: URETEROSCOPY;  Surgeon: Hollice Espy, MD;  Location: [facility];  Service: Urology;  Laterality: Left;  . WISDOM TOOTH EXTRACTION     Current Outpatient Medications on File Prior to Visit  Medication Sig Dispense Refill  . fluconazole (DIFLUCAN) 150 MG tablet Take 1 tablet (150 mg total) by mouth every 3 (three) days as needed (yeast vaginal infection sx). 2 tablet 0  . lisdexamfetamine (VYVANSE) 30 MG capsule Take 1 capsule (30 mg total) by mouth daily. 30 capsule 0  . montelukast (SINGULAIR) 10 MG tablet Take 1 tablet (10 mg total) by mouth daily. 90 tablet 3  . pantoprazole (PROTONIX) 40 MG tablet Take 1 tablet (40 mg total) by mouth daily. 90 tablet 3  . amoxicillin-clavulanate (AUGMENTIN) 875-125 MG tablet Take 1 tablet by mouth 2 (two) times daily for 7 days. (Patient not taking: Reported on 05/13/2018) 14  tablet 0  . predniSONE (DELTASONE) 20 MG tablet 2 tabs poqday 1-3, 1 tabs poqday 4-6 (Patient not taking: Reported on 05/13/2018) 9 tablet 0   No current facility-administered medications on file prior to visit.    Allergies  Allergen Reactions  . Roxicet [Oxycodone-Acetaminophen] Itching    Pt states that she is itching all over her body.    Social History   Socioeconomic History  . Marital status: Married    Spouse name: Not on file  . Number of children: Not on file  . Years of  education: Not on file  . Highest education level: Not on file  Occupational History  . Not on file  Social Needs  . Financial resource strain: Not on file  . Food insecurity:    Worry: Not on file    Inability: Not on file  . Transportation needs:    Medical: Not on file    Non-medical: Not on file  Tobacco Use  . Smoking status: Never Smoker  . Smokeless tobacco: Never Used  Substance and Sexual Activity  . Alcohol use: No    Comment: Once every 6 months.   . Drug use: No  . Sexual activity: Yes    Birth control/protection: None  Lifestyle  . Physical activity:    Days per week: Not on file    Minutes per session: Not on file  . Stress: Not on file  Relationships  . Social connections:    Talks on phone: Not on file    Gets together: Not on file    Attends religious service: Not on file    Active member of club or organization: Not on file    Attends meetings of clubs or organizations: Not on file    Relationship status: Not on file  . Intimate partner violence:    Fear of current or ex partner: Not on file    Emotionally abused: Not on file    Physically abused: Not on file    Forced sexual activity: Not on file  Other Topics Concern  . Not on file  Social History Narrative  . Not on file   Family History  Problem Relation Age of Onset  . Alzheimer's disease Paternal Uncle   . Thyroid cancer Paternal Uncle   . Alzheimer's disease Paternal Grandmother   . Kidney cancer Neg Hx   . Bladder Cancer Neg Hx   . Breast cancer Neg Hx   . Rashes / Skin problems Neg Hx   . Colon cancer Neg Hx   . Diabetes Neg Hx       Review of Systems  All other systems reviewed and are negative.      Objective:   Physical Exam  Constitutional: She is oriented to person, place, and time. She appears well-developed and well-nourished. No distress.  HENT:  Head: Normocephalic and atraumatic.  Right Ear: External ear normal.  Left Ear: External ear normal.  Nose: Nose  normal.  Mouth/Throat: Oropharynx is clear and moist. No oropharyngeal exudate.  Eyes: Pupils are equal, round, and reactive to light. Conjunctivae and EOM are normal. Right eye exhibits no discharge. Left eye exhibits no discharge. No scleral icterus.  Neck: Normal range of motion. Neck supple. No JVD present. No tracheal deviation present. No thyromegaly present.  Cardiovascular: Normal rate, regular rhythm and normal heart sounds. Exam reveals no gallop and no friction rub.  No murmur heard. Pulmonary/Chest: Effort normal and breath sounds normal. No stridor. No respiratory distress. She  has no wheezes. She has no rales. She exhibits no tenderness.  Abdominal: Soft. Bowel sounds are normal. She exhibits no distension and no mass. There is no tenderness. There is no rebound and no guarding.  Musculoskeletal: She exhibits no edema.  Lymphadenopathy:    She has no cervical adenopathy.  Neurological: She is alert and oriented to person, place, and time. She has normal reflexes. She displays normal reflexes. No cranial nerve deficit. She exhibits normal muscle tone. Coordination normal.  Skin: Skin is warm. No rash noted. She is not diaphoretic. No erythema. No pallor.  Psychiatric: She has a normal mood and affect. Her behavior is normal. Judgment and thought content normal.  Vitals reviewed.  Is an atypical wartlike mole on her left lower abdomen with a surrounding brown macular halo.  This is atypical and I have recommended that she see her dermatologist for possible biopsy     Assessment & Plan:  General medical exam - Plan: CBC with Differential/Platelet, COMPLETE METABOLIC PANEL WITH GFR, Lipid panel  Attention deficit hyperactivity disorder (ADHD), predominantly inattentive type  Obesity (BMI 35.0-39.9 without comorbidity) - Plan: Lipid panel  Neuropathy - Plan: Vitamin B12  Physical exam is significant for an elevated BMI.  I recommend returning fasting for a CBC, CMP, fasting lipid  panel.  I encouraged diet exercise and weight loss.  She does report some neuropathy in her left hand distal to the left elbow.  She states that while sitting, her entire left hand will occasionally go numb for no reason.  It lasts seconds and resolve spontaneously.  I am concerned about possible carpal tunnel.  I recommended nerve conduction studies with the patient declined this at the present time.  We will resume Topamax starting at 25 mg at night and ever appear to 4 weeks gradually uptitrate to 50 mg twice a day.  Because of the neuropathy I will also check a vitamin B12.  Strongly recommended the patient begin birth control to avoid birth defects on Topamax.  She states that she will schedule an appoint with her gynecologist to discuss an IUD.  Recommended some type of contraception until that day such as condoms.

## 2018-05-14 ENCOUNTER — Other Ambulatory Visit: Payer: Commercial Managed Care - PPO

## 2018-05-14 ENCOUNTER — Encounter: Payer: Self-pay | Admitting: Certified Nurse Midwife

## 2018-05-14 ENCOUNTER — Ambulatory Visit (INDEPENDENT_AMBULATORY_CARE_PROVIDER_SITE_OTHER): Payer: Commercial Managed Care - PPO | Admitting: Certified Nurse Midwife

## 2018-05-14 VITALS — BP 114/80 | HR 95 | Ht 63.0 in | Wt 225.1 lb

## 2018-05-14 DIAGNOSIS — Z30430 Encounter for insertion of intrauterine contraceptive device: Secondary | ICD-10-CM

## 2018-05-14 NOTE — Progress Notes (Signed)
Kathryn Lozano is a 32 y.o. year old G42P1011 Caucasian female who presents for placement of a Mirena IUD.  BP 114/80   Pulse 95   Ht 5\' 3"  (1.6 m)   Wt 225 lb 1.6 oz (102.1 kg)   LMP 05/11/2018 (Exact Date)   BMI 39.87 kg/m    Last sexual intercourse was over a year ago, and pregnancy test today was negative.   The risks and benefits of the method and placement have been thouroughly reviewed with the patient and all questions were answered.  Specifically the patient is aware of failure rate of 07/998, expulsion of the IUD and of possible perforation.  The patient is aware of irregular bleeding due to the method and understands the incidence of irregular bleeding diminishes with time.  Signed copy of informed consent in chart.   Time out was performed.  A small plastic speculum was placed in the vagina.  The cervix was visualized, prepped using Betadine, and grasped with a single tooth tenaculum. The uterus was sounded to 8 cm.  Mirena IUD placed per manufacturer's recommendations.   The strings were trimmed to 3 cm.  The patient was given post procedure instructions, including signs and symptoms of infection and to check for the strings after each menses or each month, and refraining from intercourse or anything in the vagina for 3 days.  She was given a Mirena care card with date Mirena placed, and date Mirena to be removed.  Reviewed red flag symptoms and when to call.   RTC x 4-6 weeks for IUD string check or sooner if needed.    Diona Fanti, CNM [facility], [facility] 05/14/18 3:21 PM   [place] [id number] Lot: [id number] Exp: 07/2020

## 2018-05-14 NOTE — Patient Instructions (Signed)
IUD PLACEMENT POST-PROCEDURE INSTRUCTIONS  1. You may take Ibuprofen, Aleve or Tylenol for pain if needed.  Cramping should resolve within in 24 hours.  2. You may have a small amount of spotting.  You should wear a mini pad for the next few days.  3. You may have intercourse after 72 hours.  If you using this for birth control, it is effective immediately.  4. You need to call if you have any pelvic pain, fever, heavy bleeding or foul smelling vaginal discharge.  Irregular bleeding is common the first several months after having an IUD placed. You do not need to call for this reason unless you are concerned.  5. Shower or bathe as normal  You should have a follow-up appointment in 4-8 weeks for a re-check to make sure you are not having any problems.Levonorgestrel intrauterine device (IUD) What is this medicine? LEVONORGESTREL IUD (LEE voe nor jes trel) is a contraceptive (birth control) device. The device is placed inside the uterus by a healthcare professional. It is used to prevent pregnancy. This device can also be used to treat heavy bleeding that occurs during your period. This medicine may be used for other purposes; ask your health care provider or pharmacist if you have questions. COMMON BRAND NAME(S): Minette Headland What should I tell my health care provider before I take this medicine? They need to know if you have any of these conditions: -abnormal Pap smear -cancer of the breast, uterus, or cervix -diabetes -endometritis -genital or pelvic infection now or in the past -have more than one sexual partner or your partner has more than one partner -heart disease -history of an ectopic or tubal pregnancy -immune system problems -IUD in place -liver disease or tumor -problems with blood clots or take blood-thinners -seizures -use intravenous drugs -uterus of unusual shape -vaginal bleeding that has not been explained -an unusual or allergic reaction to  levonorgestrel, other hormones, silicone, or polyethylene, medicines, foods, dyes, or preservatives -pregnant or trying to get pregnant -breast-feeding How should I use this medicine? This device is placed inside the uterus by a health care professional. Talk to your pediatrician regarding the use of this medicine in children. Special care may be needed. Overdosage: If you think you have taken too much of this medicine contact a poison control center or emergency room at once. NOTE: This medicine is only for you. Do not share this medicine with others. What if I miss a dose? This does not apply. Depending on the brand of device you have inserted, the device will need to be replaced every 3 to 5 years if you wish to continue using this type of birth control. What may interact with this medicine? Do not take this medicine with any of the following medications: -amprenavir -bosentan -fosamprenavir This medicine may also interact with the following medications: -aprepitant -armodafinil -barbiturate medicines for inducing sleep or treating seizures -bexarotene -boceprevir -griseofulvin -medicines to treat seizures like carbamazepine, ethotoin, felbamate, oxcarbazepine, phenytoin, topiramate -modafinil -pioglitazone -rifabutin -rifampin -rifapentine -some medicines to treat HIV infection like atazanavir, efavirenz, indinavir, lopinavir, nelfinavir, tipranavir, ritonavir -St. John's wort -warfarin This list may not describe all possible interactions. Give your health care provider a list of all the medicines, herbs, non-prescription drugs, or dietary supplements you use. Also tell them if you smoke, drink alcohol, or use illegal drugs. Some items may interact with your medicine. What should I watch for while using this medicine? Visit your doctor or health care professional for regular  check ups. See your doctor if you or your partner has sexual contact with others, becomes HIV positive, or  gets a sexual transmitted disease. This product does not protect you against HIV infection (AIDS) or other sexually transmitted diseases. You can check the placement of the IUD yourself by reaching up to the top of your vagina with clean fingers to feel the threads. Do not pull on the threads. It is a good habit to check placement after each menstrual period. Call your doctor right away if you feel more of the IUD than just the threads or if you cannot feel the threads at all. The IUD may come out by itself. You may become pregnant if the device comes out. If you notice that the IUD has come out use a backup birth control method like condoms and call your health care provider. Using tampons will not change the position of the IUD and are okay to use during your period. This IUD can be safely scanned with magnetic resonance imaging (MRI) only under specific conditions. Before you have an MRI, tell your healthcare provider that you have an IUD in place, and which type of IUD you have in place. What side effects may I notice from receiving this medicine? Side effects that you should report to your doctor or health care professional as soon as possible: -allergic reactions like skin rash, itching or hives, swelling of the face, lips, or tongue -fever, flu-like symptoms -genital sores -high blood pressure -no menstrual period for 6 weeks during use -pain, swelling, warmth in the leg -pelvic pain or tenderness -severe or sudden headache -signs of pregnancy -stomach cramping -sudden shortness of breath -trouble with balance, talking, or walking -unusual vaginal bleeding, discharge -yellowing of the eyes or skin Side effects that usually do not require medical attention (report to your doctor or health care professional if they continue or are bothersome): -acne -breast pain -change in sex drive or performance -changes in weight -cramping, dizziness, or faintness while the device is being  inserted -headache -irregular menstrual bleeding within first 3 to 6 months of use -nausea This list may not describe all possible side effects. Call your doctor for medical advice about side effects. You may report side effects to FDA at 1-800-FDA-1088. Where should I keep my medicine? This does not apply. NOTE: This sheet is a summary. It may not cover all possible information. If you have questions about this medicine, talk to your doctor, pharmacist, or health care provider.  2018 Elsevier/Gold Standard (2016-04-04 14:14:56)

## 2018-05-24 ENCOUNTER — Telehealth: Payer: Self-pay | Admitting: Obstetrics and Gynecology

## 2018-05-24 NOTE — Telephone Encounter (Signed)
The patient has one month to pay an old [place] bill from 12/04/16 of $400 and insurance is not paying, and the patient just got the bill.  She is very concerned and not understanding why she got it now instead of a year ago, please advise, thanks.

## 2018-05-25 ENCOUNTER — Encounter: Payer: Self-pay | Admitting: *Deleted

## 2018-05-25 NOTE — Telephone Encounter (Signed)
Sent pt my chart message about bill

## 2018-06-10 ENCOUNTER — Ambulatory Visit (INDEPENDENT_AMBULATORY_CARE_PROVIDER_SITE_OTHER): Payer: Commercial Managed Care - PPO | Admitting: Certified Nurse Midwife

## 2018-06-10 ENCOUNTER — Encounter: Payer: Self-pay | Admitting: Certified Nurse Midwife

## 2018-06-10 VITALS — BP 108/68 | HR 85 | Ht 63.0 in | Wt 219.7 lb

## 2018-06-10 DIAGNOSIS — Z30431 Encounter for routine checking of intrauterine contraceptive device: Secondary | ICD-10-CM | POA: Diagnosis not present

## 2018-06-10 DIAGNOSIS — Z975 Presence of (intrauterine) contraceptive device: Secondary | ICD-10-CM | POA: Diagnosis not present

## 2018-06-10 NOTE — Progress Notes (Signed)
Patient here to follow-up after having Mirena IUD inserted on 11/8, c/o intermittent abdominal cramping.

## 2018-06-10 NOTE — Patient Instructions (Signed)
Preventive Care 18-39 Years, Female Preventive care refers to lifestyle choices and visits with your health care provider that can promote health and wellness. What does preventive care include?  A yearly physical exam. This is also called an annual well check.  Dental exams once or twice a year.  Routine eye exams. Ask your health care provider how often you should have your eyes checked.  Personal lifestyle choices, including: ? Daily care of your teeth and gums. ? Regular physical activity. ? Eating a healthy diet. ? Avoiding tobacco and drug use. ? Limiting alcohol use. ? Practicing safe sex. ? Taking vitamin and mineral supplements as recommended by your health care provider. What happens during an annual well check? The services and screenings done by your health care provider during your annual well check will depend on your age, overall health, lifestyle risk factors, and family history of disease. Counseling Your health care provider may ask you questions about your:  Alcohol use.  Tobacco use.  Drug use.  Emotional well-being.  Home and relationship well-being.  Sexual activity.  Eating habits.  Work and work Statistician.  Method of birth control.  Menstrual cycle.  Pregnancy history.  Screening You may have the following tests or measurements:  Height, weight, and BMI.  Diabetes screening. This is done by checking your blood sugar (glucose) after you have not eaten for a while (fasting).  Blood pressure.  Lipid and cholesterol levels. These may be checked every 5 years starting at age 66.  Skin check.  Hepatitis C blood test.  Hepatitis B blood test.  Sexually transmitted disease (STD) testing.  BRCA-related cancer screening. This may be done if you have a family history of breast, ovarian, tubal, or peritoneal cancers.  Pelvic exam and Pap test. This may be done every 3 years starting at age 40. Starting at age 59, this may be done every 5  years if you have a Pap test in combination with an HPV test.  Discuss your test results, treatment options, and if necessary, the need for more tests with your health care provider. Vaccines Your health care provider may recommend certain vaccines, such as:  Influenza vaccine. This is recommended every year.  Tetanus, diphtheria, and acellular pertussis (Tdap, Td) vaccine. You may need a Td booster every 10 years.  Varicella vaccine. You may need this if you have not been vaccinated.  HPV vaccine. If you are 69 or younger, you may need three doses over 6 months.  Measles, mumps, and rubella (MMR) vaccine. You may need at least one dose of MMR. You may also need a second dose.  Pneumococcal 13-valent conjugate (PCV13) vaccine. You may need this if you have certain conditions and were not previously vaccinated.  Pneumococcal polysaccharide (PPSV23) vaccine. You may need one or two doses if you smoke cigarettes or if you have certain conditions.  Meningococcal vaccine. One dose is recommended if you are age 27-21 years and a first-year college student living in a residence hall, or if you have one of several medical conditions. You may also need additional booster doses.  Hepatitis A vaccine. You may need this if you have certain conditions or if you travel or work in places where you may be exposed to hepatitis A.  Hepatitis B vaccine. You may need this if you have certain conditions or if you travel or work in places where you may be exposed to hepatitis B.  Haemophilus influenzae type b (Hib) vaccine. You may need this if  you have certain risk factors.  Talk to your health care provider about which screenings and vaccines you need and how often you need them. This information is not intended to replace advice given to you by your health care provider. Make sure you discuss any questions you have with your health care provider. Document Released: 08/19/2001 Document Revised: 03/12/2016  Document Reviewed: 04/24/2015 Elsevier Interactive Patient Education  2018 Elsevier Inc. Levonorgestrel intrauterine device (IUD) What is this medicine? LEVONORGESTREL IUD (LEE voe nor jes trel) is a contraceptive (birth control) device. The device is placed inside the uterus by a healthcare professional. It is used to prevent pregnancy. This device can also be used to treat heavy bleeding that occurs during your period. This medicine may be used for other purposes; ask your health care provider or pharmacist if you have questions. COMMON BRAND NAME(S): Kyleena, LILETTA, Mirena, Skyla What should I tell my health care provider before I take this medicine? They need to know if you have any of these conditions: -abnormal Pap smear -cancer of the breast, uterus, or cervix -diabetes -endometritis -genital or pelvic infection now or in the past -have more than one sexual partner or your partner has more than one partner -heart disease -history of an ectopic or tubal pregnancy -immune system problems -IUD in place -liver disease or tumor -problems with blood clots or take blood-thinners -seizures -use intravenous drugs -uterus of unusual shape -vaginal bleeding that has not been explained -an unusual or allergic reaction to levonorgestrel, other hormones, silicone, or polyethylene, medicines, foods, dyes, or preservatives -pregnant or trying to get pregnant -breast-feeding How should I use this medicine? This device is placed inside the uterus by a health care professional. Talk to your pediatrician regarding the use of this medicine in children. Special care may be needed. Overdosage: If you think you have taken too much of this medicine contact a poison control center or emergency room at once. NOTE: This medicine is only for you. Do not share this medicine with others. What if I miss a dose? This does not apply. Depending on the brand of device you have inserted, the device will need  to be replaced every 3 to 5 years if you wish to continue using this type of birth control. What may interact with this medicine? Do not take this medicine with any of the following medications: -amprenavir -bosentan -fosamprenavir This medicine may also interact with the following medications: -aprepitant -armodafinil -barbiturate medicines for inducing sleep or treating seizures -bexarotene -boceprevir -griseofulvin -medicines to treat seizures like carbamazepine, ethotoin, felbamate, oxcarbazepine, phenytoin, topiramate -modafinil -pioglitazone -rifabutin -rifampin -rifapentine -some medicines to treat HIV infection like atazanavir, efavirenz, indinavir, lopinavir, nelfinavir, tipranavir, ritonavir -St. John's wort -warfarin This list may not describe all possible interactions. Give your health care provider a list of all the medicines, herbs, non-prescription drugs, or dietary supplements you use. Also tell them if you smoke, drink alcohol, or use illegal drugs. Some items may interact with your medicine. What should I watch for while using this medicine? Visit your doctor or health care professional for regular check ups. See your doctor if you or your partner has sexual contact with others, becomes HIV positive, or gets a sexual transmitted disease. This product does not protect you against HIV infection (AIDS) or other sexually transmitted diseases. You can check the placement of the IUD yourself by reaching up to the top of your vagina with clean fingers to feel the threads. Do not pull on the threads. It   is a good habit to check placement after each menstrual period. Call your doctor right away if you feel more of the IUD than just the threads or if you cannot feel the threads at all. The IUD may come out by itself. You may become pregnant if the device comes out. If you notice that the IUD has come out use a backup birth control method like condoms and call your health care  provider. Using tampons will not change the position of the IUD and are okay to use during your period. This IUD can be safely scanned with magnetic resonance imaging (MRI) only under specific conditions. Before you have an MRI, tell your healthcare provider that you have an IUD in place, and which type of IUD you have in place. What side effects may I notice from receiving this medicine? Side effects that you should report to your doctor or health care professional as soon as possible: -allergic reactions like skin rash, itching or hives, swelling of the face, lips, or tongue -fever, flu-like symptoms -genital sores -high blood pressure -no menstrual period for 6 weeks during use -pain, swelling, warmth in the leg -pelvic pain or tenderness -severe or sudden headache -signs of pregnancy -stomach cramping -sudden shortness of breath -trouble with balance, talking, or walking -unusual vaginal bleeding, discharge -yellowing of the eyes or skin Side effects that usually do not require medical attention (report to your doctor or health care professional if they continue or are bothersome): -acne -breast pain -change in sex drive or performance -changes in weight -cramping, dizziness, or faintness while the device is being inserted -headache -irregular menstrual bleeding within first 3 to 6 months of use -nausea This list may not describe all possible side effects. Call your doctor for medical advice about side effects. You may report side effects to FDA at 1-800-FDA-1088. Where should I keep my medicine? This does not apply. NOTE: This sheet is a summary. It may not cover all possible information. If you have questions about this medicine, talk to your doctor, pharmacist, or health care provider.  2018 Elsevier/Gold Standard (2016-04-04 14:14:56)

## 2018-06-11 ENCOUNTER — Encounter: Payer: Commercial Managed Care - PPO | Admitting: Certified Nurse Midwife

## 2018-06-11 DIAGNOSIS — Z975 Presence of (intrauterine) contraceptive device: Secondary | ICD-10-CM | POA: Insufficient documentation

## 2018-06-11 DIAGNOSIS — Z30431 Encounter for routine checking of intrauterine contraceptive device: Secondary | ICD-10-CM | POA: Insufficient documentation

## 2018-06-11 NOTE — Progress Notes (Signed)
  GYNECOLOGY OFFICE ENCOUNTER NOTE  History:  32 y.o. G2P1011 here today for today for IUD string check; Mirena  IUD was placed  05/14/2018. Notes intermittent abdominal cramping, no concerning side effects.  Denies difficulty breathing or respiratory distress, chest pain, abdominal pain, excessive vaginal bleeding, dysuria, and leg pain or swelling.   The following portions of the patient's history were reviewed and updated as appropriate: allergies, current medications, past family history, past medical history, past social history, past surgical history and problem list. Last pap smear on 11/2016 was normal, negative HRHPV.  Review of Systems:   Pertinent items are noted in HPI.  Objective:   BP 108/68   Pulse 85   Ht 5\' 3"  (1.6 m)   Wt 219 lb 11.2 oz (99.7 kg)   LMP 06/08/2018 (Exact Date)   BMI 38.92 kg/m   Physical Exam  CONSTITUTIONAL: Well-developed, well-nourished female in no acute distress.   ABDOMEN: Soft, no distention noted.    PELVIC: Normal appearing external genitalia; normal appearing vaginal mucosa and cervix.  IUD strings visualized, about 3 cm in length outside cervix.   Assessment & Plan:   Normal IUD check.   Patient to keep IUD in place for up to five years; can come in for removal if she desires pregnancy earlier or for or concerning side effects.  Encouraged routine health maintenance.   Reviewed red flag symptoms and when to call.   RTC x 5 months for ANNUAL EXAM or sooner if needed.    Diona Fanti, CNM [facility], [facility]

## 2018-07-06 ENCOUNTER — Encounter: Payer: Self-pay | Admitting: Family Medicine

## 2018-07-06 ENCOUNTER — Ambulatory Visit: Payer: Commercial Managed Care - PPO | Admitting: Family Medicine

## 2018-07-06 VITALS — BP 110/74 | HR 108 | Temp 98.5°F | Resp 18 | Ht 63.0 in | Wt 215.0 lb

## 2018-07-06 DIAGNOSIS — J019 Acute sinusitis, unspecified: Secondary | ICD-10-CM

## 2018-07-06 DIAGNOSIS — B9689 Other specified bacterial agents as the cause of diseases classified elsewhere: Secondary | ICD-10-CM | POA: Diagnosis not present

## 2018-07-06 MED ORDER — AMOXICILLIN-POT CLAVULANATE 875-125 MG PO TABS: 1.0000 | ORAL_TABLET | Freq: Two times a day (BID) | ORAL | 0 refills | Status: DC

## 2018-07-06 MED ORDER — BENZONATATE 200 MG PO CAPS: 200.0000 mg | ORAL_CAPSULE | Freq: Two times a day (BID) | ORAL | 0 refills | Status: DC | PRN

## 2018-07-06 NOTE — Progress Notes (Signed)
Subjective:    Patient ID: Kathryn Lozano, female    DOB: 12/04/1985, 32 y.o.   MRN: [id number]  HPI  Symptoms began with a sore throat 2 weeks ago.  Over the last 7 days, symptoms have drastically worsened.  She now has pain and pressure in her right frontal and right maxillary sinus area.  She also complains of severe pain and pressure in her right ear.  She has constant rhinorrhea and postnasal drip causing a cough and chest congestion.  She is unable to sleep due to the cough.  She is taking Sudafed and Mucinex DM.  The last 2 days the symptoms have gotten much worse.  She denies any fever.  She denies any sputum production.  She is requesting a refill on her Vyvanse as well Past Medical History:  Diagnosis Date  . ADHD (attention deficit hyperactivity disorder)   . Anxiety   . Heartburn   . Migraines   . PONV (postoperative nausea and vomiting)    Pt reports nausea, "sensitive stomach"  . Renal disorder    KIDNEY STONES   Past Surgical History:  Procedure Laterality Date  . CESAREAN SECTION N/A 06/23/2017   Procedure: CESAREAN SECTION;  Surgeon: Brayton Mars, MD;  Location: [facility];  Service: Obstetrics;  Laterality: N/A;  . CHOLECYSTECTOMY N/A 09/14/2014   Procedure: LAPAROSCOPIC CHOLECYSTECTOMY;  Surgeon: Coralie Keens, MD;  Location: [facility];  Service: General;  Laterality: N/A;  . CYSTOSCOPY W/ URETERAL STENT PLACEMENT Left 01/30/2016   Procedure: CYSTOSCOPY WITH STENT REPLACEMENT;  Surgeon: Hollice Espy, MD;  Location: [facility];  Service: Urology;  Laterality: Left;  . CYSTOSCOPY WITH STENT PLACEMENT  01/24/2016   Procedure: CYSTOSCOPY WITH STENT PLACEMENT;  Surgeon: Hollice Espy, MD;  Location: [facility];  Service: Urology;;  . LITHOTRIPSY  2005  . URETEROSCOPY  01/24/2016   Procedure: URETEROSCOPY;  Surgeon: Hollice Espy, MD;  Location: [facility];  Service: Urology;;  . URETEROSCOPY Left 01/30/2016   Procedure: URETEROSCOPY;  Surgeon: Hollice Espy, MD;   Location: [facility];  Service: Urology;  Laterality: Left;  . WISDOM TOOTH EXTRACTION     Current Outpatient Medications on File Prior to Visit  Medication Sig Dispense Refill  . levonorgestrel (MIRENA) 20 MCG/24HR IUD 1 each by Intrauterine route once.    . lisdexamfetamine (VYVANSE) 30 MG capsule Take 1 capsule (30 mg total) by mouth daily. 30 capsule 0  . montelukast (SINGULAIR) 10 MG tablet Take 1 tablet (10 mg total) by mouth daily. 90 tablet 3  . pantoprazole (PROTONIX) 40 MG tablet Take 1 tablet (40 mg total) by mouth daily. 90 tablet 3  . topiramate (TOPAMAX) 25 MG tablet Take 2 tablets (50 mg total) by mouth 2 (two) times daily. 120 tablet 2   No current facility-administered medications on file prior to visit.    Allergies  Allergen Reactions  . Roxicet [Oxycodone-Acetaminophen] Itching    Pt states that she is itching all over her body.    Social History   Socioeconomic History  . Marital status: Married    Spouse name: Not on file  . Number of children: Not on file  . Years of education: Not on file  . Highest education level: Not on file  Occupational History  . Not on file  Social Needs  . Financial resource strain: Not on file  . Food insecurity:    Worry: Not on file    Inability: Not on file  . Transportation needs:  Medical: Not on file    Non-medical: Not on file  Tobacco Use  . Smoking status: Never Smoker  . Smokeless tobacco: Never Used  Substance and Sexual Activity  . Alcohol use: Yes    Comment: occasional   . Drug use: No  . Sexual activity: Not Currently    Birth control/protection: I.U.D.  Lifestyle  . Physical activity:    Days per week: Not on file    Minutes per session: Not on file  . Stress: Not on file  Relationships  . Social connections:    Talks on phone: Not on file    Gets together: Not on file    Attends religious service: Not on file    Active member of club or organization: Not on file    Attends meetings of clubs or  organizations: Not on file    Relationship status: Not on file  . Intimate partner violence:    Fear of current or ex partner: Not on file    Emotionally abused: Not on file    Physically abused: Not on file    Forced sexual activity: Not on file  Other Topics Concern  . Not on file  Social History Narrative  . Not on file   Family History  Problem Relation Age of Onset  . Alzheimer's disease Paternal Uncle   . Thyroid cancer Paternal Uncle   . Alzheimer's disease Paternal Grandmother   . Kidney cancer Neg Hx   . Bladder Cancer Neg Hx   . Breast cancer Neg Hx   . Rashes / Skin problems Neg Hx   . Colon cancer Neg Hx   . Diabetes Neg Hx   . Ovarian cancer Neg Hx       Review of Systems  All other systems reviewed and are negative.      Objective:   Physical Exam  Constitutional: She appears well-developed and well-nourished. No distress.  HENT:  Head: Normocephalic and atraumatic.  Right Ear: External ear normal.  Left Ear: External ear normal.  Nose: Mucosal edema and rhinorrhea present. Right sinus exhibits maxillary sinus tenderness and frontal sinus tenderness.  Mouth/Throat: Oropharynx is clear and moist. No oropharyngeal exudate, posterior oropharyngeal edema or posterior oropharyngeal erythema.  Eyes: Pupils are equal, round, and reactive to light. Conjunctivae and EOM are normal. Right eye exhibits no discharge. Left eye exhibits no discharge. No scleral icterus.  Neck: Normal range of motion. Neck supple. No JVD present. No tracheal deviation present.  Cardiovascular: Normal rate, regular rhythm, normal heart sounds and intact distal pulses. Exam reveals no gallop and no friction rub.  No murmur heard. Pulmonary/Chest: Effort normal and breath sounds normal. No stridor. No respiratory distress. She has no wheezes. She has no rales. She exhibits no tenderness.  Lymphadenopathy:    She has no cervical adenopathy.  Skin: She is not diaphoretic.  Vitals  reviewed.         Assessment & Plan:  Acute bacterial rhinosinusitis  I believe the patient has a viral upper respiratory infection that has turned into a secondary bacterial sinusitis.  Begin Augmentin 875 mg p.o. twice daily for 10 days.  Continue Mucinex DM and Sudafed for symptom relief.  She could certainly add Flonase or nasal saline rinses to help with symptoms.  Use Tessalon 200 mg every 8 hours as needed for cough

## 2018-07-16 ENCOUNTER — Encounter: Payer: Self-pay | Admitting: Family Medicine

## 2018-07-16 MED ORDER — LISDEXAMFETAMINE DIMESYLATE 30 MG PO CAPS: 30.0000 mg | ORAL_CAPSULE | Freq: Every day | ORAL | 0 refills | Status: DC

## 2018-07-16 NOTE — Telephone Encounter (Signed)
Ok to refill??  Last office visit 07/06/2018  Last refill 04/13/2018.

## 2018-08-04 ENCOUNTER — Other Ambulatory Visit: Payer: Self-pay | Admitting: Family Medicine

## 2018-09-14 ENCOUNTER — Other Ambulatory Visit: Payer: Self-pay

## 2018-09-15 ENCOUNTER — Encounter: Payer: Self-pay | Admitting: Family Medicine

## 2018-09-15 ENCOUNTER — Ambulatory Visit: Payer: Commercial Managed Care - PPO | Admitting: Family Medicine

## 2018-09-15 VITALS — BP 118/80 | HR 87 | Temp 98.3°F | Resp 15 | Ht 63.0 in | Wt 205.0 lb

## 2018-09-15 DIAGNOSIS — R21 Rash and other nonspecific skin eruption: Secondary | ICD-10-CM | POA: Diagnosis not present

## 2018-09-15 DIAGNOSIS — R197 Diarrhea, unspecified: Secondary | ICD-10-CM | POA: Diagnosis not present

## 2018-09-15 DIAGNOSIS — R112 Nausea with vomiting, unspecified: Secondary | ICD-10-CM

## 2018-09-15 LAB — URINALYSIS, ROUTINE W REFLEX MICROSCOPIC
BILIRUBIN URINE: NEGATIVE
Glucose, UA: NEGATIVE
Hyaline Cast: NONE SEEN /LPF
KETONES UR: NEGATIVE
LEUKOCYTE UA: NEGATIVE
Nitrite: NEGATIVE
PROTEIN: NEGATIVE
Specific Gravity, Urine: 1.028 (ref 1.001–1.03)
WBC UA: NONE SEEN /HPF (ref 0–5)
pH: 5.5 (ref 5.0–8.0)

## 2018-09-15 LAB — MICROSCOPIC MESSAGE

## 2018-09-15 MED ORDER — ONDANSETRON 4 MG PO TBDP: 4.0000 mg | ORAL_TABLET | Freq: Three times a day (TID) | ORAL | 0 refills | Status: DC | PRN

## 2018-09-15 MED ORDER — DICYCLOMINE HCL 20 MG PO TABS: 20.0000 mg | ORAL_TABLET | Freq: Three times a day (TID) | ORAL | 0 refills | Status: DC | PRN

## 2018-09-15 MED ORDER — TRIAMCINOLONE ACETONIDE 0.1 % EX OINT: 1.0000 "application " | TOPICAL_OINTMENT | Freq: Two times a day (BID) | CUTANEOUS | 0 refills | Status: DC

## 2018-09-15 NOTE — Progress Notes (Signed)
Patient ID: Kathryn Lozano, female    DOB: 01-Aug-1985, 33 y.o.   MRN: [id number]  PCP: Susy Frizzle, MD  Chief Complaint  Patient presents with  . Diarrhea    Patient in with c/o diarrhea., and abdominal pain Onset of symptoms Sunday night.  . Rash    Also has c/o rash to bilateral hands. Onset 3 weeks ago.     Subjective:   Kathryn Lozano is a 33 y.o. female, presents to clinic with CC of nausea, vomiting, and diarrhea.  Her child was ill last week with similar sx.  She had several vomiting episodes, emesis watery and stomach contents NBNB, diarrhea watery, at least 10 episodes today.  She has not eaten very much in a few days, but she is tolerating fluids and urinating normally.    She has some rumbling in her stomach and queasiness, but no pain.  She denies urinary sx, vaginal sx, fever.  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  No recent abx.  No recent travel or suspicious food intake.   She has some itchy patches/rash to her hands that she has not been able to get to go away for several weeks with OTC meds.       Patient Active Problem List   Diagnosis Date Noted  . IUD (intrauterine device) in place 06/11/2018  . Morbid obesity ([place]) 12/04/2016  . Chronic cough 12/28/2012  . Post-nasal drip 12/28/2012  . Anxiety   . Migraines   . ADHD (attention deficit hyperactivity disorder)      Prior to Admission medications   Medication Sig Start Date End Date Taking? Authorizing Provider  levonorgestrel (MIRENA) 20 MCG/24HR IUD 1 each by Intrauterine route once.   Yes [provider]  lisdexamfetamine (VYVANSE) 30 MG capsule Take 1 capsule (30 mg total) by mouth daily. 07/16/18  Yes Susy Frizzle, MD  montelukast (SINGULAIR) 10 MG tablet Take 1 tablet (10 mg total) by mouth daily. 03/01/18  Yes Susy Frizzle, MD  pantoprazole (PROTONIX) 40 MG tablet Take 1 tablet (40 mg total) by mouth daily. 02/08/18  Yes Susy Frizzle, MD  topiramate (TOPAMAX) 25 MG tablet TAKE 2 TABLETS (50 MG TOTAL) BY MOUTH 2 (TWO) TIMES DAILY. 08/04/18  Yes Susy Frizzle, MD     Allergies  Allergen Reactions  . Roxicet [Oxycodone-Acetaminophen] Itching    Pt states that she is itching all over her body.   Review of Systems  Constitutional: Negative.   HENT: Negative.   Eyes: Negative.   Respiratory: Negative.   Cardiovascular: Negative.   Gastrointestinal: Negative.  Negative for abdominal distention, abdominal pain, anal bleeding, blood in stool and rectal pain.  Endocrine: Negative.   Genitourinary: Negative.   Musculoskeletal: Negative.  Negative for arthralgias, myalgias and neck pain.  Skin: Negative.   Allergic/Immunologic: Negative.   Neurological: Negative.  Negative for headaches.  Hematological: Negative.   Psychiatric/Behavioral: Negative.   All other systems reviewed and are negative.      Objective:    Vitals:   09/15/18 1038  BP: 118/80  Pulse: 87  Resp: 15  Temp: 98.3 F (36.8 C)  TempSrc: Oral  SpO2: 98%  Weight: 205 lb (93 kg)  Height: 5\' 3"  (1.6 m)      Physical Exam Vitals signs and nursing note reviewed.  Constitutional:      General: She is not in acute distress.    Appearance: She  is well-developed. She is not ill-appearing, toxic-appearing or diaphoretic.  HENT:     Head: Normocephalic and atraumatic.     Right Ear: External ear normal.     Left Ear: External ear normal.     Nose: Nose normal.     Mouth/Throat:     Mouth: Mucous membranes are moist.     Pharynx: Oropharynx is clear. No oropharyngeal exudate or posterior oropharyngeal erythema.  Eyes:     General: No scleral icterus.       Right eye: No discharge.        Left eye: No discharge.     Conjunctiva/sclera: Conjunctivae normal.     Pupils: Pupils are equal, round, and reactive to light.  Neck:     Musculoskeletal: Normal range of motion and neck supple.     Trachea: No tracheal deviation.   Cardiovascular:     Rate and Rhythm: Normal rate and regular rhythm.     Pulses: Normal pulses.     Heart sounds: Normal heart sounds. No murmur. No friction rub. No gallop.   Pulmonary:     Effort: Pulmonary effort is normal. No respiratory distress.     Breath sounds: Normal breath sounds. No stridor. No wheezing or rales.  Chest:     Chest wall: No tenderness.  Abdominal:     General: Bowel sounds are normal. There is no distension.     Palpations: Abdomen is soft. There is no mass.     Tenderness: There is no abdominal tenderness. There is no right CVA tenderness, left CVA tenderness, guarding or rebound.  Musculoskeletal: Normal range of motion.  Lymphadenopathy:     Cervical: No cervical adenopathy.  Skin:    General: Skin is warm and dry.     Capillary Refill: Capillary refill takes less than 2 seconds.     Coloration: Skin is not jaundiced or pale.     Findings: Rash present.  Neurological:     Mental Status: She is alert and oriented to person, place, and time.     Motor: No abnormal muscle tone.     Coordination: Coordination normal.  Psychiatric:        Mood and Affect: Mood normal.        Behavior: Behavior normal.            Assessment & Plan:   Otherwise healthy female presents with N/V/D for several days, tolerating fluids, but having frequent watery bowel movements.  Pt appears well, abd exam benign, she is concerned about testing for C.diff, although hx is very unlikely with son with same illness.  Tx nausea, push fluids, slowly advance diet, stool testing if sx continue.  UA was leuk neg and nitrite neg.  I suspect viral gastroenteritis, antiemetics rx'd, she can try bentyl for abdominal cramping, for the meantime advised to d/c immodium, focus on hydration.  F/up if any worsening or prolonged sx.    ICD-10-CM   1. Nausea vomiting and diarrhea H88.5 COMPLETE METABOLIC PANEL WITH GFR   R19.7 Urinalysis, Routine w reflex microscopic    ondansetron (ZOFRAN  ODT) 4 MG disintegrating tablet    dicyclomine (BENTYL) 20 MG tablet    Stool culture    Clostridium difficile Toxin B, Qualitative, Real-Time PCR    Clostridium difficile Toxin B, Qualitative, Real-Time PCR    Stool culture    Microscopic Message  2. Rash and nonspecific skin eruption R21 triamcinolone ointment (KENALOG) 0.1 %       Delsa Grana, PA-C  09/15/18 10:46 AM

## 2018-09-15 NOTE — Patient Instructions (Addendum)
I suspect it is a viral GI illness.  Its most important to keep hydrated, be urinating several times a day.   If you are not having fever and you can adequately wash your hands you should be able to work without being contagious - of course if you are too weak, have abdominal pain, or and having too frequent of bowel movements, this may be too difficult and we are more than happy to excuse you from work for a few more days.   Viral Gastroenteritis, Adult  Viral gastroenteritis is also known as the stomach flu. This condition is caused by certain germs (viruses). These germs can be passed from person to person very easily (are very contagious). This condition can cause sudden watery poop (diarrhea), fever, and throwing up (vomiting). Having watery poop and throwing up can make you feel weak and cause you to get dehydrated. Dehydration can make you tired and thirsty, make you have a dry mouth, and make it so you pee (urinate) less often. Older adults and people with other diseases or a weak defense system (immune system) are at higher risk for dehydration. It is important to replace the fluids that you lose from having watery poop and throwing up. Follow these instructions at home: Follow instructions from your doctor about how to care for yourself at home. Eating and drinking Follow these instructions as told by your doctor:  Take an oral rehydration solution (ORS). This is a drink that is sold at pharmacies and stores.  Drink clear fluids in small amounts as you are able, such as: ? Water. ? Ice chips. ? Diluted fruit juice. ? Low-calorie sports drinks.  Eat bland, easy-to-digest foods in small amounts as you are able, such as: ? Bananas. ? Applesauce. ? Rice. ? Low-fat (lean) meats. ? Toast. ? Crackers.  Avoid fluids that have a lot of sugar or caffeine in them.  Avoid alcohol.  Avoid spicy or fatty foods. General instructions   Drink enough fluid to keep your pee (urine) clear  or pale yellow.  Wash your hands often. If you cannot use soap and water, use hand sanitizer.  Make sure that all people in your home wash their hands well and often.  Rest at home while you get better.  Take over-the-counter and prescription medicines only as told by your doctor.  Watch your condition for any changes.  Take a warm bath to help with any burning or pain from having watery poop.  Keep all follow-up visits as told by your doctor. This is important. Contact a doctor if:  You cannot keep fluids down.  Your symptoms get worse.  You have new symptoms.  You feel light-headed or dizzy.  You have muscle cramps. Get help right away if:  You have chest pain.  You feel very weak or you pass out (faint).  You see blood in your throw-up.  Your throw-up looks like coffee grounds.  You have bloody or black poop (stools) or poop that look like tar.  You have a very bad headache, a stiff neck, or both.  You have a rash.  You have very bad pain, cramping, or bloating in your belly (abdomen).  You have trouble breathing.  You are breathing very quickly.  Your heart is beating very quickly.  Your skin feels cold and clammy.  You feel confused.  You have pain when you pee.  You have signs of dehydration, such as: ? Dark pee, hardly any pee, or no pee. ?  Cracked lips. ? Dry mouth. ? Sunken eyes. ? Sleepiness. ? Weakness. This information is not intended to replace advice given to you by your health care provider. Make sure you discuss any questions you have with your health care provider. Document Released: 12/10/2007 Document Revised: 03/17/2018 Document Reviewed: 02/27/2015 Elsevier Interactive Patient Education  2019 [employer]    Atopic Dermatitis Atopic dermatitis is a skin disorder that causes inflammation of the skin. This is the most common type of eczema. Eczema is a group of skin conditions that cause the skin to be itchy, red, and swollen.  This condition is generally worse during the cooler winter months and often improves during the warm summer months. Symptoms can vary from person to person. Atopic dermatitis usually starts showing signs in infancy and can last through adulthood. This condition cannot be passed from one person to another (non-contagious), but it is more common in families. Atopic dermatitis may not always be present. When it is present, it is called a flare-up. What are the causes? The exact cause of this condition is not known. Flare-ups of the condition may be triggered by:  Contact with something that you are sensitive or allergic to.  Stress.  Certain foods.  Extremely hot or cold weather.  Harsh chemicals and soaps.  Dry air.  Chlorine. What increases the risk? This condition is more likely to develop in people who have a personal history or family history of eczema, allergies, asthma, or hay fever. What are the signs or symptoms? Symptoms of this condition include:  Dry, scaly skin.  Red, itchy rash.  Itchiness, which can be severe. This may occur before the skin rash. This can make sleeping difficult.  Skin thickening and cracking that can occur over time. How is this diagnosed? This condition is diagnosed based on your symptoms, a medical history, and a physical exam. How is this treated? There is no cure for this condition, but symptoms can usually be controlled. Treatment focuses on:  Controlling the itchiness and scratching. You may be given medicines, such as antihistamines or steroid creams.  Limiting exposure to things that you are sensitive or allergic to (allergens).  Recognizing situations that cause stress and developing a plan to manage stress. If your atopic dermatitis does not get better with medicines, or if it is all over your body (widespread), a treatment using a specific type of light (phototherapy) may be used. Follow these instructions at home: Skin care   Keep  your skin well-moisturized. Doing this seals in moisture and helps to prevent dryness. ? Use unscented lotions that have petroleum in them. ? Avoid lotions that contain alcohol or water. They can dry the skin.  Keep baths or showers short (less than 5 minutes) in warm water. Do not use hot water. ? Use mild, unscented cleansers for bathing. Avoid soap and bubble bath. ? Apply a moisturizer to your skin right after a bath or shower.  Do not apply anything to your skin without checking with your health care provider. General instructions  Dress in clothes made of cotton or cotton blends. Dress lightly because heat increases itchiness.  When washing your clothes, rinse your clothes twice so all of the soap is removed.  Avoid any triggers that can cause a flare-up.  Try to manage your stress.  Keep your fingernails cut short.  Avoid scratching. Scratching makes the rash and itchiness worse. It may also result in a skin infection (impetigo) due to a break in the skin caused  by scratching.  Take or apply over-the-counter and prescription medicines only as told by your health care provider.  Keep all follow-up visits as told by your health care provider. This is important.  Do not be around people who have cold sores or fever blisters. If you get the infection, it may cause your atopic dermatitis to worsen. Contact a health care provider if:  Your itchiness interferes with sleep.  Your rash gets worse or it is not better within one week of starting treatment.  You have a fever.  You have a rash flare-up after having contact with someone who has cold sores or fever blisters. Get help right away if:  You develop pus or soft yellow scabs in the rash area. Summary  This condition causes a red rash and itchy, dry, scaly skin.  Treatment focuses on controlling the itchiness and scratching, limiting exposure to things that you are sensitive or allergic to (allergens), recognizing  situations that cause stress, and developing a plan to manage stress.  Keep your skin well-moisturized.  Keep baths or showers shorter than 5 minutes and use warm water. Do not use hot water. This information is not intended to replace advice given to you by your health care provider. Make sure you discuss any questions you have with your health care provider. Document Released: 06/20/2000 Document Revised: 07/25/2016 Document Reviewed: 07/25/2016 Elsevier Interactive Patient Education  2019 [employer].

## 2018-09-16 LAB — COMPLETE METABOLIC PANEL WITH GFR
AG Ratio: 1.6 (calc) (ref 1.0–2.5)
ALBUMIN MSPROF: 4.7 g/dL (ref 3.6–5.1)
ALT: 41 U/L — AB (ref 6–29)
AST: 23 U/L (ref 10–30)
Alkaline phosphatase (APISO): 69 U/L (ref 31–125)
BUN: 9 mg/dL (ref 7–25)
CALCIUM: 10 mg/dL (ref 8.6–10.2)
CO2: 23 mmol/L (ref 20–32)
CREATININE: 0.87 mg/dL (ref 0.50–1.10)
Chloride: 107 mmol/L (ref 98–110)
GFR, EST AFRICAN AMERICAN: 102 mL/min/{1.73_m2} (ref >=60)
GFR, EST NON AFRICAN AMERICAN: 88 mL/min/{1.73_m2} (ref >=60)
Globulin: 2.9 g/dL (calc) (ref 1.9–3.7)
Glucose, Bld: 90 mg/dL (ref 65–99)
Potassium: 4.9 mmol/L (ref 3.5–5.3)
Sodium: 139 mmol/L (ref 135–146)
TOTAL PROTEIN: 7.6 g/dL (ref 6.1–8.1)
Total Bilirubin: 0.4 mg/dL (ref 0.2–1.2)

## 2018-09-16 LAB — CLOSTRIDIUM DIFFICILE TOXIN B, QUALITATIVE, REAL-TIME PCR: Toxigenic C. Difficile by PCR: NOT DETECTED

## 2018-09-19 LAB — STOOL CULTURE
MICRO NUMBER:: 305765
MICRO NUMBER:: 305766
MICRO NUMBER:: 305767
SHIGA RESULT:: NOT DETECTED
SPECIMEN QUALITY: ADEQUATE
SPECIMEN QUALITY:: ADEQUATE
SPECIMEN QUALITY:: ADEQUATE

## 2018-10-13 ENCOUNTER — Encounter: Payer: Self-pay | Admitting: Family Medicine

## 2018-10-13 NOTE — Telephone Encounter (Signed)
Ok to refill??  Last office visit 09/15/2018.  Last refill 07/16/2018.

## 2018-10-14 MED ORDER — LISDEXAMFETAMINE DIMESYLATE 30 MG PO CAPS: 30.0000 mg | ORAL_CAPSULE | Freq: Every day | ORAL | 0 refills | Status: DC

## 2018-10-25 ENCOUNTER — Telehealth: Payer: Self-pay | Admitting: Certified Nurse Midwife

## 2018-10-25 NOTE — Telephone Encounter (Signed)
The patient called and stated that she would like to know what she is able to do about a string check the patient is uncomfortable with doing an IUD check via Tele-visit the patient also stated she did not have a menstrual cycle last month. Please advise.

## 2018-10-29 ENCOUNTER — Encounter: Payer: Self-pay | Admitting: Family Medicine

## 2018-11-16 ENCOUNTER — Encounter: Payer: Commercial Managed Care - PPO | Admitting: Certified Nurse Midwife

## 2018-12-23 ENCOUNTER — Encounter: Payer: Self-pay | Admitting: Family Medicine

## 2018-12-23 ENCOUNTER — Ambulatory Visit (INDEPENDENT_AMBULATORY_CARE_PROVIDER_SITE_OTHER): Payer: Commercial Managed Care - PPO | Admitting: Family Medicine

## 2018-12-23 ENCOUNTER — Other Ambulatory Visit: Payer: Self-pay

## 2018-12-23 VITALS — BP 134/80 | HR 108 | Temp 98.3°F | Resp 16 | Ht 63.0 in | Wt 209.0 lb

## 2018-12-23 DIAGNOSIS — N644 Mastodynia: Secondary | ICD-10-CM

## 2018-12-23 DIAGNOSIS — L304 Erythema intertrigo: Secondary | ICD-10-CM

## 2018-12-23 MED ORDER — CLOTRIMAZOLE-BETAMETHASONE 1-0.05 % EX CREA: 1.0000 "application " | TOPICAL_CREAM | Freq: Two times a day (BID) | CUTANEOUS | 0 refills | Status: DC

## 2018-12-23 NOTE — Progress Notes (Signed)
Subjective:    Patient ID: Kathryn Lozano, female    DOB: 26-Jun-1986, 33 y.o.   MRN: [id number]  HPI  Patient reports 2 months of pain in her left breast.  Pain is located in approximately 4 and 5:00 on the breast.  It radiates towards the left axilla.  She states that she feels a nodule at times.  It is extremely tender to touch.  The pain comes and goes.  She denies any nipple discharge.  She denies any rash.  She denies any skin changes in the breast.  There is no erythema or swelling.  She also reports an itchy rash in the crease between her abdominal pannus and her suprapubic area.  There are a few scattered erythematous bumps and a few scattered excoriations but nothing's dramatic.  She also states that she has a similar itchy rash in the intergluteal cleft but she defers allowing me to see that today. Past Medical History:  Diagnosis Date  . ADHD (attention deficit hyperactivity disorder)   . Anxiety   . Heartburn   . Migraines   . PONV (postoperative nausea and vomiting)    Pt reports nausea, "sensitive stomach"  . Renal disorder    KIDNEY STONES   Past Surgical History:  Procedure Laterality Date  . CESAREAN SECTION N/A 06/23/2017   Procedure: CESAREAN SECTION;  Surgeon: Brayton Mars, MD;  Location: [facility];  Service: Obstetrics;  Laterality: N/A;  . CHOLECYSTECTOMY N/A 09/14/2014   Procedure: LAPAROSCOPIC CHOLECYSTECTOMY;  Surgeon: Coralie Keens, MD;  Location: [place];  Service: General;  Laterality: N/A;  . CYSTOSCOPY W/ URETERAL STENT PLACEMENT Left 01/30/2016   Procedure: CYSTOSCOPY WITH STENT REPLACEMENT;  Surgeon: Hollice Espy, MD;  Location: [facility];  Service: Urology;  Laterality: Left;  . CYSTOSCOPY WITH STENT PLACEMENT  01/24/2016   Procedure: CYSTOSCOPY WITH STENT PLACEMENT;  Surgeon: Hollice Espy, MD;  Location: [facility];  Service: Urology;;  . LITHOTRIPSY  2005  . URETEROSCOPY  01/24/2016   Procedure: URETEROSCOPY;  Surgeon: Hollice Espy, MD;   Location: [facility];  Service: Urology;;  . URETEROSCOPY Left 01/30/2016   Procedure: URETEROSCOPY;  Surgeon: Hollice Espy, MD;  Location: [facility];  Service: Urology;  Laterality: Left;  . WISDOM TOOTH EXTRACTION     Current Outpatient Medications on File Prior to Visit  Medication Sig Dispense Refill  . levonorgestrel (MIRENA) 20 MCG/24HR IUD 1 each by Intrauterine route once.    . lisdexamfetamine (VYVANSE) 30 MG capsule Take 1 capsule (30 mg total) by mouth daily. 30 capsule 0  . montelukast (SINGULAIR) 10 MG tablet Take 1 tablet (10 mg total) by mouth daily. 90 tablet 3  . pantoprazole (PROTONIX) 40 MG tablet Take 1 tablet (40 mg total) by mouth daily. 90 tablet 3  . topiramate (TOPAMAX) 25 MG tablet TAKE 2 TABLETS (50 MG TOTAL) BY MOUTH 2 (TWO) TIMES DAILY. 360 tablet 2   No current facility-administered medications on file prior to visit.    Allergies  Allergen Reactions  . Roxicet [Oxycodone-Acetaminophen] Itching    Pt states that she is itching all over her body.    Social History   Socioeconomic History  . Marital status: Married    Spouse name: Not on file  . Number of children: Not on file  . Years of education: Not on file  . Highest education level: Not on file  Occupational History  . Not on file  Social Needs  . Financial resource strain: Not on file  .  Food insecurity    Worry: Not on file    Inability: Not on file  . Transportation needs    Medical: Not on file    Non-medical: Not on file  Tobacco Use  . Smoking status: Never Smoker  . Smokeless tobacco: Never Used  Substance and Sexual Activity  . Alcohol use: Yes    Comment: occasional   . Drug use: No  . Sexual activity: Not Currently    Birth control/protection: I.U.D.  Lifestyle  . Physical activity    Days per week: Not on file    Minutes per session: Not on file  . Stress: Not on file  Relationships  . Social Herbalist on phone: Not on file    Gets together: Not on file     Attends religious service: Not on file    Active member of club or organization: Not on file    Attends meetings of clubs or organizations: Not on file    Relationship status: Not on file  . Intimate partner violence    Fear of current or ex partner: Not on file    Emotionally abused: Not on file    Physically abused: Not on file    Forced sexual activity: Not on file  Other Topics Concern  . Not on file  Social History Narrative  . Not on file     Review of Systems  All other systems reviewed and are negative.      Objective:   Physical Exam  Constitutional: She appears well-developed and well-nourished. No distress.  HENT:  Mouth/Throat: Posterior oropharyngeal erythema present.  Neck: Neck supple.  Cardiovascular: Normal rate, regular rhythm and normal heart sounds.  No murmur heard. Pulmonary/Chest: Effort normal and breath sounds normal. No respiratory distress. She has no wheezes. She exhibits tenderness.    Abdominal: Soft. Bowel sounds are normal. She exhibits no distension. There is no abdominal tenderness.  Skin: Rash noted. She is not diaphoretic.     Vitals reviewed.         Assessment & Plan:  1. Mastalgia I do not appreciate any palpable abnormality on her breast exam.  I will schedule the patient for a breast ultrasound and will given the fact she feels like she can feel a nodule.  I do not feel a lump.  I suspect that the patient has fibrocystic changes causing pain. - US BREAST COMPLETE UNI LEFT INC AXILLA; Future  2. Intertrigo Visibly, the rash is mild.  I suspect intertrigo given the location.  Scabies would be less likely but can certainly cause itching and intertriginous area underneath her abdomen and in her gluteal cleft.  She will try Lotrisone cream twice daily and reassess in 10 to 14 days if no better or sooner if worse

## 2018-12-28 ENCOUNTER — Ambulatory Visit (INDEPENDENT_AMBULATORY_CARE_PROVIDER_SITE_OTHER): Payer: Commercial Managed Care - PPO | Admitting: Family Medicine

## 2018-12-28 ENCOUNTER — Other Ambulatory Visit: Payer: Self-pay | Admitting: Family Medicine

## 2018-12-28 ENCOUNTER — Other Ambulatory Visit: Payer: Self-pay

## 2018-12-28 DIAGNOSIS — A084 Viral intestinal infection, unspecified: Secondary | ICD-10-CM | POA: Diagnosis not present

## 2018-12-28 DIAGNOSIS — N644 Mastodynia: Secondary | ICD-10-CM

## 2018-12-28 MED ORDER — ONDANSETRON HCL 4 MG PO TABS: 4.0000 mg | ORAL_TABLET | Freq: Three times a day (TID) | ORAL | 0 refills | Status: DC | PRN

## 2018-12-28 NOTE — Progress Notes (Signed)
Subjective:    Patient ID: Kathryn Lozano, female    DOB: 1986/01/25, 33 y.o.   MRN: [id number]  HPI Patient is being seen today as a telephone visit.  Phone call began at 350.  Phone call concluded at 4.  Patient consents to be seen today via telephone.  Patient is currently at home.  I am currently in my office.  Patient states that on Friday, her son developed nausea vomiting and diarrhea.  Her son is a 28-year-old child in daycare.  He had no fever.  He had no cough.  He is since improved.  Sunday night she developed nausea and vomiting.  The vomiting improved after Sunday however she continues to have diarrhea.  She estimates that she is having 6-8 watery stools per day.  She denies any abdominal pain.  She denies any blood in her stool or bloody diarrhea.  She denies any fevers or chills or cough.  Her symptoms are slightly better today than they were on Monday. Past Medical History:  Diagnosis Date  . ADHD (attention deficit hyperactivity disorder)   . Anxiety   . Heartburn   . Migraines   . PONV (postoperative nausea and vomiting)    Pt reports nausea, "sensitive stomach"  . Renal disorder    KIDNEY STONES   Past Surgical History:  Procedure Laterality Date  . CESAREAN SECTION N/A 06/23/2017   Procedure: CESAREAN SECTION;  Surgeon: Brayton Mars, MD;  Location: [facility];  Service: Obstetrics;  Laterality: N/A;  . CHOLECYSTECTOMY N/A 09/14/2014   Procedure: LAPAROSCOPIC CHOLECYSTECTOMY;  Surgeon: Coralie Keens, MD;  Location: [place];  Service: General;  Laterality: N/A;  . CYSTOSCOPY W/ URETERAL STENT PLACEMENT Left 01/30/2016   Procedure: CYSTOSCOPY WITH STENT REPLACEMENT;  Surgeon: Hollice Espy, MD;  Location: [facility];  Service: Urology;  Laterality: Left;  . CYSTOSCOPY WITH STENT PLACEMENT  01/24/2016   Procedure: CYSTOSCOPY WITH STENT PLACEMENT;  Surgeon: Hollice Espy, MD;  Location: [facility];  Service: Urology;;  . LITHOTRIPSY  2005  . URETEROSCOPY  01/24/2016   Procedure: URETEROSCOPY;  Surgeon: Hollice Espy, MD;  Location: [facility];  Service: Urology;;  . URETEROSCOPY Left 01/30/2016   Procedure: URETEROSCOPY;  Surgeon: Hollice Espy, MD;  Location: [facility];  Service: Urology;  Laterality: Left;  . WISDOM TOOTH EXTRACTION     Current Outpatient Medications on File Prior to Visit  Medication Sig Dispense Refill  . clotrimazole-betamethasone (LOTRISONE) cream Apply 1 application topically 2 (two) times daily. 30 g 0  . levonorgestrel (MIRENA) 20 MCG/24HR IUD 1 each by Intrauterine route once.    . lisdexamfetamine (VYVANSE) 30 MG capsule Take 1 capsule (30 mg total) by mouth daily. 30 capsule 0  . montelukast (SINGULAIR) 10 MG tablet Take 1 tablet (10 mg total) by mouth daily. 90 tablet 3  . pantoprazole (PROTONIX) 40 MG tablet Take 1 tablet (40 mg total) by mouth daily. 90 tablet 3  . topiramate (TOPAMAX) 25 MG tablet TAKE 2 TABLETS (50 MG TOTAL) BY MOUTH 2 (TWO) TIMES DAILY. 360 tablet 2   No current facility-administered medications on file prior to visit.    Allergies  Allergen Reactions  . Roxicet [Oxycodone-Acetaminophen] Itching    Pt states that she is itching all over her body.    Social History   Socioeconomic History  . Marital status: Married    Spouse name: Not on file  . Number of children: Not on file  . Years of education: Not on file  .  Highest education level: Not on file  Occupational History  . Not on file  Social Needs  . Financial resource strain: Not on file  . Food insecurity    Worry: Not on file    Inability: Not on file  . Transportation needs    Medical: Not on file    Non-medical: Not on file  Tobacco Use  . Smoking status: Never Smoker  . Smokeless tobacco: Never Used  Substance and Sexual Activity  . Alcohol use: Yes    Comment: occasional   . Drug use: No  . Sexual activity: Not Currently    Birth control/protection: I.U.D.  Lifestyle  . Physical activity    Days per week: Not on file     Minutes per session: Not on file  . Stress: Not on file  Relationships  . Social Herbalist on phone: Not on file    Gets together: Not on file    Attends religious service: Not on file    Active member of club or organization: Not on file    Attends meetings of clubs or organizations: Not on file    Relationship status: Not on file  . Intimate partner violence    Fear of current or ex partner: Not on file    Emotionally abused: Not on file    Physically abused: Not on file    Forced sexual activity: Not on file  Other Topics Concern  . Not on file  Social History Narrative  . Not on file      Review of Systems  All other systems reviewed and are negative.      Objective:   Physical Exam No physical exam was performed today as patient was seen over the telephone       Assessment & Plan:  The encounter diagnosis was Viral gastroenteritis. Patient symptoms are consistent with a viral gastroenteritis.  I have recommended tincture of time.  I anticipate the symptoms will gradually improve over the next 48 hours.  I recommended that she push fluids such as Gatorade or Pedialyte and eat a bland diet such as bread, rice, applesauce, and toast.  She can use Zofran 4 mg every 8 hours as needed for nausea or vomiting.  She can use Imodium as directed on the box as needed for diarrhea.  If her symptoms improve tomorrow I believe she can return to work on Thursday.

## 2019-01-03 ENCOUNTER — Ambulatory Visit
Admission: RE | Admit: 2019-01-03 | Discharge: 2019-01-03 | Disposition: A | Discharge status: Home / Self Care | Payer: Commercial Managed Care - PPO | Source: Ambulatory Visit | Attending: Family Medicine | Admitting: Family Medicine

## 2019-01-03 ENCOUNTER — Other Ambulatory Visit: Payer: Self-pay | Admitting: Family Medicine

## 2019-01-03 ENCOUNTER — Other Ambulatory Visit: Payer: Self-pay

## 2019-01-03 DIAGNOSIS — N6489 Other specified disorders of breast: Secondary | ICD-10-CM

## 2019-01-03 DIAGNOSIS — N644 Mastodynia: Secondary | ICD-10-CM

## 2019-01-10 ENCOUNTER — Encounter: Payer: Commercial Managed Care - PPO | Admitting: Certified Nurse Midwife

## 2019-01-13 ENCOUNTER — Encounter: Payer: Self-pay | Admitting: Family Medicine

## 2019-01-13 MED ORDER — LISDEXAMFETAMINE DIMESYLATE 30 MG PO CAPS: 30.0000 mg | ORAL_CAPSULE | Freq: Every day | ORAL | 0 refills | Status: DC

## 2019-01-13 NOTE — Telephone Encounter (Signed)
Ok to refill??  Last office visit 12/28/2018  Last refill 10/14/2018.

## 2019-01-17 ENCOUNTER — Encounter: Payer: Self-pay | Admitting: Family Medicine

## 2019-01-28 ENCOUNTER — Encounter: Payer: Self-pay | Admitting: Certified Nurse Midwife

## 2019-01-28 ENCOUNTER — Ambulatory Visit (INDEPENDENT_AMBULATORY_CARE_PROVIDER_SITE_OTHER): Payer: Commercial Managed Care - PPO | Admitting: Certified Nurse Midwife

## 2019-01-28 ENCOUNTER — Other Ambulatory Visit: Payer: Self-pay

## 2019-01-28 VITALS — BP 99/72 | HR 68 | Ht 63.0 in | Wt 205.7 lb

## 2019-01-28 DIAGNOSIS — F439 Reaction to severe stress, unspecified: Secondary | ICD-10-CM

## 2019-01-28 DIAGNOSIS — N644 Mastodynia: Secondary | ICD-10-CM

## 2019-01-28 DIAGNOSIS — Z975 Presence of (intrauterine) contraceptive device: Secondary | ICD-10-CM

## 2019-01-28 DIAGNOSIS — Z01419 Encounter for gynecological examination (general) (routine) without abnormal findings: Secondary | ICD-10-CM | POA: Diagnosis not present

## 2019-01-28 DIAGNOSIS — B372 Candidiasis of skin and nail: Secondary | ICD-10-CM

## 2019-01-28 MED ORDER — VITAMIN E 180 MG (400 UNIT) PO CAPS: 400.0000 [IU] | ORAL_CAPSULE | Freq: Every day | ORAL | 1 refills | Status: DC

## 2019-01-28 MED ORDER — EVENING PRIMROSE OIL 500 MG PO CAPS: 1.0000 | ORAL_CAPSULE | Freq: Every day | ORAL | 1 refills | Status: DC

## 2019-01-28 MED ORDER — CLINDAMYCIN PHOSPHATE 1 % EX LOTN: TOPICAL_LOTION | Freq: Two times a day (BID) | CUTANEOUS | 0 refills | Status: DC

## 2019-01-28 MED ORDER — FLUCONAZOLE 150 MG PO TABS: 150.0000 mg | ORAL_TABLET | ORAL | 3 refills | Status: DC

## 2019-01-28 NOTE — Progress Notes (Signed)
Patient here for annual exam.  Patient c/o being under a lot of stress.

## 2019-01-28 NOTE — Patient Instructions (Addendum)
Skin Yeast Infection  A skin yeast infection is a condition in which there is an overgrowth of yeast (candida) that normally lives on the skin. This condition usually occurs in areas of the skin that are constantly warm and moist, such as the armpits or the groin. What are the causes? This condition is caused by a change in the normal balance of the yeast and bacteria that live on the skin. What increases the risk? You are more likely to develop this condition if you:  Are obese.  Are pregnant.  Take birth control pills.  Have diabetes.  Take antibiotic medicines.  Take steroid medicines.  Are malnourished.  Have a weak body defense system (immune system).  Are 11 years of age or older.  Wear tight clothing. What are the signs or symptoms? The most common symptom of this condition is itchiness in the affected area. Other symptoms include:  Red, swollen area of the skin.  Bumps on the skin. How is this diagnosed?  This condition is diagnosed with a medical history and physical exam.  Your health care provider may check for yeast by taking light scrapings of the skin to be viewed under a microscope. How is this treated? This condition is treated with medicine. Medicines may be prescribed or be available over the counter. The medicines may be:  Taken by mouth (orally).  Applied as a cream or powder to your skin. Follow these instructions at home:   Take or apply over-the-counter and prescription medicines only as told by your health care provider.  Maintain a healthy weight. If you need help losing weight, talk with your health care provider.  Keep your skin clean and dry.  If you have diabetes, keep your blood sugar under control.  Keep all follow-up visits as told by your health care provider. This is important. Contact a health care provider if:  Your symptoms go away and then return.  Your symptoms do not get better with treatment.  Your symptoms get  worse.  Your rash spreads.  You have a fever or chills.  You have new symptoms.  You have new warmth or redness of your skin. Summary  A skin yeast infection is a condition in which there is an overgrowth of yeast (candida) that normally lives on the skin. This condition is caused by a change in the normal balance of the yeast and bacteria that live on the skin.  Take or apply over-the-counter and prescription medicines only as told by your health care provider.  Keep your skin clean and dry.  Contact a health care provider if your symptoms do not get better with treatment. This information is not intended to replace advice given to you by your health care provider. Make sure you discuss any questions you have with your health care provider. Document Released: 03/11/2011 Document Revised: 11/10/2017 Document Reviewed: 11/10/2017 Elsevier Patient Education  [place].  Breast Tenderness Breast tenderness is a common problem for women of all ages. Breast tenderness may cause mild discomfort to severe pain. The pain usually comes and goes in association with your menstrual cycle, but it can be constant. Breast tenderness has many possible causes, including hormone changes and some medicines. Your health care provider may order tests, such as a mammogram or an ultrasound, to check for any unusual findings. Having breast tenderness usually does not mean that you have breast cancer. Follow these instructions at home: Sometimes, reassurance that you do not have breast cancer is all that  is needed. In general, follow these home care instructions: Managing pain and discomfort   If directed, apply ice to the area: ? Put ice in a plastic bag. ? Place a towel between your skin and the bag. ? Leave the ice on for 20 minutes, 2-3 times a day.  Make sure you are wearing a supportive bra, especially during exercise. You may also want to wear a supportive bra while sleeping if your breasts  are very tender. Medicines  Take over-the-counter and prescription medicines only as told by your health care provider. If the cause of your pain is infection, you may be prescribed an antibiotic medicine.  If you were prescribed an antibiotic, take it as told by your health care provider. Do not stop taking the antibiotic even if you start to feel better. General instructions   Your health care provider may recommend that you reduce the amount of fat in your diet. You can do this by: ? Limiting fried foods. ? Cooking foods using methods, such as baking, boiling, grilling, and broiling.  Decrease the amount of caffeine in your diet. You can do this by drinking more water and choosing caffeine-free options.  Keep a log of the days and times when your breasts are most tender.  Ask your health care provider how to do breast exams at home. This will help you notice if you have an unusual growth or lump. Contact a health care provider if:  Any part of your breast is hard, red, and hot to the touch. This may be a sign of infection.  You are not breastfeeding and you have fluid, especially blood or pus, coming out of your nipples.  You have a fever.  You have a new or painful lump in your breast that remains after your menstrual period ends.  Your pain does not improve or it gets worse.  Your pain is interfering with your daily activities. This information is not intended to replace advice given to you by your health care provider. Make sure you discuss any questions you have with your health care provider. Document Released: 06/05/2008 Document Revised: 06/05/2017 Document Reviewed: 03/21/2016 Elsevier Patient Education  2020 [place].  Generalized Anxiety Disorder, Adult Generalized anxiety disorder (GAD) is a mental health disorder. People with this condition constantly worry about everyday events. Unlike normal anxiety, worry related to GAD is not triggered by a specific event.  These worries also do not fade or get better with time. GAD interferes with life functions, including relationships, work, and school. GAD can vary from mild to severe. People with severe GAD can have intense waves of anxiety with physical symptoms (panic attacks). What are the causes? The exact cause of GAD is not known. What increases the risk? This condition is more likely to develop in:  Women.  People who have a family history of anxiety disorders.  People who are very shy.  People who experience very stressful life events, such as the death of a loved one.  People who have a very stressful family environment. What are the signs or symptoms? People with GAD often worry excessively about many things in their lives, such as their health and family. They may also be overly concerned about:  Doing well at work.  Being on time.  Natural disasters.  Friendships. Physical symptoms of GAD include:  Fatigue.  Muscle tension or having muscle twitches.  Trembling or feeling shaky.  Being easily startled.  Feeling like your heart is pounding or racing.  Feeling out of breath or like you cannot take a deep breath.  Having trouble falling asleep or staying asleep.  Sweating.  Nausea, diarrhea, or irritable bowel syndrome (IBS).  Headaches.  Trouble concentrating or remembering facts.  Restlessness.  Irritability. How is this diagnosed? Your health care provider can diagnose GAD based on your symptoms and medical history. You will also have a physical exam. The health care provider will ask specific questions about your symptoms, including how severe they are, when they started, and if they come and go. Your health care provider may ask you about your use of alcohol or drugs, including prescription medicines. Your health care provider may refer you to a mental health specialist for further evaluation. Your health care provider will do a thorough examination and may  perform additional tests to rule out other possible causes of your symptoms. To be diagnosed with GAD, a person must have anxiety that:  Is out of his or her control.  Affects several different aspects of his or her life, such as work and relationships.  Causes distress that makes him or her unable to take part in normal activities.  Includes at least three physical symptoms of GAD, such as restlessness, fatigue, trouble concentrating, irritability, muscle tension, or sleep problems. Before your health care provider can confirm a diagnosis of GAD, these symptoms must be present more days than they are not, and they must last for six months or longer. How is this treated? The following therapies are usually used to treat GAD:  Medicine. Antidepressant medicine is usually prescribed for long-term daily control. Antianxiety medicines may be added in severe cases, especially when panic attacks occur.  Talk therapy (psychotherapy). Certain types of talk therapy can be helpful in treating GAD by providing support, education, and guidance. Options include: ? Cognitive behavioral therapy (CBT). People learn coping skills and techniques to ease their anxiety. They learn to identify unrealistic or negative thoughts and behaviors and to replace them with positive ones. ? Acceptance and commitment therapy (ACT). This treatment teaches people how to be mindful as a way to cope with unwanted thoughts and feelings. ? Biofeedback. This process trains you to manage your body's response (physiological response) through breathing techniques and relaxation methods. You will work with a therapist while machines are used to monitor your physical symptoms.  Stress management techniques. These include yoga, meditation, and exercise. A mental health specialist can help determine which treatment is best for you. Some people see improvement with one type of therapy. However, other people require a combination of  therapies. Follow these instructions at home:  Take over-the-counter and prescription medicines only as told by your health care provider.  Try to maintain a normal routine.  Try to anticipate stressful situations and allow extra time to manage them.  Practice any stress management or self-calming techniques as taught by your health care provider.  Do not punish yourself for setbacks or for not making progress.  Try to recognize your accomplishments, even if they are small.  Keep all follow-up visits as told by your health care provider. This is important. Contact a health care provider if:  Your symptoms do not get better.  Your symptoms get worse.  You have signs of depression, such as: ? A persistently sad, cranky, or irritable mood. ? Loss of enjoyment in activities that used to bring you joy. ? Change in weight or eating. ? Changes in sleeping habits. ? Avoiding friends or family members. ? Loss of energy for  normal tasks. ? Feelings of guilt or worthlessness. Get help right away if:  You have serious thoughts about hurting yourself or others. If you ever feel like you may hurt yourself or others, or have thoughts about taking your own life, get help right away. You can go to your nearest emergency department or call:  Your local emergency services (911 in the [place]  A suicide crisis helpline, such as the [place] at 978 168 2790. This is open 24 hours a day. Summary  Generalized anxiety disorder (GAD) is a mental health disorder that involves worry that is not triggered by a specific event.  People with GAD often worry excessively about many things in their lives, such as their health and family.  GAD may cause physical symptoms such as restlessness, trouble concentrating, sleep problems, frequent sweating, nausea, diarrhea, headaches, and trembling or muscle twitching.  A mental health specialist can help determine which treatment is  best for you. Some people see improvement with one type of therapy. However, other people require a combination of therapies. This information is not intended to replace advice given to you by your health care provider. Make sure you discuss any questions you have with your health care provider. Document Released: 10/18/2012 Document Revised: 06/05/2017 Document Reviewed: 05/13/2016 Elsevier Patient Education  2020 [place].  Major Depressive Disorder, Adult Major depressive disorder (MDD) is a mental health condition. MDD often makes you feel sad, hopeless, or helpless. MDD can also cause symptoms in your body. MDD can affect your:  Work.  School.  Relationships.  Other normal activities. MDD can range from mild to very bad. It may occur once (single episode MDD). It can also occur many times (recurrent MDD). The main symptoms of MDD often include:  Feeling sad, depressed, or irritable most of the time.  Loss of interest. MDD symptoms also include:  Sleeping too much or too little.  Eating too much or too little.  A change in your weight.  Feeling tired (fatigue) or having low energy.  Feeling worthless.  Feeling guilty.  Trouble making decisions.  Trouble thinking clearly.  Thoughts of suicide or harming others.  Feeling weak.  Feeling agitated.  Keeping yourself from being around other people (isolation). Follow these instructions at home: Activity  Do these things as told by your doctor: ? Go back to your normal activities. ? Exercise regularly. ? Spend time outdoors. Alcohol  Talk with your doctor about how alcohol can affect your antidepressant medicines.  Do not drink alcohol. Or, limit how much alcohol you drink. ? This means no more than 1 drink a day for nonpregnant women and 2 drinks a day for men. One drink equals one of these:  12 oz of beer.  5 oz of wine.  1 oz of hard liquor. General instructions  Take over-the-counter and  prescription medicines only as told by your doctor.  Eat a healthy diet.  Get plenty of sleep.  Find activities that you enjoy. Make time to do them.  Think about joining a support group. Your doctor may be able to suggest a group for you.  Keep all follow-up visits as told by your doctor. This is important. Where to find more information:  [employer] on Mental Illness: ? www.nami.[place]: ? https://carter.com/  National Suicide Prevention Lifeline: ? 518-249-9517. This is free, 24-hour help. Contact a doctor if:  Your symptoms get worse.  You have new symptoms. Get help right away  if:  You self-harm.  You see, hear, taste, smell, or feel things that are not present (hallucinate). If you ever feel like you may hurt yourself or others, or have thoughts about taking your own life, get help right away. You can go to your nearest emergency department or call:  Your local emergency services (911 in the [place]  A suicide crisis helpline, such as the National Suicide Prevention Lifeline: ? 551-544-0149. This is open 24 hours a day. This information is not intended to replace advice given to you by your health care provider. Make sure you discuss any questions you have with your health care provider. Document Released: 06/04/2015 Document Revised: 06/05/2017 Document Reviewed: 03/09/2016 Elsevier Patient Education  2020 [employer].

## 2019-01-28 NOTE — Progress Notes (Signed)
ANNUAL PREVENTATIVE CARE GYN  ENCOUNTER NOTE  Subjective:       Kathryn Lozano is a 33 y.o. G51P1011 female here for a routine annual gynecologic exam.  Current complaints: 1. Skin yeast infection 2. Breast pain 3. Stress  Notes skin rash, questions yeast, treated by PCP with cream on 01/13/2019. Cream helps, but rash returns and itching increases through out the day.   Breast pain managed by PCP and referred for breast ultrasound and mammogram with follow up in six (6) months.   Notes increased stress due to separation from spouse, child custody dispute, and new work territory.   No SI/HI. Denies difficulty breathing or respiratory distress, chest pain, abdominal pain, excessive vaginal bleeding, dysuria, and leg pain or swelling.    Gynecologic History  Patient's last menstrual period was 01/22/2019 (exact date). Period Duration (Days): 4 Period Pattern: (!) Irregular Menstrual Flow: Moderate Menstrual Control: Tampon, Thin pad Dysmenorrhea: (!) Severe Dysmenorrhea Symptoms: Cramping  Contraception: IUD, Mirena  Last Pap: 11/2016. Results were: Negative/Negative  Last mammogram: 12/2018. Results were: BI-RADS 3  Obstetric History OB History  Gravida Para Term Preterm AB Living  2 1 1   1 1   SAB TAB Ectopic Multiple Live Births  1     0 1    # Outcome Date GA Lbr Len/2nd Weight Sex Delivery Anes PTL Lv  2 Term 06/23/17 [redacted]w[redacted]d / 04:53 8 lb 5 oz (3.77 kg) M CS-LVertical EPI  LIV     Birth Comments: none, abrasion on back of head, caput  1 SAB 2017 [redacted]w[redacted]d    SAB       Past Medical History:  Diagnosis Date  . ADHD (attention deficit hyperactivity disorder)   . Anxiety   . Heartburn   . Migraines   . PONV (postoperative nausea and vomiting)    Pt reports nausea, "sensitive stomach"  . Renal disorder    KIDNEY STONES    Past Surgical History:  Procedure Laterality Date  . CESAREAN SECTION N/A 06/23/2017   Procedure: CESAREAN SECTION;  Surgeon: Brayton Mars, MD;  Location: [facility];  Service: Obstetrics;  Laterality: N/A;  . CHOLECYSTECTOMY N/A 09/14/2014   Procedure: LAPAROSCOPIC CHOLECYSTECTOMY;  Surgeon: Coralie Keens, MD;  Location: [facility];  Service: General;  Laterality: N/A;  . CYSTOSCOPY W/ URETERAL STENT PLACEMENT Left 01/30/2016   Procedure: CYSTOSCOPY WITH STENT REPLACEMENT;  Surgeon: Hollice Espy, MD;  Location: [facility];  Service: Urology;  Laterality: Left;  . CYSTOSCOPY WITH STENT PLACEMENT  01/24/2016   Procedure: CYSTOSCOPY WITH STENT PLACEMENT;  Surgeon: Hollice Espy, MD;  Location: [facility];  Service: Urology;;  . LITHOTRIPSY  2005  . URETEROSCOPY  01/24/2016   Procedure: URETEROSCOPY;  Surgeon: Hollice Espy, MD;  Location: [facility];  Service: Urology;;  . URETEROSCOPY Left 01/30/2016   Procedure: URETEROSCOPY;  Surgeon: Hollice Espy, MD;  Location: [facility];  Service: Urology;  Laterality: Left;  . WISDOM TOOTH EXTRACTION      Current Outpatient Medications on File Prior to Visit  Medication Sig Dispense Refill  . clotrimazole-betamethasone (LOTRISONE) cream Apply 1 application topically 2 (two) times daily. 30 g 0  . levonorgestrel (MIRENA) 20 MCG/24HR IUD 1 each by Intrauterine route once.    . lisdexamfetamine (VYVANSE) 30 MG capsule Take 1 capsule (30 mg total) by mouth daily. 30 capsule 0  . montelukast (SINGULAIR) 10 MG tablet Take 1 tablet (10 mg total) by mouth daily. 90 tablet 3  . pantoprazole (PROTONIX) 40 MG tablet  Take 1 tablet (40 mg total) by mouth daily. 90 tablet 3  . topiramate (TOPAMAX) 25 MG tablet TAKE 2 TABLETS (50 MG TOTAL) BY MOUTH 2 (TWO) TIMES DAILY. 360 tablet 2   No current facility-administered medications on file prior to visit.     Allergies  Allergen Reactions  . Roxicet [Oxycodone-Acetaminophen] Itching    Pt states that she is itching all over her body.     Social History   Socioeconomic History  . Marital status: Married    Spouse name: Not on file  . Number of children:  Not on file  . Years of education: Not on file  . Highest education level: Not on file  Occupational History  . Not on file  Social Needs  . Financial resource strain: Not on file  . Food insecurity    Worry: Not on file    Inability: Not on file  . Transportation needs    Medical: Not on file    Non-medical: Not on file  Tobacco Use  . Smoking status: Never Smoker  . Smokeless tobacco: Never Used  Substance and Sexual Activity  . Alcohol use: Yes    Comment: occasional   . Drug use: No  . Sexual activity: Yes    Birth control/protection: I.U.D., Condom  Lifestyle  . Physical activity    Days per week: Not on file    Minutes per session: Not on file  . Stress: Not on file  Relationships  . Social Herbalist on phone: Not on file    Gets together: Not on file    Attends religious service: Not on file    Active member of club or organization: Not on file    Attends meetings of clubs or organizations: Not on file    Relationship status: Not on file  . Intimate partner violence    Fear of current or ex partner: Not on file    Emotionally abused: Not on file    Physically abused: Not on file    Forced sexual activity: Not on file  Other Topics Concern  . Not on file  Social History Narrative  . Not on file    Family History  Problem Relation Age of Onset  . Alzheimer's disease Paternal Uncle   . Thyroid cancer Paternal Uncle   . Alzheimer's disease Paternal Grandmother   . Kidney cancer Neg Hx   . Bladder Cancer Neg Hx   . Breast cancer Neg Hx   . Rashes / Skin problems Neg Hx   . Colon cancer Neg Hx   . Diabetes Neg Hx   . Ovarian cancer Neg Hx     The following portions of the patient's history were reviewed and updated as appropriate: allergies, current medications, past family history, past medical history, past social history, past surgical history and problem list.  Review of Systems  ROS negative except as noted above. Information obtained  from patient.    Objective:   BP 99/72   Pulse 68   Ht 5\' 3"  (1.6 m)   Wt 205 lb 11.2 oz (93.3 kg)   LMP 01/22/2019 (Exact Date)   Breastfeeding No   BMI 36.44 kg/m   CONSTITUTIONAL: Well-developed, well-nourished female in no acute distress.   PSYCHIATRIC: Normal mood and affect. Normal behavior. Normal judgment and thought content.  [place]: Alert and oriented to person, place, and time. Normal muscle tone coordination. No cranial nerve deficit noted.  HENT:  Normocephalic, atraumatic, External  right and left ear normal.   EYES: Conjunctivae and EOM are normal. Pupils are equal and round.   NECK: Normal range of motion, supple, no masses.  Normal thyroid.   SKIN: Skin is warm and dry. No rash noted. Not diaphoretic. No erythema. No pallor. Diffuse yeast rash to underarms, back, and panus.   CARDIOVASCULAR: Normal heart rate noted, regular rhythm, no murmur.  RESPIRATORY: Clear to auscultation bilaterally. Effort and breath sounds normal, no problems with respiration noted.  BREASTS: Symmetric in size. No masses, skin changes, nipple drainage, or lymphadenopathy. Pain with left breast palpation at 3 o'clock.   ABDOMEN: Soft, normal bowel sounds, no distention noted.  No tenderness, rebound or guarding. Obese.   PELVIC:  External Genitalia: Normal  BUS: Normal  Vagina: Normal  Cervix: Normal, IUD strings present  Uterus: Normal  Adnexa: Normal   MUSCULOSKELETAL: Normal range of motion. No tenderness.  No cyanosis, clubbing, or edema.  2+ distal pulses.  LYMPHATIC: No Axillary, Supraclavicular, or Inguinal Adenopathy.  Depression screen [facility] 2/9 01/28/2019 05/07/2018 10/22/2017 09/26/2016  Decreased Interest 2 0 0 0  Down, Depressed, Hopeless 2 0 0 0  PHQ - 2 Score 4 0 0 0  Altered sleeping 0 - - 0  Tired, decreased energy 3 - - 0  Change in appetite 2 - - 0  Feeling bad or failure about yourself  3 - - 0  Trouble concentrating 0 - - 0  Moving slowly or  fidgety/restless 0 - - 0  Suicidal thoughts 0 - - 0  PHQ-9 Score 12 - - 0  Difficult doing work/chores Somewhat difficult - - Not difficult at all   GAD 7 : Generalized Anxiety Score 01/28/2019  Nervous, Anxious, on Edge 0  Control/stop worrying 3  Worry too much - different things 3  Trouble relaxing 3  Restless 0  Easily annoyed or irritable 0  Afraid - awful might happen 2  Total GAD 7 Score 11  Anxiety Difficulty Extremely difficult     Assessment:   Annual gynecologic examination 33 y.o.   Contraception: IUD, Mirena   Obesity 2   Problem List Items Addressed This Visit    None    Visit Diagnoses    Well woman exam    -  Primary   Skin yeast infection       Relevant Medications   fluconazole (DIFLUCAN) 150 MG tablet   Mastalgia       Stress          Plan:   Pap: Not needed  Labs: Declined   Rx: Diflucan, Clindamycin lotion, Evening Primrose Oil, and Vitamin E; see orders   Routine preventative health maintenance measures emphasized: Exercise/Diet/Weight control, Tobacco Warnings, Alcohol/Substance use risks and Stress Management; see AVS  Encouraged patient to use EAP for counseling through work and contact with follow up plan  Reviewed red flag symptoms and when to call  RTC x 1 year for ANNUAL EXAM or sooner if needed   Diona Fanti, CNM [facility], [facility] 01/28/19 5:08 PM

## 2019-02-04 ENCOUNTER — Other Ambulatory Visit: Payer: Self-pay

## 2019-02-04 ENCOUNTER — Other Ambulatory Visit: Payer: Self-pay | Admitting: Family Medicine

## 2019-02-04 ENCOUNTER — Encounter: Payer: Self-pay | Admitting: Certified Nurse Midwife

## 2019-02-04 MED ORDER — CLOTRIMAZOLE-BETAMETHASONE 1-0.05 % EX CREA: 1.0000 "application " | TOPICAL_CREAM | Freq: Two times a day (BID) | CUTANEOUS | 0 refills | Status: DC

## 2019-02-11 ENCOUNTER — Other Ambulatory Visit: Payer: Self-pay | Admitting: *Deleted

## 2019-02-11 ENCOUNTER — Ambulatory Visit (INDEPENDENT_AMBULATORY_CARE_PROVIDER_SITE_OTHER): Payer: Commercial Managed Care - PPO | Admitting: Family Medicine

## 2019-02-11 ENCOUNTER — Other Ambulatory Visit: Payer: Self-pay

## 2019-02-11 DIAGNOSIS — R05 Cough: Secondary | ICD-10-CM

## 2019-02-11 DIAGNOSIS — Z20822 Contact with and (suspected) exposure to covid-19: Secondary | ICD-10-CM

## 2019-02-11 DIAGNOSIS — R059 Cough, unspecified: Secondary | ICD-10-CM

## 2019-02-11 NOTE — Progress Notes (Signed)
Subjective:    Patient ID: Kathryn Lozano, female    DOB: 1986/03/29, 33 y.o.   MRN: [id number]  HPI Patient is being seen as a telephone visit.  Phone call began at 1222.  Phone call concluded at 1230.  Patient consents to be seen via telephone.  She is currently home alone.  I am currently my office.  Patient states that she has had a sore throat for 2 days.  Began yesterday.  Her cough started 2 days ago.  Today she is running a low-grade fever 99.5-100.0.  She also reports chills.  She has some mild chest tightness this morning however that has improved and has subsided.  Now she feels okay other than a low-grade fever.  The cough is nonproductive.  She denies any hemoptysis.  She denies any pleurisy.  She denies any purulent sputum.  She denies any nausea or vomiting or diarrhea. Past Medical History:  Diagnosis Date  . ADHD (attention deficit hyperactivity disorder)   . Anxiety   . Heartburn   . Migraines   . PONV (postoperative nausea and vomiting)    Pt reports nausea, "sensitive stomach"  . Renal disorder    KIDNEY STONES   Past Surgical History:  Procedure Laterality Date  . CESAREAN SECTION N/A 06/23/2017   Procedure: CESAREAN SECTION;  Surgeon: Brayton Mars, MD;  Location: [facility];  Service: Obstetrics;  Laterality: N/A;  . CHOLECYSTECTOMY N/A 09/14/2014   Procedure: LAPAROSCOPIC CHOLECYSTECTOMY;  Surgeon: Coralie Keens, MD;  Location: [place];  Service: General;  Laterality: N/A;  . CYSTOSCOPY W/ URETERAL STENT PLACEMENT Left 01/30/2016   Procedure: CYSTOSCOPY WITH STENT REPLACEMENT;  Surgeon: Hollice Espy, MD;  Location: [facility];  Service: Urology;  Laterality: Left;  . CYSTOSCOPY WITH STENT PLACEMENT  01/24/2016   Procedure: CYSTOSCOPY WITH STENT PLACEMENT;  Surgeon: Hollice Espy, MD;  Location: [facility];  Service: Urology;;  . LITHOTRIPSY  2005  . URETEROSCOPY  01/24/2016   Procedure: URETEROSCOPY;  Surgeon: Hollice Espy, MD;  Location: [facility];   Service: Urology;;  . URETEROSCOPY Left 01/30/2016   Procedure: URETEROSCOPY;  Surgeon: Hollice Espy, MD;  Location: [facility];  Service: Urology;  Laterality: Left;  . WISDOM TOOTH EXTRACTION     Current Outpatient Medications on File Prior to Visit  Medication Sig Dispense Refill  . clindamycin (CLEOCIN T) 1 % lotion Apply topically 2 (two) times daily. 60 mL 0  . clotrimazole-betamethasone (LOTRISONE) cream APPLY TO AFFECTED AREA TWICE A DAY 30 g 0  . Evening Primrose Oil 500 MG CAPS Take 1 capsule (500 mg total) by mouth daily. 30 capsule 1  . fluconazole (DIFLUCAN) 150 MG tablet Take 1 tablet (150 mg total) by mouth every 3 (three) days. For three doses 3 tablet 3  . levonorgestrel (MIRENA) 20 MCG/24HR IUD 1 each by Intrauterine route once.    . lisdexamfetamine (VYVANSE) 30 MG capsule Take 1 capsule (30 mg total) by mouth daily. 30 capsule 0  . montelukast (SINGULAIR) 10 MG tablet Take 1 tablet (10 mg total) by mouth daily. 90 tablet 3  . pantoprazole (PROTONIX) 40 MG tablet Take 1 tablet (40 mg total) by mouth daily. 90 tablet 3  . topiramate (TOPAMAX) 25 MG tablet TAKE 2 TABLETS (50 MG TOTAL) BY MOUTH 2 (TWO) TIMES DAILY. 360 tablet 2  . vitamin E (VITAMIN E) 400 UNIT capsule Take 1 capsule (400 Units total) by mouth daily. 30 capsule 1   No current facility-administered medications on file prior  to visit.    Allergies  Allergen Reactions  . Roxicet [Oxycodone-Acetaminophen] Itching    Pt states that she is itching all over her body.    Social History   Socioeconomic History  . Marital status: Married    Spouse name: Not on file  . Number of children: Not on file  . Years of education: Not on file  . Highest education level: Not on file  Occupational History  . Not on file  Social Needs  . Financial resource strain: Not on file  . Food insecurity    Worry: Not on file    Inability: Not on file  . Transportation needs    Medical: Not on file    Non-medical: Not on  file  Tobacco Use  . Smoking status: Never Smoker  . Smokeless tobacco: Never Used  Substance and Sexual Activity  . Alcohol use: Yes    Comment: occasional   . Drug use: No  . Sexual activity: Yes    Birth control/protection: I.U.D., Condom  Lifestyle  . Physical activity    Days per week: Not on file    Minutes per session: Not on file  . Stress: Not on file  Relationships  . Social Herbalist on phone: Not on file    Gets together: Not on file    Attends religious service: Not on file    Active member of club or organization: Not on file    Attends meetings of clubs or organizations: Not on file    Relationship status: Not on file  . Intimate partner violence    Fear of current or ex partner: Not on file    Emotionally abused: Not on file    Physically abused: Not on file    Forced sexual activity: Not on file  Other Topics Concern  . Not on file  Social History Narrative  . Not on file      Review of Systems  All other systems reviewed and are negative.      Objective:   Physical Exam  Unable to perform a physical exam today due to the fact the patient is being seen via telephone however she is speaking in full and complete sentences with no obvious respiratory distress.  There is no labored breathing.  There is no audible congestion or difficulty speaking      Assessment & Plan:  The encounter diagnosis was Cough. Given the low-grade fever, the sore throat, and the cough, I suspect the patient has a viral upper respiratory infection.  I have recommended that she get tested for COVID-19.  I have ordered this at 1 of her outpatient testing sites.  I recommended symptomatic care including ibuprofen for fever and sore throat and Mucinex for chest congestion.  Should she develop shortness of breath over the weekend she needs to go to the emergency room for evaluation.  I have recommended that she quarantine and isolate for the next few days until we have  the results of her COVID test.

## 2019-02-13 ENCOUNTER — Other Ambulatory Visit: Payer: Self-pay

## 2019-02-13 ENCOUNTER — Ambulatory Visit
Admission: EM | Admit: 2019-02-13 | Discharge: 2019-02-13 | Disposition: A | Discharge status: Home / Self Care | Payer: Commercial Managed Care - PPO | Attending: Family Medicine | Admitting: Family Medicine

## 2019-02-13 ENCOUNTER — Encounter: Payer: Self-pay | Admitting: Emergency Medicine

## 2019-02-13 ENCOUNTER — Ambulatory Visit (INDEPENDENT_AMBULATORY_CARE_PROVIDER_SITE_OTHER): Payer: Commercial Managed Care - PPO

## 2019-02-13 DIAGNOSIS — M79675 Pain in left toe(s): Secondary | ICD-10-CM

## 2019-02-13 DIAGNOSIS — S90212A Contusion of left great toe with damage to nail, initial encounter: Secondary | ICD-10-CM

## 2019-02-13 DIAGNOSIS — W228XXA Striking against or struck by other objects, initial encounter: Secondary | ICD-10-CM | POA: Diagnosis not present

## 2019-02-13 LAB — NOVEL CORONAVIRUS, NAA: SARS-CoV-2, NAA: NOT DETECTED

## 2019-02-13 MED ORDER — TRAMADOL HCL 50 MG PO TABS: 50.0000 mg | ORAL_TABLET | Freq: Two times a day (BID) | ORAL | 0 refills | Status: DC | PRN

## 2019-02-13 NOTE — Discharge Instructions (Addendum)
It was very nice seeing you today in clinic. Thank you for entrusting me with your care.   Toe will continue to drain. Keep in dependant position. Soak in warm [place] salt water to help with pain/swelling and to promote continued drainage.   Make arrangements to follow up with your regular doctor in 1 week for re-evaluation if not improving.  If your symptoms/condition worsens, please seek follow up care either here or in the ER. Please remember, our [place] providers are "right here with you" when you need [place].   Again, it was my pleasure to take care of you today. Thank you for choosing our clinic. I hope that you start to feel better quickly.   Honor Loh, MSN, APRN, FNP-C, CEN Advanced Practice Provider [facility]

## 2019-02-13 NOTE — ED Triage Notes (Signed)
Patient states that she was lifting the trash bag out of her metal trash can and the metal trash landed on her left 1st toe yesterday.  Patient c/o redness, swelling and pain in her left 1st toe.

## 2019-02-13 NOTE — ED Provider Notes (Signed)
[place], [place]   Name: Kathryn Lozano DOB: Nov 27, 1985 MRN: [id number] CSN: [id number] PCP: Susy Frizzle, MD  Arrival date and time:  02/13/19 1005  Chief Complaint:  Toe Pain (left big toe)   NOTE: Prior to seeing the patient today, I have reviewed the triage nursing documentation and vital signs. Clinical staff has updated patient's PMH/PSHx, current medication list, and drug allergies/intolerances to ensure comprehensive history available to assist in medical decision making.   History:   HPI: Kathryn Lozano is a 33 y.o. female who presents today with complaints of pain in her LEFT great toe following blunt force trauma injury sustained on 02/12/2019.  Patient describes that the injury occurred when she was lifting an overfilled trash bag out of a metal trash can.  The trash can inadvertently was lifted off the ground as well when she attempted to remove the bag.  Metal trash can subsequently fell and struck her on her LEFT great toe.  Patient presents today with complaints of significant pain, swelling, and redness.  She notes that the pain was so bad last night that she was unable to sleep.  Patient took a single dose of Tylenol to help with her pain.  Past Medical History:  Diagnosis Date   ADHD (attention deficit hyperactivity disorder)    Anxiety    Heartburn    Migraines    PONV (postoperative nausea and vomiting)    Pt reports nausea, "sensitive stomach"   Renal disorder    KIDNEY STONES    Past Surgical History:  Procedure Laterality Date   CESAREAN SECTION N/A 06/23/2017   Procedure: CESAREAN SECTION;  Surgeon: Brayton Mars, MD;  Location: [facility];  Service: Obstetrics;  Laterality: N/A;   CHOLECYSTECTOMY N/A 09/14/2014   Procedure: LAPAROSCOPIC CHOLECYSTECTOMY;  Surgeon: Coralie Keens, MD;  Location: [place];  Service: General;  Laterality: N/A;   CYSTOSCOPY W/ URETERAL STENT PLACEMENT Left 01/30/2016   Procedure: CYSTOSCOPY WITH STENT  REPLACEMENT;  Surgeon: Hollice Espy, MD;  Location: [facility];  Service: Urology;  Laterality: Left;   CYSTOSCOPY WITH STENT PLACEMENT  01/24/2016   Procedure: CYSTOSCOPY WITH STENT PLACEMENT;  Surgeon: Hollice Espy, MD;  Location: [facility];  Service: Urology;;   LITHOTRIPSY  2005   URETEROSCOPY  01/24/2016   Procedure: URETEROSCOPY;  Surgeon: Hollice Espy, MD;  Location: [facility];  Service: Urology;;   URETEROSCOPY Left 01/30/2016   Procedure: URETEROSCOPY;  Surgeon: Hollice Espy, MD;  Location: [facility];  Service: Urology;  Laterality: Left;   WISDOM TOOTH EXTRACTION      Family History  Problem Relation Age of Onset   Alzheimer's disease Paternal Uncle    Thyroid cancer Paternal Uncle    Alzheimer's disease Paternal Grandmother    Kidney cancer Neg Hx    Bladder Cancer Neg Hx    Breast cancer Neg Hx    Rashes / Skin problems Neg Hx    Colon cancer Neg Hx    Diabetes Neg Hx    Ovarian cancer Neg Hx     Social History   Tobacco Use   Smoking status: Never Smoker   Smokeless tobacco: Never Used  Substance Use Topics   Alcohol use: Yes    Comment: occasional    Drug use: No    Patient Active Problem List   Diagnosis Date Noted   IUD (intrauterine device) in place 06/11/2018   Morbid obesity ([place]) 12/04/2016   Chronic cough 12/28/2012   Post-nasal drip 12/28/2012   Anxiety  Migraines    ADHD (attention deficit hyperactivity disorder)     Home Medications:    Current Meds  Medication Sig   Evening Primrose Oil 500 MG CAPS Take 1 capsule (500 mg total) by mouth daily.   levonorgestrel (MIRENA) 20 MCG/24HR IUD 1 each by Intrauterine route once.   lisdexamfetamine (VYVANSE) 30 MG capsule Take 1 capsule (30 mg total) by mouth daily.   montelukast (SINGULAIR) 10 MG tablet Take 1 tablet (10 mg total) by mouth daily.   pantoprazole (PROTONIX) 40 MG tablet Take 1 tablet (40 mg total) by mouth daily.   topiramate (TOPAMAX) 25 MG tablet  TAKE 2 TABLETS (50 MG TOTAL) BY MOUTH 2 (TWO) TIMES DAILY.   vitamin E (VITAMIN E) 400 UNIT capsule Take 1 capsule (400 Units total) by mouth daily.    Allergies:   Roxicet [oxycodone-acetaminophen]  Review of Systems (ROS): Review of Systems  Constitutional: Negative for chills and fever.  Respiratory: Negative for cough and shortness of breath.   Cardiovascular: Negative for chest pain and palpitations.  Musculoskeletal:       LEFT great toe pain secondary to blunt force trauma injury  Skin: Positive for color change (LEFT great toe).  Psychiatric/Behavioral: Positive for sleep disturbance (2/2 acute pain). The patient is nervous/anxious.   All other systems reviewed and are negative.    Vital Signs: Today's Vitals   02/13/19 1017 02/13/19 1019 02/13/19 1201 02/13/19 1205  BP:  111/82    Pulse:  79    Resp:  16    Temp:  97.9 F (36.6 C)    TempSrc:  Oral    SpO2:  100%    Weight: 207 lb (93.9 kg)     Height: 5\' 3"  (1.6 m)     PainSc: 5   0-No pain 0-No pain    Physical Exam: Physical Exam  Constitutional: She is oriented to person, place, and time and well-developed, well-nourished, and in no distress.  HENT:  Head: Normocephalic and atraumatic.  Mouth/Throat: Mucous membranes are normal.  Eyes: Pupils are equal, round, and reactive to light. EOM are normal.  Cardiovascular: Normal rate and intact distal pulses.  Pulmonary/Chest: Effort normal. No respiratory distress.  Musculoskeletal:     Left foot: Tenderness and swelling present.       Feet:  Neurological: She is alert and oriented to person, place, and time. Gait normal. GCS score is 15.  Skin: Skin is warm and dry. No rash noted.  Psychiatric: Mood, memory, affect and judgment normal.  Nursing note and vitals reviewed.   Urgent Care Treatments / Results:   LABS: PLEASE NOTE: all labs that were ordered this encounter are listed, however only abnormal results are displayed. Labs Reviewed - No data to  display  EKG: -None  RADIOLOGY: Dg Toe Great Left  Result Date: 02/13/2019 CLINICAL DATA:  33 year old female with history of pain in the great toe of the left foot after dropping a trash can on it yesterday. EXAM: LEFT GREAT TOE COMPARISON:  No priors. FINDINGS: There is no evidence of fracture or dislocation. There is no evidence of arthropathy or other focal bone abnormality. Soft tissues are unremarkable. IMPRESSION: Negative. Electronically Signed   By: Vinnie Langton M.D.   On: 02/13/2019 12:09    PROCEDURES: Incision and Drainage Performed by: Karen Kitchens, NP Authorized by: Karen Kitchens, NP   Consent:    Consent obtained:  Verbal   Consent given by:  Patient   Risks discussed:  Bleeding, incomplete drainage, pain and infection   Alternatives discussed:  No treatment, delayed treatment, observation and referral Location:    Type:  Subungual hematoma   Location: LEFT great toe. Pre-procedure details:    Skin preparation:  Antiseptic wash and Chloraprep Anesthesia (see MAR for exact dosages):    Anesthesia method:  None Procedure type:    Complexity:  Simple Procedure details:    Incision and drainage depth: Nail trephanation using    Drainage:  Bloody   Drainage amount:  Moderate   Wound treatment:  Wound left open Post-procedure details:    Patient tolerance of procedure:  Tolerated well, no immediate complications Comments:     Nail trephination using electrocautery.  2 bur holes placed and nail with moderate oozing. Patient did not experience pain relief. Asked attending physician Lacinda Axon, DO) to visualize wound.  DO explained to patient that areas would continue to drain with pressure and dependent positioning.  A third bur hole was placed using electrocautery by DO.   MEDICATIONS RECEIVED THIS VISIT: Medications - No data to display  PERTINENT CLINICAL COURSE NOTES/UPDATES:   Initial Impression / Assessment and Plan / Urgent Care Course:  Pertinent labs &  imaging results that were available during my care of the patient were personally reviewed by me and considered in my medical decision making (see lab/imaging section of note for values and interpretations).  Kathryn Lozano is a 33 y.o. female who presents to [facility] today with complaints of Toe Pain (left big toe)   Patient is well appearing overall in clinic today. She does not appear to be in any acute distress. Presenting symptoms (see HPI) and exam as documented above.  Diagnostic plain films revealed no evidence of acute fracture or dislocation.  Nail trephination to relieve subungual hematoma performed as documented above.  Discussed with patient that hematoma will continue to drain with dependent positioning and direct pressure.  She was advised that she will likely lose the nail on this toe.  Dry sterile pressure dressing applied in clinic by nursing staff.  Patient advised to continue APAP and/or IBU as needed for pain.  Will provide a short course of tramadol for more severe pain.  Discussed follow up with primary care physician in 1 week for re-evaluation. I have reviewed the follow up and strict return precautions for any new or worsening symptoms. Patient is aware of symptoms that would be deemed urgent/emergent, and would thus require further evaluation either here or in the emergency department. At the time of discharge, she verbalized understanding and consent with the discharge plan as it was reviewed with her. All questions were fielded by provider and/or clinic staff prior to patient discharge.    Final Clinical Impressions / Urgent Care Diagnoses:   Final diagnoses:  Subungual hematoma of great toe of left foot, initial encounter    New Prescriptions:  [place] Controlled Substance Registry consulted? Yes, I have consulted the [place] Controlled Substances Registry for this patient, and feel the risk/benefit ratio today is favorable for proceeding with this prescription for a  controlled substance.   Discussed use of controlled substance medication to treat her acute pain.  o Reviewed  STOP Act regulations  o Clinic does not refill controlled substances over the phone without face to face evaluation.   Safety precautions reviewed.  o Medications should not be bitten, chewed, crushed, shared, or taken with alcohol.  o Avoid use while working, driving, or operating heavy machinery.  o Side effects  associated with the use of this particular medication reviewed. - Patient understands that this medication can cause CNS depression, increase her risk of falls, and even lead to overdose that may result in death, if used outside of the parameters that she and I discussed.  With all of this in mind, she knowingly accepts the risks and responsibilities associated with intended course of treatment, and elects to responsibly proceed as discussed.  Meds ordered this encounter  Medications   traMADol (ULTRAM) 50 MG tablet    Sig: Take 1 tablet (50 mg total) by mouth every 12 (twelve) hours as needed.    Dispense:  8 tablet    Refill:  0    Recommended Follow up Care:  Patient encouraged to follow up with the following provider within the specified time frame, or sooner as dictated by the severity of her symptoms. As always, she was instructed that for any urgent/emergent care needs, she should seek care either here or in the emergency department for more immediate evaluation.  Follow-up Information    Susy Frizzle, MD.   Specialty: Family Medicine Why: As needed Contact information: [place] [place] [address] [phone]         NOTE: This note was prepared using Dragon dictation software along with smaller phrase technology. Despite my best ability to proofread, there is the potential that transcriptional errors may still occur from this process, and are completely unintentional.     Karen Kitchens, NP 02/13/19 1702

## 2019-02-14 ENCOUNTER — Other Ambulatory Visit: Payer: Self-pay

## 2019-02-14 MED ORDER — BUPROPION HCL ER (XL) 150 MG PO TB24: 150.0000 mg | ORAL_TABLET | Freq: Every day | ORAL | 1 refills | Status: DC

## 2019-02-16 ENCOUNTER — Encounter: Payer: Self-pay | Admitting: Family Medicine

## 2019-02-17 ENCOUNTER — Encounter: Payer: Self-pay | Admitting: Family Medicine

## 2019-02-17 MED ORDER — LISDEXAMFETAMINE DIMESYLATE 30 MG PO CAPS: 30.0000 mg | ORAL_CAPSULE | Freq: Every day | ORAL | 0 refills | Status: DC

## 2019-02-17 NOTE — Telephone Encounter (Signed)
Requesting refill    Vyvanse  LOV: 02/11/19  LRF:  01/13/19

## 2019-02-23 ENCOUNTER — Telehealth: Payer: Self-pay | Admitting: Family Medicine

## 2019-02-23 NOTE — Telephone Encounter (Signed)
PA Submitted through [email or web address] and received the following:  ACQPEA:[id number];AQVOHC:[id number];Review Type:Prior Auth;Coverage Start Date:01/24/2019;Coverage End Date:02/23/2020;  When pt called the ins they stated that it was over the limit??? (thinking maybe pharmacy sent in incorrectly)

## 2019-02-25 ENCOUNTER — Other Ambulatory Visit: Payer: Self-pay | Admitting: Certified Nurse Midwife

## 2019-03-01 ENCOUNTER — Ambulatory Visit: Payer: Commercial Managed Care - PPO | Admitting: Certified Nurse Midwife

## 2019-03-01 ENCOUNTER — Other Ambulatory Visit: Payer: Self-pay

## 2019-03-01 ENCOUNTER — Encounter: Payer: Self-pay | Admitting: Certified Nurse Midwife

## 2019-03-01 VITALS — BP 102/77 | HR 84 | Ht 63.0 in | Wt 203.5 lb

## 2019-03-01 DIAGNOSIS — F419 Anxiety disorder, unspecified: Secondary | ICD-10-CM

## 2019-03-01 MED ORDER — BUPROPION HCL ER (XL) 150 MG PO TB24: 150.0000 mg | ORAL_TABLET | Freq: Every day | ORAL | 3 refills | Status: DC

## 2019-03-01 NOTE — Progress Notes (Signed)
GYN ENCOUNTER NOTE  Subjective:       Kathryn Lozano is a 33 y.o. G82P1011 female here for medication check.   Started Wellbutrin XL 150 mg daily for anxiety after Annual exam in July.   Feeling better when she "remembers to take medication". In the middle of divorce and child custody battle. Resigned from current job and returning to work a [employer]	[place] next week.   No SI/HI. Denies difficulty breathing or respiratory distress, chest pain, abdominal pain, excessive vaginal bleeding, dysuria, and leg pain or swelling.    Gynecologic History  Patient's last menstrual period was 02/26/2019 (exact date). Period Duration (Days): 8 Period Pattern: (!) Irregular Menstrual Flow: Light Menstrual Control: Thin pad Dysmenorrhea: (!) Severe Dysmenorrhea Symptoms: Cramping  Contraception: IUD, Mirena  Last Pap: 11/2016. Results were: Negative/Negative  Obstetric History  OB History  Gravida Para Term Preterm AB Living  2 1 1   1 1   SAB TAB Ectopic Multiple Live Births  1     0 1    # Outcome Date GA Lbr Len/2nd Weight Sex Delivery Anes PTL Lv  2 Term 06/23/17 [redacted]w[redacted]d / 04:53 8 lb 5 oz (3.77 kg) M CS-LVertical EPI  LIV     Birth Comments: none, abrasion on back of head, caput  1 SAB 2017 [redacted]w[redacted]d    SAB       Past Medical History:  Diagnosis Date  . ADHD (attention deficit hyperactivity disorder)   . Anxiety   . Heartburn   . Migraines   . PONV (postoperative nausea and vomiting)    Pt reports nausea, "sensitive stomach"  . Renal disorder    KIDNEY STONES    Past Surgical History:  Procedure Laterality Date  . CESAREAN SECTION N/A 06/23/2017   Procedure: CESAREAN SECTION;  Surgeon: Brayton Mars, MD;  Location: [facility];  Service: Obstetrics;  Laterality: N/A;  . CHOLECYSTECTOMY N/A 09/14/2014   Procedure: LAPAROSCOPIC CHOLECYSTECTOMY;  Surgeon: Coralie Keens, MD;  Location: [facility];  Service: General;  Laterality: N/A;  . CYSTOSCOPY W/ URETERAL STENT PLACEMENT Left  01/30/2016   Procedure: CYSTOSCOPY WITH STENT REPLACEMENT;  Surgeon: Hollice Espy, MD;  Location: [facility];  Service: Urology;  Laterality: Left;  . CYSTOSCOPY WITH STENT PLACEMENT  01/24/2016   Procedure: CYSTOSCOPY WITH STENT PLACEMENT;  Surgeon: Hollice Espy, MD;  Location: [facility];  Service: Urology;;  . LITHOTRIPSY  2005  . URETEROSCOPY  01/24/2016   Procedure: URETEROSCOPY;  Surgeon: Hollice Espy, MD;  Location: [facility];  Service: Urology;;  . URETEROSCOPY Left 01/30/2016   Procedure: URETEROSCOPY;  Surgeon: Hollice Espy, MD;  Location: [facility];  Service: Urology;  Laterality: Left;  . WISDOM TOOTH EXTRACTION      Current Outpatient Medications on File Prior to Visit  Medication Sig Dispense Refill  . [employer] VITAMIN E 400 units capsule TAKE 1 CAPSULE (400 UNITS TOTAL) BY MOUTH DAILY. 30 capsule 1  . Evening Primrose Oil 500 MG CAPS Take 1 capsule (500 mg total) by mouth daily. 30 capsule 1  . levonorgestrel (MIRENA) 20 MCG/24HR IUD 1 each by Intrauterine route once.    . lisdexamfetamine (VYVANSE) 30 MG capsule Take 1 capsule (30 mg total) by mouth daily. 30 capsule 0  . montelukast (SINGULAIR) 10 MG tablet Take 1 tablet (10 mg total) by mouth daily. 90 tablet 3  . pantoprazole (PROTONIX) 40 MG tablet Take 1 tablet (40 mg total) by mouth daily. 90 tablet 3  . topiramate (TOPAMAX) 25 MG tablet  TAKE 2 TABLETS (50 MG TOTAL) BY MOUTH 2 (TWO) TIMES DAILY. 360 tablet 2   No current facility-administered medications on file prior to visit.     Allergies  Allergen Reactions  . Roxicet [Oxycodone-Acetaminophen] Itching    Pt states that she is itching all over her body.     Social History   Socioeconomic History  . Marital status: Married    Spouse name: Not on file  . Number of children: Not on file  . Years of education: Not on file  . Highest education level: Not on file  Occupational History  . Not on file  Social Needs  . Financial resource strain: Not on file  . Food  insecurity    Worry: Not on file    Inability: Not on file  . Transportation needs    Medical: Not on file    Non-medical: Not on file  Tobacco Use  . Smoking status: Never Smoker  . Smokeless tobacco: Never Used  Substance and Sexual Activity  . Alcohol use: Yes    Comment: occasional   . Drug use: No  . Sexual activity: Not Currently    Birth control/protection: I.U.D., Condom  Lifestyle  . Physical activity    Days per week: Not on file    Minutes per session: Not on file  . Stress: Not on file  Relationships  . Social Herbalist on phone: Not on file    Gets together: Not on file    Attends religious service: Not on file    Active member of club or organization: Not on file    Attends meetings of clubs or organizations: Not on file    Relationship status: Not on file  . Intimate partner violence    Fear of current or ex partner: Not on file    Emotionally abused: Not on file    Physically abused: Not on file    Forced sexual activity: Not on file  Other Topics Concern  . Not on file  Social History Narrative  . Not on file    Family History  Problem Relation Age of Onset  . Alzheimer's disease Paternal Uncle   . Thyroid cancer Paternal Uncle   . Alzheimer's disease Paternal Grandmother   . Kidney cancer Neg Hx   . Bladder Cancer Neg Hx   . Breast cancer Neg Hx   . Rashes / Skin problems Neg Hx   . Colon cancer Neg Hx   . Diabetes Neg Hx   . Ovarian cancer Neg Hx     The following portions of the patient's history were reviewed and updated as appropriate: allergies, current medications, past family history, past medical history, past social history, past surgical history and problem list.  Review of Systems  ROS negative except as noted above. Information obtained from patient.   Objective:   BP 102/77   Pulse 84   Ht 5\' 3"  (1.6 m)   Wt 203 lb 8 oz (92.3 kg)   LMP 02/26/2019 (Exact Date)   BMI 36.05 kg/m   CONSTITUTIONAL:  Well-developed, well-nourished female in no acute distress.   Depression screen [facility] 2/9 03/01/2019 01/28/2019 05/07/2018 10/22/2017 09/26/2016  Decreased Interest 0 2 0 0 0  Down, Depressed, Hopeless 0 2 0 0 0  PHQ - 2 Score 0 4 0 0 0  Altered sleeping 0 0 - - 0  Tired, decreased energy 1 3 - - 0  Change in appetite 1 2 - -  0  Feeling bad or failure about yourself  1 3 - - 0  Trouble concentrating 0 0 - - 0  Moving slowly or fidgety/restless 0 0 - - 0  Suicidal thoughts 0 0 - - 0  PHQ-9 Score 3 12 - - 0  Difficult doing work/chores Not difficult at all Somewhat difficult - - Not difficult at all   GAD 7 : Generalized Anxiety Score 03/01/2019 01/28/2019  Nervous, Anxious, on Edge 1 0  Control/stop worrying 1 3  Worry too much - different things 1 3  Trouble relaxing 1 3  Restless 0 0  Easily annoyed or irritable 0 0  Afraid - awful might happen 0 2  Total GAD 7 Score 4 11  Anxiety Difficulty Somewhat difficult Extremely difficult    Assessment:   1. Anxiety  Plan:   Continue medications as prescribed.   Rx Wellbutrin, see orders.   Reviewed red flag symptoms and when to call.   RTC as needed.    Diona Fanti, CNM [facility], [facility] 03/01/19 3:38 PM

## 2019-03-01 NOTE — Progress Notes (Signed)
Patient here for medication follow-up.  Patient states "I just have to remember to take it".

## 2019-03-01 NOTE — Patient Instructions (Signed)
Preventive Care 21-33 Years Old, Female Preventive care refers to visits with your health care provider and lifestyle choices that can promote health and wellness. This includes:  A yearly physical exam. This may also be called an annual well check.  Regular dental visits and eye exams.  Immunizations.  Screening for certain conditions.  Healthy lifestyle choices, such as eating a healthy diet, getting regular exercise, not using drugs or products that contain nicotine and tobacco, and limiting alcohol use. What can I expect for my preventive care visit? Physical exam Your health care provider will check your:  Height and weight. This may be used to calculate body mass index (BMI), which tells if you are at a healthy weight.  Heart rate and blood pressure.  Skin for abnormal spots. Counseling Your health care provider may ask you questions about your:  Alcohol, tobacco, and drug use.  Emotional well-being.  Home and relationship well-being.  Sexual activity.  Eating habits.  Work and work environment.  Method of birth control.  Menstrual cycle.  Pregnancy history. What immunizations do I need?  Influenza (flu) vaccine  This is recommended every year. Tetanus, diphtheria, and pertussis (Tdap) vaccine  You may need a Td booster every 10 years. Varicella (chickenpox) vaccine  You may need this if you have not been vaccinated. Human papillomavirus (HPV) vaccine  If recommended by your health care provider, you may need three doses over 6 months. Measles, mumps, and rubella (MMR) vaccine  You may need at least one dose of MMR. You may also need a second dose. Meningococcal conjugate (MenACWY) vaccine  One dose is recommended if you are age 19-21 years and a first-year college student living in a residence hall, or if you have one of several medical conditions. You may also need additional booster doses. Pneumococcal conjugate (PCV13) vaccine  You may need  this if you have certain conditions and were not previously vaccinated. Pneumococcal polysaccharide (PPSV23) vaccine  You may need one or two doses if you smoke cigarettes or if you have certain conditions. Hepatitis A vaccine  You may need this if you have certain conditions or if you travel or work in places where you may be exposed to hepatitis A. Hepatitis B vaccine  You may need this if you have certain conditions or if you travel or work in places where you may be exposed to hepatitis B. Haemophilus influenzae type b (Hib) vaccine  You may need this if you have certain conditions. You may receive vaccines as individual doses or as more than one vaccine together in one shot (combination vaccines). Talk with your health care provider about the risks and benefits of combination vaccines. What tests do I need?  Blood tests  Lipid and cholesterol levels. These may be checked every 5 years starting at age 20.  Hepatitis C test.  Hepatitis B test. Screening  Diabetes screening. This is done by checking your blood sugar (glucose) after you have not eaten for a while (fasting).  Sexually transmitted disease (STD) testing.  BRCA-related cancer screening. This may be done if you have a family history of breast, ovarian, tubal, or peritoneal cancers.  Pelvic exam and Pap test. This may be done every 3 years starting at age 21. Starting at age 30, this may be done every 5 years if you have a Pap test in combination with an HPV test. Talk with your health care provider about your test results, treatment options, and if necessary, the need for more tests.   Follow these instructions at home: Eating and drinking   Eat a diet that includes fresh fruits and vegetables, whole grains, lean protein, and low-fat dairy.  Take vitamin and mineral supplements as recommended by your health care provider.  Do not drink alcohol if: ? Your health care provider tells you not to drink. ? You are  pregnant, may be pregnant, or are planning to become pregnant.  If you drink alcohol: ? Limit how much you have to 0-1 drink a day. ? Be aware of how much alcohol is in your drink. In the [place] one drink equals one 12 oz bottle of beer (355 mL), one 5 oz glass of wine (148 mL), or one 1 oz glass of hard liquor (44 mL). Lifestyle  Take daily care of your teeth and gums.  Stay active. Exercise for at least 30 minutes on 5 or more days each week.  Do not use any products that contain nicotine or tobacco, such as cigarettes, e-cigarettes, and chewing tobacco. If you need help quitting, ask your health care provider.  If you are sexually active, practice safe sex. Use a condom or other form of birth control (contraception) in order to prevent pregnancy and STIs (sexually transmitted infections). If you plan to become pregnant, see your health care provider for a preconception visit. What's next?  Visit your health care provider once a year for a well check visit.  Ask your health care provider how often you should have your eyes and teeth checked.  Stay up to date on all vaccines. This information is not intended to replace advice given to you by your health care provider. Make sure you discuss any questions you have with your health care provider. Document Released: 08/19/2001 Document Revised: 03/04/2018 Document Reviewed: 03/04/2018 Elsevier Patient Education  [place]. Bupropion extended-release tablets (Depression/Mood Disorders) What is this medicine? BUPROPION (byoo PROE pee on) is used to treat depression. This medicine may be used for other purposes; ask your health care provider or pharmacist if you have questions. COMMON BRAND NAME(S): Aplenzin, Budeprion XL, Forfivo XL, Wellbutrin XL What should I tell my health care provider before I take this medicine? They need to know if you have any of these conditions:  an eating disorder, such as anorexia or bulimia  bipolar  disorder or psychosis  diabetes or high blood sugar, treated with medication  glaucoma  head injury or brain tumor  heart disease, previous heart attack, or irregular heart beat  high blood pressure  kidney or liver disease  seizures (convulsions)  suicidal thoughts or a previous suicide attempt  Tourette's syndrome  weight loss  an unusual or allergic reaction to bupropion, other medicines, foods, dyes, or preservatives  breast-feeding  pregnant or trying to become pregnant How should I use this medicine? Take this medicine by mouth with a glass of water. Follow the directions on the prescription label. You can take it with or without food. If it upsets your stomach, take it with food. Do not crush, chew, or cut these tablets. This medicine is taken once daily at the same time each day. Do not take your medicine more often than directed. Do not stop taking this medicine suddenly except upon the advice of your doctor. Stopping this medicine too quickly may cause serious side effects or your condition may worsen. A special MedGuide will be given to you by the pharmacist with each prescription and refill. Be sure to read this information carefully each time. Talk to your  pediatrician regarding the use of this medicine in children. Special care may be needed. Overdosage: If you think you have taken too much of this medicine contact a poison control center or emergency room at once. NOTE: This medicine is only for you. Do not share this medicine with others. What if I miss a dose? If you miss a dose, skip the missed dose and take your next tablet at the regular time. Do not take double or extra doses. What may interact with this medicine? Do not take this medicine with any of the following medications:  linezolid  MAOIs like Azilect, Carbex, Eldepryl, Marplan, Nardil, and Parnate  methylene blue (injected into a vein)  other medicines that contain bupropion like Zyban This  medicine may also interact with the following medications:  alcohol  certain medicines for anxiety or sleep  certain medicines for blood pressure like metoprolol, propranolol  certain medicines for depression or psychotic disturbances  certain medicines for HIV or AIDS like efavirenz, lopinavir, nelfinavir, ritonavir  certain medicines for irregular heart beat like propafenone, flecainide  certain medicines for Parkinson's disease like amantadine, levodopa  certain medicines for seizures like carbamazepine, phenytoin, phenobarbital  cimetidine  clopidogrel  cyclophosphamide  digoxin  furazolidone  isoniazid  nicotine  orphenadrine  procarbazine  steroid medicines like prednisone or cortisone  stimulant medicines for attention disorders, weight loss, or to stay awake  tamoxifen  theophylline  thiotepa  ticlopidine  tramadol  warfarin This list may not describe all possible interactions. Give your health care provider a list of all the medicines, herbs, non-prescription drugs, or dietary supplements you use. Also tell them if you smoke, drink alcohol, or use illegal drugs. Some items may interact with your medicine. What should I watch for while using this medicine? Tell your doctor if your symptoms do not get better or if they get worse. Visit your doctor or healthcare provider for regular checks on your progress. Because it may take several weeks to see the full effects of this medicine, it is important to continue your treatment as prescribed by your doctor. This medicine may cause serious skin reactions. They can happen weeks to months after starting the medicine. Contact your healthcare provider right away if you notice fevers or flu-like symptoms with a rash. The rash may be red or purple and then turn into blisters or peeling of the skin. Or, you might notice a red rash with swelling of the face, lips or lymph nodes in your neck or under your arms. Patients  and their families should watch out for new or worsening thoughts of suicide or depression. Also watch out for sudden changes in feelings such as feeling anxious, agitated, panicky, irritable, hostile, aggressive, impulsive, severely restless, overly excited and hyperactive, or not being able to sleep. If this happens, especially at the beginning of treatment or after a change in dose, call your healthcare provider. Avoid alcoholic drinks while taking this medicine. Drinking large amounts of alcoholic beverages, using sleeping or anxiety medicines, or quickly stopping the use of these agents while taking this medicine may increase your risk for a seizure. Do not drive or use heavy machinery until you know how this medicine affects you. This medicine can impair your ability to perform these tasks. Do not take this medicine close to bedtime. It may prevent you from sleeping. Your mouth may get dry. Chewing sugarless gum or sucking hard candy, and drinking plenty of water may help. Contact your doctor if the problem does not  go away or is severe. The tablet shell for some brands of this medicine does not dissolve. This is normal. The tablet shell may appear whole in the stool. This is not a cause for concern. What side effects may I notice from receiving this medicine? Side effects that you should report to your doctor or health care professional as soon as possible:  allergic reactions like skin rash, itching or hives, swelling of the face, lips, or tongue  breathing problems  changes in vision  confusion  elevated mood, decreased need for sleep, racing thoughts, impulsive behavior  fast or irregular heartbeat  hallucinations, loss of contact with reality  increased blood pressure  rash, fever, and swollen lymph nodes  redness, blistering, peeling or loosening of the skin, including inside the mouth  seizures  suicidal thoughts or other mood changes  unusually weak or tired  vomiting  Side effects that usually do not require medical attention (report to your doctor or health care professional if they continue or are bothersome):  constipation  headache  loss of appetite  nausea  tremors  weight loss This list may not describe all possible side effects. Call your doctor for medical advice about side effects. You may report side effects to FDA at 1-800-FDA-1088. Where should I keep my medicine? Keep out of the reach of children. Store at room temperature between 15 and 30 degrees C (59 and 86 degrees F). Throw away any unused medicine after the expiration date. NOTE: This sheet is a summary. It may not cover all possible information. If you have questions about this medicine, talk to your doctor, pharmacist, or health care provider.  2020 Elsevier/Gold Standard (2018-09-16 13:45:31)

## 2019-03-02 ENCOUNTER — Encounter: Payer: Self-pay | Admitting: Certified Nurse Midwife

## 2019-03-26 ENCOUNTER — Other Ambulatory Visit: Payer: Self-pay | Admitting: Certified Nurse Midwife

## 2019-04-11 ENCOUNTER — Encounter: Payer: Commercial Managed Care - PPO | Admitting: Certified Nurse Midwife

## 2019-04-13 ENCOUNTER — Other Ambulatory Visit: Payer: Self-pay | Admitting: Family Medicine

## 2019-04-27 ENCOUNTER — Other Ambulatory Visit: Payer: Self-pay

## 2019-04-27 ENCOUNTER — Emergency Department (HOSPITAL_COMMUNITY)
Admission: EM | Admit: 2019-04-27 | Discharge: 2019-04-27 | Disposition: A | Discharge status: Home / Self Care | Payer: 59 | Attending: Emergency Medicine | Admitting: Emergency Medicine

## 2019-04-27 ENCOUNTER — Encounter (HOSPITAL_COMMUNITY): Payer: Self-pay | Admitting: Emergency Medicine

## 2019-04-27 DIAGNOSIS — R05 Cough: Secondary | ICD-10-CM | POA: Insufficient documentation

## 2019-04-27 DIAGNOSIS — Z20828 Contact with and (suspected) exposure to other viral communicable diseases: Secondary | ICD-10-CM | POA: Diagnosis not present

## 2019-04-27 DIAGNOSIS — Z20822 Contact with and (suspected) exposure to covid-19: Secondary | ICD-10-CM

## 2019-04-27 DIAGNOSIS — R519 Headache, unspecified: Secondary | ICD-10-CM | POA: Diagnosis not present

## 2019-04-27 DIAGNOSIS — Z79899 Other long term (current) drug therapy: Secondary | ICD-10-CM | POA: Diagnosis not present

## 2019-04-27 NOTE — Discharge Instructions (Signed)
Return to the ED with any concerns including difficulty breathing, vomiting and not able to keep down liquids, decreased urine output, decreased level of alertness/lethargy, or any other alarming symptoms  °

## 2019-04-27 NOTE — ED Provider Notes (Signed)
[facility] Provider Note   CSN: [id number] Arrival date & time: 04/27/19  1926     History   Chief Complaint Chief Complaint  Patient presents with  . Covid Exposure    HPI Kathryn Lozano is a 33 y.o. female.     HPI  Pt presenting with c/o exposure to covid19.  She states she was with a family member approx 3 days ago who tested positive for covid today.  Pt has had some cough for the past 3 weeks.  Only new symptoms since the exposure is mild frontal headache.  No fever, no difficulty breathing.  There are no other associated systemic symptoms, there are no other alleviating or modifying factors.   Past Medical History:  Diagnosis Date  . ADHD (attention deficit hyperactivity disorder)   . Anxiety   . Heartburn   . Migraines   . PONV (postoperative nausea and vomiting)    Pt reports nausea, "sensitive stomach"  . Renal disorder    KIDNEY STONES    Patient Active Problem List   Diagnosis Date Noted  . IUD (intrauterine device) in place 06/11/2018  . Morbid obesity ([place]) 12/04/2016  . Chronic cough 12/28/2012  . Post-nasal drip 12/28/2012  . Anxiety   . Migraines   . ADHD (attention deficit hyperactivity disorder)     Past Surgical History:  Procedure Laterality Date  . CESAREAN SECTION N/A 06/23/2017   Procedure: CESAREAN SECTION;  Surgeon: Brayton Mars, MD;  Location: [facility];  Service: Obstetrics;  Laterality: N/A;  . CHOLECYSTECTOMY N/A 09/14/2014   Procedure: LAPAROSCOPIC CHOLECYSTECTOMY;  Surgeon: Coralie Keens, MD;  Location: [facility];  Service: General;  Laterality: N/A;  . CYSTOSCOPY W/ URETERAL STENT PLACEMENT Left 01/30/2016   Procedure: CYSTOSCOPY WITH STENT REPLACEMENT;  Surgeon: Hollice Espy, MD;  Location: [facility];  Service: Urology;  Laterality: Left;  . CYSTOSCOPY WITH STENT PLACEMENT  01/24/2016   Procedure: CYSTOSCOPY WITH STENT PLACEMENT;  Surgeon: Hollice Espy, MD;  Location: [facility];   Service: Urology;;  . LITHOTRIPSY  2005  . URETEROSCOPY  01/24/2016   Procedure: URETEROSCOPY;  Surgeon: Hollice Espy, MD;  Location: [facility];  Service: Urology;;  . URETEROSCOPY Left 01/30/2016   Procedure: URETEROSCOPY;  Surgeon: Hollice Espy, MD;  Location: [facility];  Service: Urology;  Laterality: Left;  . WISDOM TOOTH EXTRACTION       OB History    Gravida  2   Para  1   Term  1   Preterm      AB  1   Living  1     SAB  1   TAB      Ectopic      Multiple  0   Live Births  1            Home Medications    Prior to Admission medications   Medication Sig Start Date End Date Taking? Authorizing Provider  buPROPion (WELLBUTRIN XL) 150 MG 24 hr tablet Take 1 tablet (150 mg total) by mouth daily. 03/01/19   Lawhorn, Lara Mulch, CNM  [employer] VITAMIN E 400 units capsule TAKE 1 CAPSULE (400 UNITS TOTAL) BY MOUTH DAILY. 03/28/19   Lawhorn, Lara Mulch, CNM  Evening Primrose Oil 500 MG CAPS Take 1 capsule (500 mg total) by mouth daily. 01/28/19   Diona Fanti, CNM  levonorgestrel (MIRENA) 20 MCG/24HR IUD 1 each by Intrauterine route once.    [provider]  lisdexamfetamine (VYVANSE) 30  MG capsule Take 1 capsule (30 mg total) by mouth daily. 02/17/19   Susy Frizzle, MD  montelukast (SINGULAIR) 10 MG tablet TAKE 1 TABLET BY MOUTH EVERY DAY 04/13/19   Susy Frizzle, MD  pantoprazole (PROTONIX) 40 MG tablet Take 1 tablet (40 mg total) by mouth daily. 02/08/18   Susy Frizzle, MD  topiramate (TOPAMAX) 25 MG tablet TAKE 2 TABLETS (50 MG TOTAL) BY MOUTH 2 (TWO) TIMES DAILY. 08/04/18   Susy Frizzle, MD    Family History Family History  Problem Relation Age of Onset  . Alzheimer's disease Paternal Uncle   . Thyroid cancer Paternal Uncle   . Alzheimer's disease Paternal Grandmother   . Kidney cancer Neg Hx   . Bladder Cancer Neg Hx   . Breast cancer Neg Hx   . Rashes / Skin problems Neg Hx   . Colon cancer Neg Hx   . Diabetes  Neg Hx   . Ovarian cancer Neg Hx     Social History Social History   Tobacco Use  . Smoking status: Never Smoker  . Smokeless tobacco: Never Used  Substance Use Topics  . Alcohol use: Yes    Comment: occasional   . Drug use: No     Allergies   Roxicet [oxycodone-acetaminophen]   Review of Systems Review of Systems  ROS reviewed and all otherwise negative except for mentioned in HPI   Physical Exam Updated Vital Signs BP 123/84   Pulse 92   Temp 98.3 F (36.8 C)   Resp 17   SpO2 98%  Vitals reviewed Physical Exam  Physical Examination: General appearance - alert, well appearing, and in no distress Mental status - alert, oriented to person, place, and time Eyes - no conjunctival injection, no scleral icterus Chest - clear to auscultation, no wheezes, rales or rhonchi, symmetric air entry Neurological - alert, oriented x 3 Extremities - peripheral pulses normal, no pedal edema, no clubbing or cyanosis Skin - normal coloration and turgor, no rashes   ED Treatments / Results  Labs (all labs ordered are listed, but only abnormal results are displayed) Labs Reviewed  NOVEL CORONAVIRUS, NAA (HOSP ORDER, SEND-OUT TO REF LAB; TAT 18-24 HRS)    EKG None  Radiology No results found.  Procedures Procedures (including critical care time)  Medications Ordered in ED Medications - No data to display   Initial Impression / Assessment and Plan / ED Course  I have reviewed the triage vital signs and the nursing notes.  Pertinent labs & imaging results that were available during my care of the patient were reviewed by me and considered in my medical decision making (see chart for details).       Pt presenting with c/o covid exposure.  She has had headache for the past 2 days.  No fever.  No difficulty breathing or low O2 sats.  Pt presenting for covid testing.  This was sent.  Pt discharged with strict return precautions.  Mom agreeable with plan  Kathryn Lozano was evaluated in Emergency Department on 04/27/2019 for the symptoms described in the history of present illness. She was evaluated in the context of the global COVID-19 pandemic, which necessitated consideration that the patient might be at risk for infection with the SARS-CoV-2 virus that causes COVID-19. Institutional protocols and algorithms that pertain to the evaluation of patients at risk for COVID-19 are in a state of rapid change based on information released by regulatory bodies including the CDC and  federal and state organizations. These policies and algorithms were followed during the patient's care in the ED.  Final Clinical Impressions(s) / ED Diagnoses   Final diagnoses:  Exposure to COVID-19 virus    ED Discharge Orders    None       Pixie Casino, MD 04/27/19 2215

## 2019-04-27 NOTE — ED Triage Notes (Addendum)
Pt arrives with exposure to + fam this weekend. Denies s/s. Requests test. Only c/o voiced- headache x 2 days and occasional cough

## 2019-04-29 LAB — NOVEL CORONAVIRUS, NAA (HOSP ORDER, SEND-OUT TO REF LAB; TAT 18-24 HRS): SARS-CoV-2, NAA: NOT DETECTED

## 2019-06-16 ENCOUNTER — Encounter: Payer: Self-pay | Admitting: Family Medicine

## 2019-06-16 MED ORDER — LISDEXAMFETAMINE DIMESYLATE 30 MG PO CAPS: 30.0000 mg | ORAL_CAPSULE | Freq: Every day | ORAL | 0 refills | Status: DC

## 2019-06-16 NOTE — Telephone Encounter (Signed)
Requesting refill    Vyvanse  LOV: 02/11/2019  LRF:  02/17/2019

## 2019-06-25 ENCOUNTER — Other Ambulatory Visit: Payer: Self-pay | Admitting: Certified Nurse Midwife

## 2019-07-06 ENCOUNTER — Encounter: Payer: Self-pay | Admitting: Family Medicine

## 2019-07-06 MED ORDER — EVENING PRIMROSE OIL 500 MG PO CAPS: 1.0000 | ORAL_CAPSULE | Freq: Every day | ORAL | 1 refills | Status: AC

## 2019-07-06 MED ORDER — PANTOPRAZOLE SODIUM 40 MG PO TBEC: 40.0000 mg | DELAYED_RELEASE_TABLET | Freq: Every day | ORAL | 1 refills | Status: DC

## 2019-07-06 MED ORDER — VITAMIN E 180 MG (400 UNIT) PO CAPS: ORAL_CAPSULE | ORAL | 1 refills | Status: AC

## 2019-07-06 MED ORDER — MONTELUKAST SODIUM 10 MG PO TABS: 10.0000 mg | ORAL_TABLET | Freq: Every day | ORAL | 1 refills | Status: DC

## 2019-07-25 ENCOUNTER — Other Ambulatory Visit: Payer: Self-pay | Admitting: Obstetrics and Gynecology

## 2019-07-25 NOTE — Telephone Encounter (Signed)
Patient needs a 6 month follow up with michelle for anxiety. Needs to be scheduled to see Sharyn Lull in February when she returns

## 2019-09-07 ENCOUNTER — Encounter: Payer: Self-pay | Admitting: Family Medicine

## 2019-09-07 NOTE — Telephone Encounter (Signed)
Ok to refill??  Last office visit 02/11/2019.  Last refill 06/16/2019.

## 2019-09-08 ENCOUNTER — Other Ambulatory Visit: Payer: Self-pay | Admitting: Family Medicine

## 2019-09-08 MED ORDER — LISDEXAMFETAMINE DIMESYLATE 30 MG PO CAPS: 30.0000 mg | ORAL_CAPSULE | Freq: Every day | ORAL | 0 refills | Status: DC

## 2019-09-08 NOTE — Telephone Encounter (Signed)
Please refuse as duplicate request.  

## 2019-09-19 ENCOUNTER — Ambulatory Visit: Payer: 59 | Attending: Internal Medicine

## 2019-09-19 ENCOUNTER — Other Ambulatory Visit: Payer: 59

## 2019-09-19 ENCOUNTER — Encounter: Payer: Self-pay | Admitting: Family Medicine

## 2019-09-19 DIAGNOSIS — Z20822 Contact with and (suspected) exposure to covid-19: Secondary | ICD-10-CM

## 2019-09-20 LAB — NOVEL CORONAVIRUS, NAA: SARS-CoV-2, NAA: NOT DETECTED

## 2019-10-16 ENCOUNTER — Other Ambulatory Visit: Payer: Self-pay | Admitting: Obstetrics and Gynecology

## 2019-11-18 ENCOUNTER — Other Ambulatory Visit: Payer: Self-pay | Admitting: Family Medicine

## 2019-11-18 ENCOUNTER — Other Ambulatory Visit: Payer: Self-pay

## 2019-11-18 ENCOUNTER — Ambulatory Visit
Admission: RE | Admit: 2019-11-18 | Discharge: 2019-11-18 | Disposition: A | Discharge status: Home / Self Care | Payer: 59 | Source: Ambulatory Visit | Attending: Family Medicine | Admitting: Family Medicine

## 2019-11-18 DIAGNOSIS — N644 Mastodynia: Secondary | ICD-10-CM

## 2019-11-18 DIAGNOSIS — N6489 Other specified disorders of breast: Secondary | ICD-10-CM

## 2019-11-25 ENCOUNTER — Encounter: Payer: Self-pay | Admitting: Family Medicine

## 2019-11-25 ENCOUNTER — Other Ambulatory Visit: Payer: Self-pay

## 2019-11-25 MED ORDER — BUPROPION HCL ER (XL) 150 MG PO TB24: 150.0000 mg | ORAL_TABLET | Freq: Every day | ORAL | 1 refills | Status: DC

## 2019-11-25 MED ORDER — LISDEXAMFETAMINE DIMESYLATE 30 MG PO CAPS: 30.0000 mg | ORAL_CAPSULE | Freq: Every day | ORAL | 0 refills | Status: DC

## 2019-11-25 NOTE — Telephone Encounter (Signed)
Requested Prescriptions   Pending Prescriptions Disp Refills  . lisdexamfetamine (VYVANSE) 30 MG capsule 30 capsule 0    Sig: Take 1 capsule (30 mg total) by mouth daily.  Marland Kitchen buPROPion (WELLBUTRIN XL) 150 MG 24 hr tablet 30 tablet 1    Sig: Take 1 tablet (150 mg total) by mouth daily.     Last OV 11/25/2019    Last written 06/27/2019 wellbutrin                     09/08/2019 vyvanse

## 2019-11-30 ENCOUNTER — Other Ambulatory Visit: Payer: Self-pay

## 2019-11-30 MED ORDER — TOPIRAMATE 25 MG PO TABS: 50.0000 mg | ORAL_TABLET | Freq: Two times a day (BID) | ORAL | 2 refills | Status: DC

## 2020-01-23 ENCOUNTER — Other Ambulatory Visit: Payer: 59

## 2020-01-26 ENCOUNTER — Encounter: Payer: Self-pay | Admitting: Family Medicine

## 2020-01-27 MED ORDER — MONTELUKAST SODIUM 10 MG PO TABS: 10.0000 mg | ORAL_TABLET | Freq: Every day | ORAL | 0 refills | Status: DC

## 2020-01-27 MED ORDER — PANTOPRAZOLE SODIUM 40 MG PO TBEC: 40.0000 mg | DELAYED_RELEASE_TABLET | Freq: Every day | ORAL | 0 refills | Status: DC

## 2020-01-27 MED ORDER — LISDEXAMFETAMINE DIMESYLATE 30 MG PO CAPS: 30.0000 mg | ORAL_CAPSULE | Freq: Every day | ORAL | 0 refills | Status: DC

## 2020-01-27 NOTE — Telephone Encounter (Signed)
Ok to refill??  Last office visit 02/24/2019.  Last refill 11/25/2019.  Of note, patient made aware to schedule OV.

## 2020-02-28 IMAGING — US ULTRASOUND LEFT BREAST LIMITED
1 series · 6 of 6 positions shown · non-contrast
Comparison: None.

CLINICAL DATA: Palpable lump and pain, outer posterior LEFT breast.
This is patient's first mammogram.

EXAM:
DIGITAL DIAGNOSTIC BILATERAL MAMMOGRAM WITH CAD AND TOMO
ULTRASOUND LEFT BREAST

[Series 1: ultrasound left breast limited · 0.07mm/px · 6 of 6 slices shown]
[im 1/6]
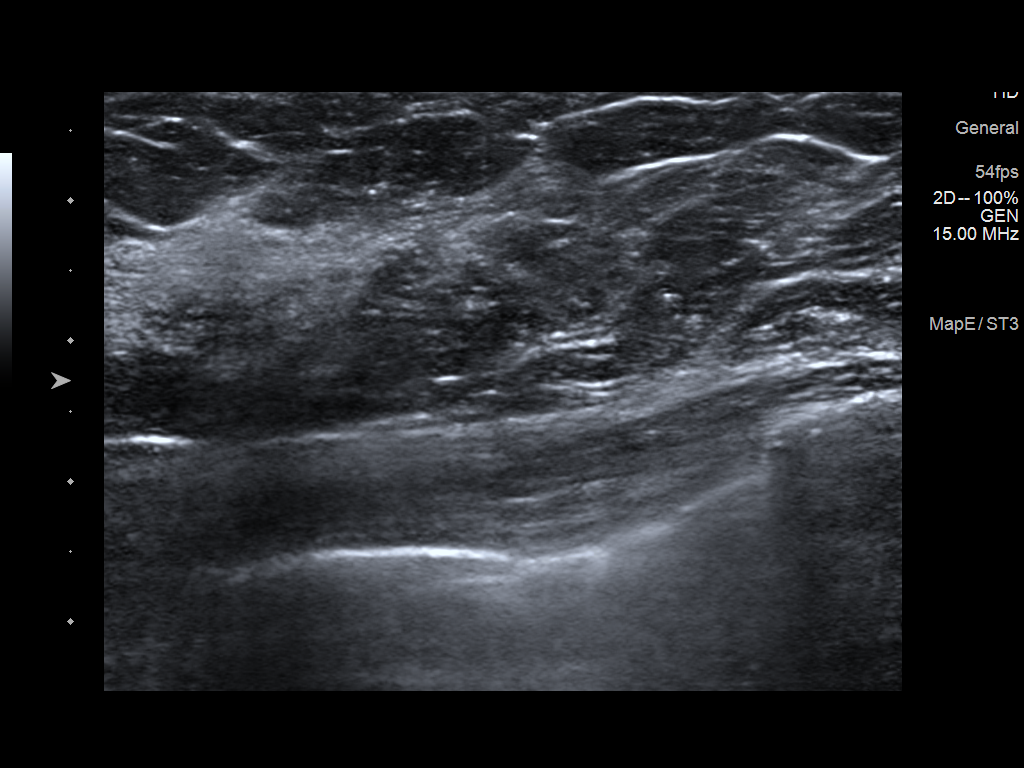
[im 2/6]
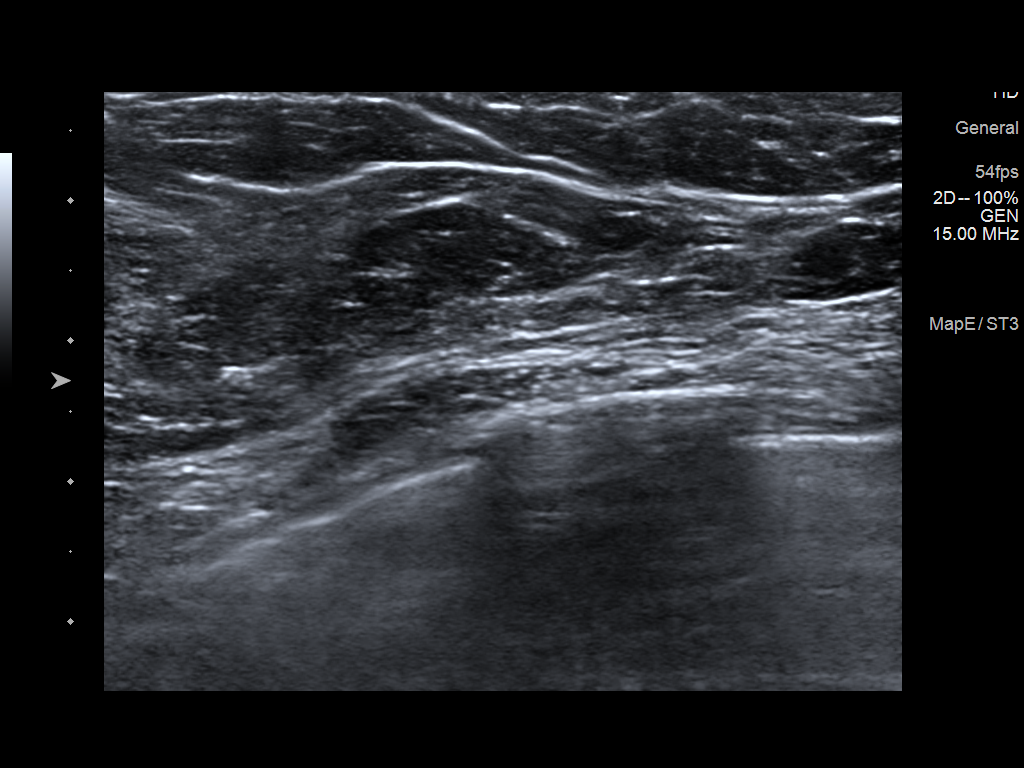
[im 3/6]
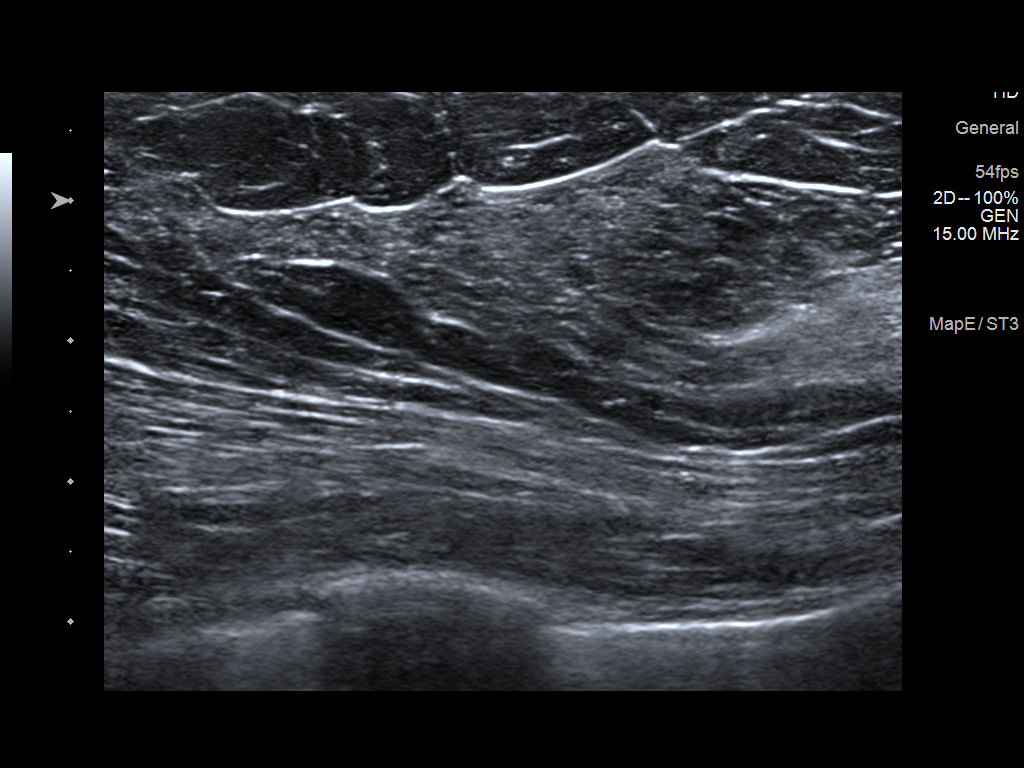
[im 4/6]
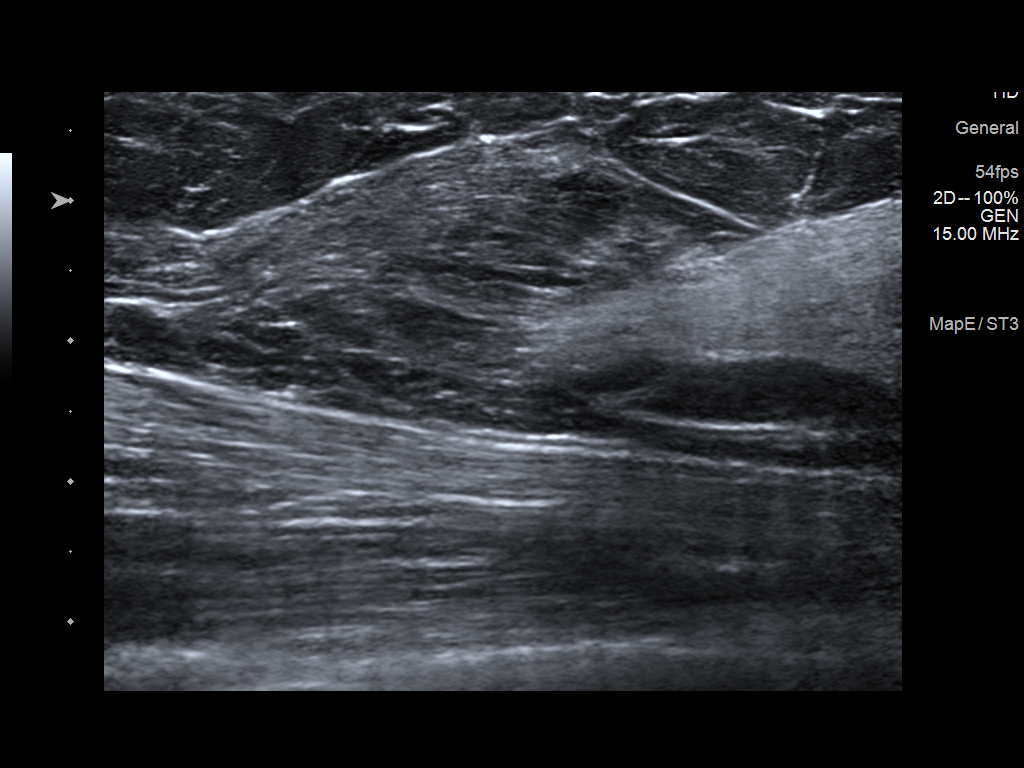
[im 5/6]
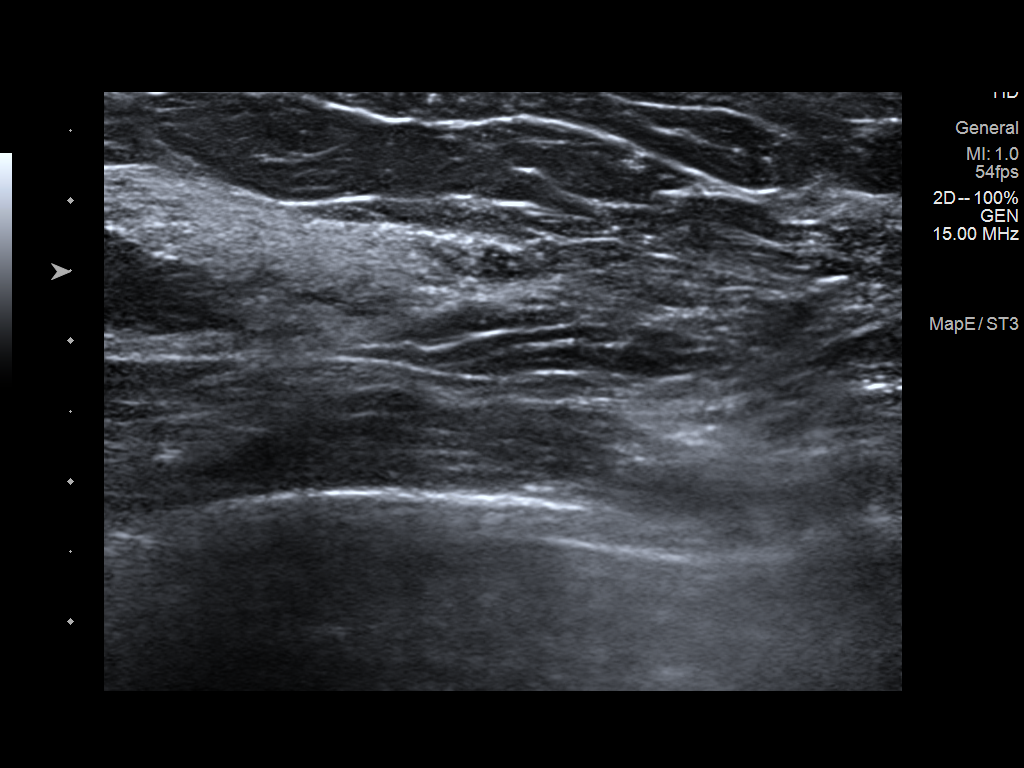
[im 6/6]
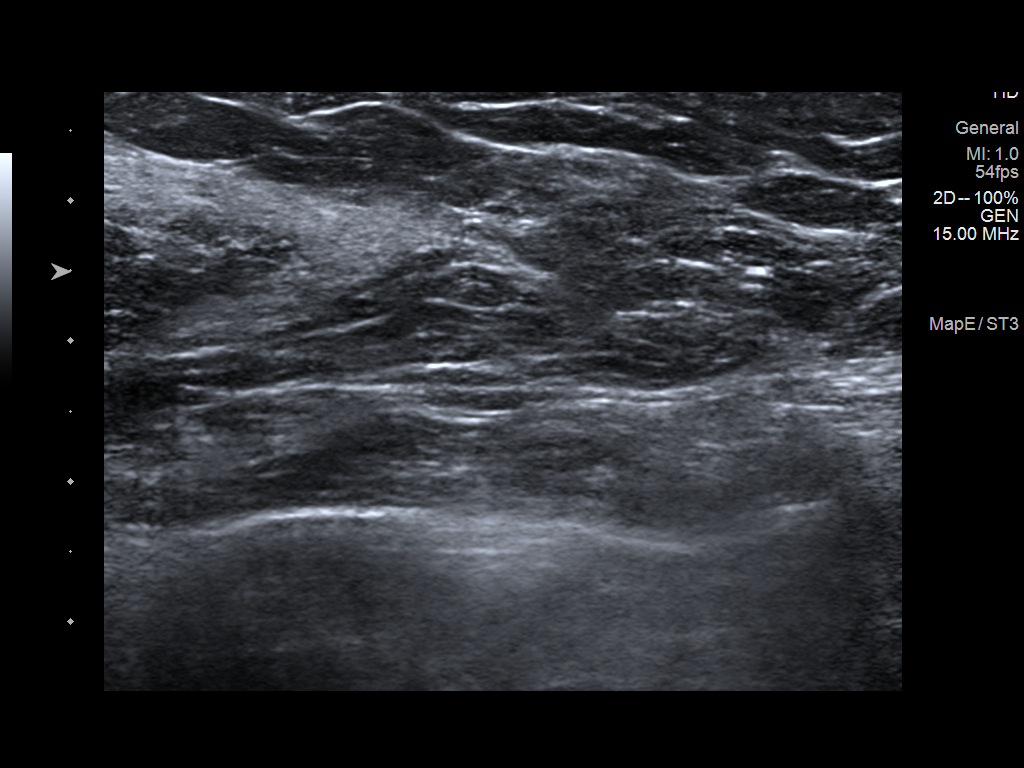

[6 of 6 positions shown; findings below may reference images not displayed]

ACR Breast Density Category c: The breast tissue is heterogeneously
dense, which may obscure small masses.
FINDINGS: There is a subtle asymmetry within the upper LEFT breast, at middle
depth, MLO and true lateral views only, slices 39 and 19
respectively, persistent on spot compression MLO slice 29, upper
outer quadrant based on tomosynthesis slice position. This will be
further evaluated with ultrasound.

Mammographic images were processed with CAD.

On physical exam, patient directs me to a site of focal pain within
the far outer LEFT breast. There is no palpable mass in this area.

Targeted ultrasound is performed, evaluating the far outer LEFT
breast as directed by the patient, 3:30 o'clock axis, showing only
normal fibroglandular tissues and fat lobules. No solid or cystic
mass.

Next, targeted ultrasound is performed, evaluating the upper LEFT
breast corresponding to the area of subtle asymmetry seen on
mammogram, showing only normal fibroglandular tissues and fat
lobules throughout. No solid or cystic mass.
IMPRESSION: 1. Subtle asymmetry within the upper LEFT breast, seen on MLO and
true lateral views only, upper outer quadrant based on tomosynthesis
slice position, without sonographic correlate, most suggestive of
superimposition of normal fibroglandular tissues. Recommend
follow-up LEFT breast diagnostic mammogram in 6 months to ensure
stability.
2. No evidence of malignancy within the far outer LEFT breast,
corresponding to site of patient's pain.
3. No evidence of malignancy within the RIGHT breast.

RECOMMENDATION:
1. LEFT breast diagnostic mammogram in 6 months.
2. Benign causes of breast pain, and possible remedies, were
discussed with the patient. Additionally, multiple ribs underlie the
area of pain within the far outer LEFT breast raising the
possibility of costochondritis. Patient was encouraged to follow-up
with referring physician if pain worsened or if a palpable lump/mass
developed.

I have discussed the findings and recommendations with the patient.
Results were also provided in writing at the conclusion of the
visit. If applicable, a reminder letter will be sent to the patient
regarding the next appointment.

BI-RADS CATEGORY  3: Probably benign.

## 2020-03-05 ENCOUNTER — Other Ambulatory Visit: Payer: Self-pay | Admitting: Family Medicine

## 2020-03-08 ENCOUNTER — Other Ambulatory Visit: Payer: Self-pay

## 2020-03-15 ENCOUNTER — Other Ambulatory Visit: Payer: Self-pay | Admitting: *Deleted

## 2020-03-15 MED ORDER — LISDEXAMFETAMINE DIMESYLATE 30 MG PO CAPS: 30.0000 mg | ORAL_CAPSULE | Freq: Every day | ORAL | 0 refills | Status: DC

## 2020-03-15 NOTE — Telephone Encounter (Signed)
Received call from patient.   Requested refill on 01/27/2020.  Ok to refill??  Last office visit 01/26/2020.  Last refill 01/27/2020.

## 2020-03-20 ENCOUNTER — Other Ambulatory Visit: Payer: Self-pay

## 2020-03-20 ENCOUNTER — Ambulatory Visit (INDEPENDENT_AMBULATORY_CARE_PROVIDER_SITE_OTHER): Payer: Self-pay | Admitting: Family Medicine

## 2020-03-20 VITALS — BP 112/70 | HR 88 | Resp 16 | Ht 63.0 in | Wt 208.0 lb

## 2020-03-20 DIAGNOSIS — F900 Attention-deficit hyperactivity disorder, predominantly inattentive type: Secondary | ICD-10-CM

## 2020-03-20 DIAGNOSIS — Z23 Encounter for immunization: Secondary | ICD-10-CM

## 2020-03-20 MED ORDER — BUPROPION HCL ER (XL) 150 MG PO TB24: 150.0000 mg | ORAL_TABLET | Freq: Every day | ORAL | 1 refills | Status: DC

## 2020-03-20 NOTE — Progress Notes (Signed)
Subjective:    Patient ID: Noel Gerold, female    DOB: [birth year], 34 y.o.   MRN: [id number]  HPI Patient is a very pleasant 34 year old Caucasian female who presents today for refill on her ADD medicine.  She is dealt with attention deficit hyperactivity disorder her entire life.  In the past we have tried the patient on Adderall XR.  This caused the patient to experience tachycardia and also seemed to have a very short half-life and not provide sustained response and helping her maintain her focus.  We also tried Concerta in the past.  Concerta was also not effective.  The patient states that the medication did not work well at helping her maintain focus.  Therefore, the patient ultimately started Vyvanse 30 mg a day.  The Vyvanse seems to work well.  She states that without the medication she finds herself starting 5 or 6 tasks simultaneously but completing none of them.  She states that she is easily distracted.  She states that she cannot maintain focus at work and finds herself failing to complete assignments in a reasonable fashion.  Therefore she wants to continue the current medication.  Unfortunately her insurance requires a prior authorization.  However as I have stated earlier, in the past the patient has tried and failed Adderall as well as Concerta. Past Medical History:  Diagnosis Date  . ADHD (attention deficit hyperactivity disorder)   . Anxiety   . Heartburn   . Migraines   . PONV (postoperative nausea and vomiting)    Pt reports nausea, "sensitive stomach"  . Renal disorder    KIDNEY STONES   Past Surgical History:  Procedure Laterality Date  . CESAREAN SECTION N/A 06/23/2017   Procedure: CESAREAN SECTION;  Surgeon: Brayton Mars, MD;  Location: [facility];  Service: Obstetrics;  Laterality: N/A;  . CHOLECYSTECTOMY N/A 09/14/2014   Procedure: LAPAROSCOPIC CHOLECYSTECTOMY;  Surgeon: Coralie Keens, MD;  Location: [facility];  Service: General;  Laterality: N/A;  .  CYSTOSCOPY W/ URETERAL STENT PLACEMENT Left 01/30/2016   Procedure: CYSTOSCOPY WITH STENT REPLACEMENT;  Surgeon: Hollice Espy, MD;  Location: [facility];  Service: Urology;  Laterality: Left;  . CYSTOSCOPY WITH STENT PLACEMENT  01/24/2016   Procedure: CYSTOSCOPY WITH STENT PLACEMENT;  Surgeon: Hollice Espy, MD;  Location: [facility];  Service: Urology;;  . LITHOTRIPSY  2005  . URETEROSCOPY  01/24/2016   Procedure: URETEROSCOPY;  Surgeon: Hollice Espy, MD;  Location: [facility];  Service: Urology;;  . URETEROSCOPY Left 01/30/2016   Procedure: URETEROSCOPY;  Surgeon: Hollice Espy, MD;  Location: [facility];  Service: Urology;  Laterality: Left;  . WISDOM TOOTH EXTRACTION     Current Outpatient Medications on File Prior to Visit  Medication Sig Dispense Refill  . Evening Primrose Oil 500 MG CAPS Take 1 capsule (500 mg total) by mouth daily. 90 capsule 1  . levonorgestrel (MIRENA) 20 MCG/24HR IUD 1 each by Intrauterine route once.    . lisdexamfetamine (VYVANSE) 30 MG capsule Take 1 capsule (30 mg total) by mouth daily. 30 capsule 0  . montelukast (SINGULAIR) 10 MG tablet TAKE 1 TABLET BY MOUTH EVERY DAY 30 tablet 0  . pantoprazole (PROTONIX) 40 MG tablet Take 1 tablet (40 mg total) by mouth daily. 30 tablet 0  . topiramate (TOPAMAX) 25 MG tablet Take 2 tablets (50 mg total) by mouth 2 (two) times daily. 360 tablet 2  . vitamin E ([employer]S VITAMIN E) 400 UNIT capsule TAKE 1 CAPSULE (400 UNITS TOTAL) BY  MOUTH DAILY. 90 capsule 1   No current facility-administered medications on file prior to visit.   Allergies  Allergen Reactions  . Oxycodone-Acetaminophen Itching    Pt states that she is itching all over her body.  Pt states that she is itching all over her body.  Pt states that she is itching all over her body.    Social History   Socioeconomic History  . Marital status: Legally Separated    Spouse name: Not on file  . Number of children: Not on file  . Years of education: Not on file  .  Highest education level: Not on file  Occupational History  . Not on file  Tobacco Use  . Smoking status: Never Smoker  . Smokeless tobacco: Never Used  Vaping Use  . Vaping Use: Never used  Substance and Sexual Activity  . Alcohol use: Yes    Comment: occasional   . Drug use: No  . Sexual activity: Not Currently    Birth control/protection: I.U.D., Condom  Other Topics Concern  . Not on file  Social History Narrative  . Not on file   Social Determinants of Health   Financial Resource Strain:   . Difficulty of Paying Living Expenses: Not on file  Food Insecurity:   . Worried About Charity fundraiser in the Last Year: Not on file  . Ran Out of Food in the Last Year: Not on file  Transportation Needs:   . Lack of Transportation (Medical): Not on file  . Lack of Transportation (Non-Medical): Not on file  Physical Activity:   . Days of Exercise per Week: Not on file  . Minutes of Exercise per Session: Not on file  Stress:   . Feeling of Stress : Not on file  Social Connections:   . Frequency of Communication with Friends and Family: Not on file  . Frequency of Social Gatherings with Friends and Family: Not on file  . Attends Religious Services: Not on file  . Active Member of Clubs or Organizations: Not on file  . Attends Archivist Meetings: Not on file  . Marital Status: Not on file  Intimate Partner Violence:   . Fear of Current or Ex-Partner: Not on file  . Emotionally Abused: Not on file  . Physically Abused: Not on file  . Sexually Abused: Not on file   Family History  Problem Relation Age of Onset  . Alzheimer's disease Paternal Uncle   . Thyroid cancer Paternal Uncle   . Alzheimer's disease Paternal Grandmother   . Kidney cancer Neg Hx   . Bladder Cancer Neg Hx   . Breast cancer Neg Hx   . Rashes / Skin problems Neg Hx   . Colon cancer Neg Hx   . Diabetes Neg Hx   . Ovarian cancer Neg Hx       Review of Systems  All other systems reviewed  and are negative.      Objective:   Physical Exam Vitals reviewed.  Constitutional:      General: She is not in acute distress.    Appearance: She is well-developed. She is not diaphoretic.  HENT:     Head: Normocephalic and atraumatic.     Right Ear: External ear normal.     Left Ear: External ear normal.     Nose: Nose normal.     Mouth/Throat:     Pharynx: No oropharyngeal exudate.  Eyes:     General: No scleral icterus.  Right eye: No discharge.        Left eye: No discharge.     Conjunctiva/sclera: Conjunctivae normal.     Pupils: Pupils are equal, round, and reactive to light.  Neck:     Thyroid: No thyromegaly.     Vascular: No JVD.     Trachea: No tracheal deviation.  Cardiovascular:     Rate and Rhythm: Normal rate and regular rhythm.     Heart sounds: Normal heart sounds. No murmur heard.  No friction rub. No gallop.   Pulmonary:     Effort: Pulmonary effort is normal. No respiratory distress.     Breath sounds: Normal breath sounds. No stridor. No wheezing or rales.  Chest:     Chest wall: No tenderness.  Abdominal:     General: Bowel sounds are normal. There is no distension.     Palpations: Abdomen is soft. There is no mass.     Tenderness: There is no abdominal tenderness. There is no guarding or rebound.  Musculoskeletal:     Cervical back: Normal range of motion and neck supple.  Lymphadenopathy:     Cervical: No cervical adenopathy.  Skin:    General: Skin is warm.     Coloration: Skin is not pale.     Findings: No erythema or rash.  Neurological:     Mental Status: She is alert and oriented to person, place, and time.     Cranial Nerves: No cranial nerve deficit.     Motor: No abnormal muscle tone.     Coordination: Coordination normal.     Deep Tendon Reflexes: Reflexes are normal and symmetric.  Psychiatric:        Behavior: Behavior normal.        Thought Content: Thought content normal.        Judgment: Judgment normal.      Assessment & Plan:  Need for immunization against influenza - Plan: Flu Vaccine QUAD 36+ mos IM  Attention deficit hyperactivity disorder (ADHD), predominantly inattentive type  Continue Vyvanse 30 mg a day.  I will try to get the prior authorization approved for the patient so that she can continue this medication.  Patient has tried and failed Adderall and Concerta in the past.

## 2020-03-22 ENCOUNTER — Telehealth: Payer: Self-pay | Admitting: *Deleted

## 2020-03-22 NOTE — Telephone Encounter (Signed)
Prior Authorization submitted.   Your PA has been faxed to the plan as a paper copy. Please contact the plan directly if you haven't received a determination in a typical timeframe.  You will be notified of the determination via fax.

## 2020-03-22 NOTE — Telephone Encounter (Signed)
-----   Message from [place], LPN sent at 0/22/3361  9:07 AM EDT -----  ----- Message ----- From: Susy Frizzle, MD Sent: 03/20/2020   5:17 PM EDT To: Saundra Shelling, [place]  Patient needs prior authorization completed for vyvanse (see ov 9/14).

## 2020-03-29 NOTE — Telephone Encounter (Signed)
Received PA determination.   PA approved.   Pharmacy made aware.

## 2020-04-17 ENCOUNTER — Encounter: Payer: Self-pay | Admitting: Family Medicine

## 2020-04-17 MED ORDER — PANTOPRAZOLE SODIUM 40 MG PO TBEC: 40.0000 mg | DELAYED_RELEASE_TABLET | Freq: Every day | ORAL | 3 refills | Status: DC

## 2020-04-17 MED ORDER — TOPIRAMATE 25 MG PO TABS: 50.0000 mg | ORAL_TABLET | Freq: Two times a day (BID) | ORAL | 2 refills | Status: DC

## 2020-04-18 MED ORDER — MONTELUKAST SODIUM 10 MG PO TABS: 10.0000 mg | ORAL_TABLET | Freq: Every day | ORAL | 3 refills | Status: DC

## 2020-05-17 ENCOUNTER — Encounter: Payer: Self-pay | Admitting: Family Medicine

## 2020-05-17 ENCOUNTER — Other Ambulatory Visit: Payer: Self-pay | Admitting: Family Medicine

## 2020-05-17 MED ORDER — LISDEXAMFETAMINE DIMESYLATE 30 MG PO CAPS: 30.0000 mg | ORAL_CAPSULE | Freq: Every day | ORAL | 0 refills | Status: DC

## 2020-06-21 ENCOUNTER — Telehealth: Payer: Self-pay | Admitting: Family Medicine

## 2020-06-21 NOTE — Telephone Encounter (Signed)
Need immunizations records  fax # [phone] Atten: [facility]

## 2020-06-25 NOTE — Telephone Encounter (Signed)
Patient Immunization Record faxed to [facility]

## 2020-07-13 ENCOUNTER — Other Ambulatory Visit: Payer: Self-pay

## 2020-07-13 ENCOUNTER — Ambulatory Visit: Payer: Medicaid Other | Admitting: Family Medicine

## 2020-07-13 DIAGNOSIS — J069 Acute upper respiratory infection, unspecified: Secondary | ICD-10-CM

## 2020-07-13 LAB — INFLUENZA A AND B AG, IMMUNOASSAY
INFLUENZA A ANTIGEN: NOT DETECTED
INFLUENZA B ANTIGEN: NOT DETECTED

## 2020-07-13 NOTE — Progress Notes (Signed)
Subjective:    Patient ID: Kathryn Lozano, female    DOB: [birth year], 35 y.o.   MRN: [id number]  HPI Patient is being seen today in her vehicle in a parking lot.  She states that her best friend tested positive for Covid 48 hours ago.  Over the last 2 days she has had similar symptoms.  She reports fatigue, body aches, cough, runny nose, head congestion.  Her temperature today right before coming to the office was 97.4.  She has not had a fever.  She denies any chest pain or shortness of breath.  She has had 2 doses of her Covid vaccine.  She denies any otalgia or sinus pain or sore throat. Past Medical History:  Diagnosis Date  . ADHD (attention deficit hyperactivity disorder)   . Anxiety   . Heartburn   . Migraines   . PONV (postoperative nausea and vomiting)    Pt reports nausea, "sensitive stomach"  . Renal disorder    KIDNEY STONES   Past Surgical History:  Procedure Laterality Date  . CESAREAN SECTION N/A 06/23/2017   Procedure: CESAREAN SECTION;  Surgeon: Brayton Mars, MD;  Location: [facility];  Service: Obstetrics;  Laterality: N/A;  . CHOLECYSTECTOMY N/A 09/14/2014   Procedure: LAPAROSCOPIC CHOLECYSTECTOMY;  Surgeon: Coralie Keens, MD;  Location: [facility];  Service: General;  Laterality: N/A;  . CYSTOSCOPY W/ URETERAL STENT PLACEMENT Left 01/30/2016   Procedure: CYSTOSCOPY WITH STENT REPLACEMENT;  Surgeon: Hollice Espy, MD;  Location: [facility];  Service: Urology;  Laterality: Left;  . CYSTOSCOPY WITH STENT PLACEMENT  01/24/2016   Procedure: CYSTOSCOPY WITH STENT PLACEMENT;  Surgeon: Hollice Espy, MD;  Location: [facility];  Service: Urology;;  . LITHOTRIPSY  2005  . URETEROSCOPY  01/24/2016   Procedure: URETEROSCOPY;  Surgeon: Hollice Espy, MD;  Location: [facility];  Service: Urology;;  . URETEROSCOPY Left 01/30/2016   Procedure: URETEROSCOPY;  Surgeon: Hollice Espy, MD;  Location: [facility];  Service: Urology;  Laterality: Left;  . WISDOM TOOTH EXTRACTION      Current Outpatient Medications on File Prior to Visit  Medication Sig Dispense Refill  . buPROPion (WELLBUTRIN XL) 150 MG 24 hr tablet Take 1 tablet (150 mg total) by mouth daily. 30 tablet 1  . Evening Primrose Oil 500 MG CAPS Take 1 capsule (500 mg total) by mouth daily. 90 capsule 1  . levonorgestrel (MIRENA) 20 MCG/24HR IUD 1 each by Intrauterine route once.    . lisdexamfetamine (VYVANSE) 30 MG capsule Take 1 capsule (30 mg total) by mouth daily. 30 capsule 0  . montelukast (SINGULAIR) 10 MG tablet Take 1 tablet (10 mg total) by mouth daily. 30 tablet 3  . pantoprazole (PROTONIX) 40 MG tablet Take 1 tablet (40 mg total) by mouth daily. 90 tablet 3  . topiramate (TOPAMAX) 25 MG tablet Take 2 tablets (50 mg total) by mouth 2 (two) times daily. 360 tablet 2  . vitamin E ([employer] VITAMIN E) 400 UNIT capsule TAKE 1 CAPSULE (400 UNITS TOTAL) BY MOUTH DAILY. 90 capsule 1   No current facility-administered medications on file prior to visit.   Allergies  Allergen Reactions  . Oxycodone-Acetaminophen Itching    Pt states that she is itching all over her body.  Pt states that she is itching all over her body.  Pt states that she is itching all over her body.    Social History   Socioeconomic History  . Marital status: Legally Separated    Spouse name: Not on  file  . Number of children: Not on file  . Years of education: Not on file  . Highest education level: Not on file  Occupational History  . Not on file  Tobacco Use  . Smoking status: Never Smoker  . Smokeless tobacco: Never Used  Vaping Use  . Vaping Use: Never used  Substance and Sexual Activity  . Alcohol use: Yes    Comment: occasional   . Drug use: No  . Sexual activity: Not Currently    Birth control/protection: I.U.D., Condom  Other Topics Concern  . Not on file  Social History Narrative  . Not on file   Social Determinants of Health   Financial Resource Strain: Not on file  Food Insecurity: Not on file   Transportation Needs: Not on file  Physical Activity: Not on file  Stress: Not on file  Social Connections: Not on file  Intimate Partner Violence: Not on file      Review of Systems  All other systems reviewed and are negative.      Objective:   Physical Exam Constitutional:      General: She is not in acute distress.    Appearance: Normal appearance. She is not ill-appearing, toxic-appearing or diaphoretic.  HENT:     Nose: Congestion and rhinorrhea present.  Cardiovascular:     Rate and Rhythm: Normal rate and regular rhythm.     Heart sounds: Normal heart sounds. No murmur heard. No friction rub. No gallop.   Pulmonary:     Effort: Pulmonary effort is normal. No respiratory distress.     Breath sounds: Normal breath sounds. No stridor. No wheezing, rhonchi or rales.  Neurological:     Mental Status: She is alert.         Assessment & Plan:  The encounter diagnosis was Viral upper respiratory tract infection.  Patient went to an urgent care yesterday where they performed a Covid PCR test.  She has not had the results return for this however this test is still pending.  She would like to be also tested for the flu.  Her symptoms are mild.  She denies any shortness of breath or chest pain or nausea or vomiting or pleurisy.  All that would be required for either infection is supportive care.  Therefore I did perform a flu test today.  Patient will await the results of the Covid test that was performed yesterday also and quarantine until the test have returned.  Flu test is negative.  Suspect covid.  Quarantine and await results.

## 2020-07-25 ENCOUNTER — Encounter: Payer: Self-pay | Admitting: Family Medicine

## 2020-07-25 MED ORDER — MONTELUKAST SODIUM 10 MG PO TABS: 10.0000 mg | ORAL_TABLET | Freq: Every day | ORAL | 1 refills | Status: AC

## 2020-07-25 MED ORDER — BUPROPION HCL ER (XL) 150 MG PO TB24: 150.0000 mg | ORAL_TABLET | Freq: Every day | ORAL | 1 refills | Status: AC

## 2020-08-03 ENCOUNTER — Encounter: Payer: Self-pay | Admitting: Family Medicine

## 2020-08-03 MED ORDER — LISDEXAMFETAMINE DIMESYLATE 30 MG PO CAPS: 30.0000 mg | ORAL_CAPSULE | Freq: Every day | ORAL | 0 refills | Status: DC

## 2020-08-03 NOTE — Telephone Encounter (Signed)
Ok to refill??  Last office visit 07/13/2020.  Last refill 05/17/2020.

## 2020-11-23 ENCOUNTER — Other Ambulatory Visit: Payer: Self-pay | Admitting: *Deleted

## 2020-11-23 MED ORDER — PANTOPRAZOLE SODIUM 40 MG PO TBEC: 40.0000 mg | DELAYED_RELEASE_TABLET | Freq: Every day | ORAL | 3 refills | Status: AC

## 2020-11-28 ENCOUNTER — Encounter: Payer: Self-pay | Admitting: Family Medicine

## 2020-11-29 MED ORDER — LISDEXAMFETAMINE DIMESYLATE 30 MG PO CAPS: 30.0000 mg | ORAL_CAPSULE | Freq: Every day | ORAL | 0 refills | Status: AC

## 2020-11-29 NOTE — Telephone Encounter (Signed)
Ok to refill??  Last office visit 07/13/2020.  Last refill 08/03/2020.

## 2021-03-06 ENCOUNTER — Other Ambulatory Visit: Payer: Self-pay | Admitting: Family Medicine

## 2022-07-21 ENCOUNTER — Telehealth: Payer: Self-pay

## 2022-07-21 NOTE — Telephone Encounter (Signed)
Transition Care Management Unsuccessful Follow-up Telephone Call  Date of discharge and from where:  [facility] 1.14.24  Attempts:  1st Attempt  Reason for unsuccessful TCM follow-up call:  Left voice message

## 2022-07-22 NOTE — Telephone Encounter (Signed)
Transition Care Management Unsuccessful Follow-up Telephone Call  Date of discharge and from where:  [facility] 1.14.24  Attempts:  2nd Attempt  Reason for unsuccessful TCM follow-up call:  Left voice message

## 2022-07-25 NOTE — Telephone Encounter (Signed)
Transition Care Management Unsuccessful Follow-up Telephone Call  Date of discharge and from where:  [facility] 07/20/22  Attempts:  3rd Attempt  Reason for unsuccessful TCM follow-up call:  Left voice message    

## 2024-07-14 ENCOUNTER — Emergency Department

## 2024-07-14 ENCOUNTER — Emergency Department
Admission: EM | Admit: 2024-07-14 | Discharge: 2024-07-14 | Disposition: A | Discharge status: Home / Self Care | Attending: Emergency Medicine | Admitting: Emergency Medicine

## 2024-07-14 ENCOUNTER — Other Ambulatory Visit: Payer: Self-pay

## 2024-07-14 DIAGNOSIS — R202 Paresthesia of skin: Secondary | ICD-10-CM | POA: Insufficient documentation

## 2024-07-14 DIAGNOSIS — I10 Essential (primary) hypertension: Secondary | ICD-10-CM | POA: Insufficient documentation

## 2024-07-14 DIAGNOSIS — R071 Chest pain on breathing: Secondary | ICD-10-CM | POA: Insufficient documentation

## 2024-07-14 DIAGNOSIS — R0602 Shortness of breath: Secondary | ICD-10-CM | POA: Diagnosis not present

## 2024-07-14 LAB — CBC
HCT: 39.3 % (ref 36.0–46.0)
Hemoglobin: 12.6 g/dL (ref 12.0–15.0)
MCH: 25.7 pg — ABNORMAL LOW (ref 26.0–34.0)
MCHC: 32.1 g/dL (ref 30.0–36.0)
MCV: 80.2 fL (ref 80.0–100.0)
Platelets: 257 K/uL (ref 150–400)
RBC: 4.9 MIL/uL (ref 3.87–5.11)
RDW: 14.6 % (ref 11.5–15.5)
WBC: 8.4 K/uL (ref 4.0–10.5)
nRBC: 0 % (ref 0.0–0.2)

## 2024-07-14 LAB — POC URINE PREG, ED: Preg Test, Ur: NEGATIVE

## 2024-07-14 MED ORDER — IOHEXOL 350 MG/ML SOLN
100.0000 mL | Freq: Once | INTRAVENOUS | Status: AC | PRN
  Administered 2024-07-14: 100 mL via INTRAVENOUS

## 2024-07-14 NOTE — Progress Notes (Signed)
 " Subjective  Patient ID: Kathryn Lozano is a 39 y.o. female.  Chief Complaint  Patient presents with   Chest Pain    Pt complains of chest pain in the middle of her chest. Pain felt like pressure. Pain began 6-7 pm, the pain worsened at night with palpitations. Pt also reports ha and nausea.     The following information was reviewed by members of the visit team:  Tobacco  Allergies  Meds  Problems  Med Hx  Surg Hx  OB Status   Fam Hx      Chest Pain  This is a new problem. The current episode started in the past 7 days. The onset quality is sudden. Episode frequency: Became more persistent and consistent yesterday. The pain is present in the substernal region. The quality of the pain is described as tightness. Radiates to: Radiates from center of the chest near the epigastrium to the back. Associated symptoms include back pain, irregular heartbeat, numbness and palpitations. Pertinent negatives include no abdominal pain, claudication, cough, diaphoresis, dizziness, fever, headaches, hemoptysis, leg pain, lower extremity edema, malaise/fatigue, nausea, near-syncope, orthopnea, PND, shortness of breath, sputum production, syncope, vomiting or weakness. Associated symptoms comments: Chest pain did interfere with sleep last p.m. States that she does have some left arm numbness at times.  Can have chest pain without the arm numbness Periodically has left hand numbness but has felt that has been due to position, driving or car potential carpal tunnel. Associated with: No cough. 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  Not on OCPs has Mirena  IUD).  Her past medical history is significant for stimulant use and valve disorder.  Pertinent negatives for past medical history include no aneurysm, no anxiety/panic attacks, no aortic aneurysm, no aortic dissection, no arrhythmia, no bicuspid aortic valve, no CAD, no cancer, no congenital heart  disease, no connective tissue disease, no COPD, no CHF, no diabetes, no DVT, no hyperhomocysteinemia, no hyperlipidemia, no hypertension, no Kawasaki disease, no Marfan's syndrome, no MI, no mitral valve prolapse, no pacemaker, no PE, no PVD, no recent injury, no rheumatic fever, no seizures, no sickle cell disease, no sleep apnea, no spontaneous pneumothorax, no strokes, no thyroid  problem, no TIA and Turner syndrome. Past medical history comments: Patient is on Vyvanse  Family history comments: No known family history of cardiac or pulmonary abnormalities however maternal old medical history and maternal family medical history is unknown Prior diagnostic workup includes chest x-ray (Chest x-ray, EKG and lab work EKG unchanged from prior chest x-ray normal labs pending).   Patient indicates that her Wellbutrin  which she has been on for period of time was increased and due to some side effects the dosing was decreased again and no side effects resolved  Patient is currently on Vyvanse  which is not a new medication patient does have allergies-has been prescribed Singulair   Patient does have a history of reflux and is on Nexium 40 mg daily and is compliant with his medications patient also has mild deficiency and vitamin D currently on vitamin D supplementation  Patient has mild anemia and is compliant with iron supplementation   Review of Systems  Constitutional:  Negative for activity change, appetite change, chills, diaphoresis, fatigue, fever and malaise/fatigue.  Eyes:  Negative for visual disturbance.  Respiratory:  Positive for chest tightness. Negative for cough, hemoptysis, sputum production, shortness of breath and wheezing.        No history of pulmonary abnormalities such  as asthma, emphysema, COPD or pulmonary fibrosis  Cardiovascular:  Positive for chest pain and palpitations. Negative for orthopnea, claudication, leg swelling, syncope, PND and near-syncope.       Known history of PVCs   Gastrointestinal:  Negative for abdominal distention, abdominal pain, diarrhea, nausea and vomiting.  Musculoskeletal:  Positive for back pain. Negative for myalgias, neck pain and neck stiffness.  Allergic/Immunologic: Negative for environmental allergies, food allergies and immunocompromised state.  Neurological:  Positive for numbness. Negative for dizziness, tremors, seizures, facial asymmetry, weakness and headaches.    Objective  Physical Exam Vitals and nursing note reviewed.  Constitutional:      General: She is not in acute distress.    Appearance: Normal appearance. She is well-developed and well-groomed. She is not ill-appearing, toxic-appearing or diaphoretic.  Cardiovascular:     Rate and Rhythm: Normal rate and regular rhythm.     Heart sounds: Normal heart sounds, S1 normal and S2 normal.     No friction rub. No gallop. No S3 or S4 sounds.     Comments: PVC noted on EKG. EKG compared to prior EKGs essentially unchanged as PVCs noted on those as well without increased frequency Pulmonary:     Effort: Pulmonary effort is normal. No tachypnea, accessory muscle usage or respiratory distress.     Breath sounds: Normal breath sounds. No stridor, decreased air movement or transmitted upper airway sounds. No decreased breath sounds, wheezing, rhonchi or rales.  Chest:     Chest wall: No tenderness.  Abdominal:     General: Abdomen is flat. Bowel sounds are normal.     Palpations: Abdomen is soft. There is no hepatomegaly, splenomegaly, mass or pulsatile mass.     Tenderness: There is no abdominal tenderness.     Hernia: No hernia is present.  Skin:    General: Skin is warm and dry.     Capillary Refill: Capillary refill takes less than 2 seconds.     Coloration: Skin is not cyanotic or pale.  Neurological:     General: No focal deficit present.     Mental Status: She is alert and oriented to person, place, and time. Mental status is at baseline.     Cranial Nerves: No  dysarthria or facial asymmetry.     Motor: No weakness, tremor, abnormal muscle tone or seizure activity.     Comments: Grip 5/5 bilaterally Shoulder shrug 5/5 bilaterally    BP 132/75   Pulse 75   Temp 98.4 F (36.9 C) (Oral)   Resp 18   Wt 97.5 kg (215 lb)   LMP  (LMP Unknown)   SpO2 100%   BMI 38.09 kg/m   PERC-neg  WELLS-low risk  EKG: unc Lab Results (last 24 hours)     Procedure Component Value Ref Range Date/Time   Troponin, High Sensitive [[id number]]  (Normal) Collected: 07/14/24 0858   Lab Status: Final result Specimen: Blood from Venous Updated: 07/14/24 1109    Troponin, High Sensitive <4 <15 ng/L     Comment: >= 15 ng/L INDICATES MYOCARDIAL DAMAGE.THE DIAGNOSIS OF MYOCARDIAL INFARCTION REQUIRES CLINICAL CORRELATION.   Elevated troponin may also be due to myocardial stress from a variety of causes.   Alkaline Phos (ALP) levels >400 U/L may cause falsely elevated results. Troponin test is invalid in patients taking asfotase alpha.   This troponin assay was not validated for evaluation of troponin in patients younger than 21 years. There are no ranges established for patients younger than 21 years. Therefore,  laboratory results for these patients should be  interpreted with caution.       D-Dimer, Quantitative [[id number]]  (Abnormal) Collected: 07/14/24 0858   Lab Status: Final result Specimen: Blood from Venous Updated: 07/14/24 1104    D-Dimer 1,710.00* 190.00 - 500.00 NG/ML FEU     Comment: FDA APPROVED CUTOFF FOR VTE (to include DVT and PE) EXCLUSION BY THIS METHOD IS <500 NG/ML FEU     TSH With Reflex To Free T4 [[id number]]  (Normal) Collected: 07/14/24 0858   Lab Status: Final result Specimen: Blood from Venous Updated: 07/14/24 1122    TSH 1.961 0.450 - 5.330 uIU/mL    Narrative:     Female Patients >12 years only:  Pregnant Females, 1st Trimester: 0.050 - 3.700 IU/mL  Pregnant Females, 2nd Trimester: 0.310 - 4.350 IU/mL  Pregnant Females, 3rd  Trimester: 0.410 - 5.180 IU/mL   Comprehensive Metabolic Panel [[id number]] Collected: 07/14/24 0858   Lab Status: Final result Specimen: Blood from Venous Updated: 07/14/24 1108    Sodium 138 136 - 145 mmol/L     Potassium 4.6 3.5 - 5.1 mmol/L     Comment: NO VISIBLE HEMOLYSIS      Chloride 104 98 - 107 mmol/L     CO2 25 21 - 31 mmol/L     Comment: High lactate dehydrogenase (LDH) concentrations in patient samples may cause falsely increased bicarbonate results. If markedly elevated LDH levels are suspected, please assess results in conjunction with patient's LDH values.      Anion Gap 9 6 - 14 mmol/L     Glucose, Random 93 70 - 99 mg/dL     Blood Urea Nitrogen (BUN) 15 7 - 25 mg/dL     Creatinine 9.32 9.39 - 1.20 mg/dL     eGFR >09 >40 fO/fpw/8.26f7     Comment: GFR estimated by CKD-EPI equations(NKF 2021).   Recommend confirmation of Cr-based eGFR by using Cys-based eGFR and other filtration markers (if applicable) in complex cases and clinical decision-making, as needed.      Albumin 4.5 3.5 - 5.7 g/dL     Total Protein 7.3 6.4 - 8.9 g/dL     Bilirubin, Total 0.7 0.3 - 1.0 mg/dL     Alkaline Phosphatase (ALP) 57 34 - 104 U/L     Aspartate Aminotransferase (AST) 15 13 - 39 U/L     Alanine Aminotransferase (ALT) 17 7 - 52 U/L     Calcium 9.4 8.6 - 10.3 mg/dL     BUN/Creatinine Ratio --    Comment: Creatinine is normal, ratio is not clinically indicated.      Anemia Profile [[id number]]  (Normal) Collected: 07/14/24 0858   Lab Status: Final result Specimen: Blood from Venous Updated: 07/14/24 1122    Iron 176 50 - 212 ug/dL     Transferrin 720 796 - 362 mg/dL     Ferritin 15 11 - 692 ng/mL     Total Iron Binding Capacity (TIBC) 399 290 - 518 ug/dL     Transferrin Saturation 44 15 - 45 %       CBC was initially ordered but canceled upon presentation to the lab due to specimen being clotted  Assessment/Plan  Diagnoses and all orders for this visit:  Chest pain,  unspecified type -     ECG 12 lead -     lidocaine  (XYLOCAINE ) 2 % viscous solution 15 mL -     alum-mag hydroxide-simethicone  (MAALOX, MYLANTA) 200-200-20 mg/5 mL suspension 30 mL -  Troponin, High Sensitive -     D-Dimer, Quantitative -     TSH With Reflex To Free T4 -     Comprehensive Metabolic Panel -     Anemia Profile -     XR Chest 2 Views -     CT Angio Chest Pulmonary Embolism; Future   Workup today will include EKG, chest x-ray, TSH, CBC, CMP, D-dimer,, troponin and iron panel All lab work done and stat  GI cocktail administered with slight improvement after 10 minutes after 30 minutes significant improvement is noted by pt.  Patient is without gallbladder so gallbladder disease or complications less likely  Patient has known GERD but reports compliance with medications and feels that it is controlled  History of mild vitamin D deficiency currently on supplement  Mild anemia currently on iron supplementation  No history of pancreatitis or risk factors for pancreatitis  No significant risk factors for cardiac disease as patient does not have hypertension, family history of hypertension, however she does have mild elevation in triglycerides and mild elevations in LDL not requiring treatment at this time with pharmacology  With regards to risk factors for DVT or PE overall lack of exercise, weight and prolonged car travel daily however PERC and Wells are negative to low risk  Chest x-ray essentially unremarkable with regards to heart size calcification of the aorta, however small little hernia noted.  This was a condition previously unknown to patient  Review of chart shows patient is on Dupixent.  States that she was on that for several months skin condition cleared but has returned and restarted the Dupixent approximately 2 months ago.  The chest pain is a listed rare side effect patient did not have chest pain issues when on it at the beginning of this year  Reviewed  in person with the patient all lab results including abnormal D-dimer.  Due to abnormal D-dimer at a level 3 times normal range I have set patient up for a stat CTA to rule out PE  Referral has been made and appointment made for 2 PM today at our [facility] outpatient imaging center in [place]  Electronically signed: Neville Murray [place], PA-C 07/14/2024  11:48 AM   *Some images could not be shown."

## 2024-07-14 NOTE — ED Triage Notes (Addendum)
 Pt comes with c/o elevated dimer. Pt went to UC and had bloodwork done due to sob and cp. Pt has all normal labs except dimer. It was elevated. Pt was sent to [facility] for the CT scan but they couldn't get  an IV started. Pt sent here for imaging and IV>  Pt had all labs performed earlier and have resulted.

## 2024-07-14 NOTE — ED Provider Notes (Signed)
 "  [facility] Provider Note    Event Date/Time   First MD Initiated Contact with Patient 07/14/24 1949     (approximate)   History   elevated dimer   HPI  Kathryn Lozano is a 39 y.o. female  with a past medical history of HTN, migraines, anxiety, GERD, PVCs presents to the emergency department with sudden onset substernal chest pain and difficulty breathing when taking a deep breath that started last night. This pain has been persistent.  She did have some numbness radiating down her left arm into her hand but this has resolved at this time.  She reports she was seen at the [facility] urgent care facility prior to arrival, had a complete workup and elevated D-dimer and was then referred to the ER for CT scan to assess for pulmonary embolism. Denies dizziness, fever, cough, calf swelling or leg pain, abdominal pain, vomiting, fall or injury.  She uses a Mirena  IUD, no OCP usage, abnormal bleeding.  She reports she drives total of 3 hours/day commuting to work and back home.  No recent travel in airplane.  No recent surgery.  She does not smoke cigarettes.  No history of cancer or blood clotting disorder. No hx of DVT or PE.  She reports she does drink a lot of caffeine daily. No hx of asthma.  Physical Exam   Triage Vital Signs: ED Triage Vitals  Encounter Vitals Group     BP 07/14/24 1845 (!) 122/96     Girls Systolic BP Percentile --      Girls Diastolic BP Percentile --      Boys Systolic BP Percentile --      Boys Diastolic BP Percentile --      Pulse Rate 07/14/24 1845 86     Resp 07/14/24 1845 18     Temp 07/14/24 1845 98 F (36.7 C)     Temp src --      SpO2 07/14/24 1845 100 %     Weight 07/14/24 1844 215 lb (97.5 kg)     Height 07/14/24 1844 5' 3 (1.6 m)     Head Circumference --      Peak Flow --      Pain Score 07/14/24 1844 6     Pain Loc --      Pain Education --      Exclude from Growth Chart --     Most recent vital  signs: Vitals:   07/14/24 1845 07/14/24 2251  BP: (!) 122/96 116/82  Pulse: 86 76  Resp: 18 16  Temp: 98 F (36.7 C) 97.9 F (36.6 C)  SpO2: 100% 98%    General: Awake, in no acute distress. Appears stated age. Speaking in full sentences. CV: Good peripheral perfusion. RRR, 86 bpm. Respiratory:Normal respiratory effort.  No respiratory distress. CTAB. GI: Soft, non-distended, non-tender.  MSK: Normal ROM and  5/5 strength in b/l upper extremities.  Skin:Warm, dry, intact.  No cyanosis or pallor. No asymmetrical calf swelling. Neurological: A&Ox4 to person, place, time, and situation.   ED Results / Procedures / Treatments   Labs (all labs ordered are listed, but only abnormal results are displayed) Labs Reviewed  CBC - Abnormal; Notable for the following components:      Result Value   MCH 25.7 (*)    All other components within normal limits  POC URINE PREG, ED     EKG NSR Rate: 76 bpm Rhythm: Regular P Waves: present before every  QRS PR Interval: 150 ms QRS Complex: 84 ms ST Segment: isometric, no elevation or depression QTc: 427 ms T Waves: upright, symmetric No evidence of acute ischemic changes.  RADIOLOGY CT Angio Chest FINDINGS:   PULMONARY ARTERIES: Pulmonary arteries are adequately opacified for evaluation. No pulmonary embolism. Main pulmonary artery is normal in caliber.   MEDIASTINUM: The heart and pericardium demonstrate no acute abnormality. There is no acute abnormality of the thoracic aorta.   LYMPH NODES: No mediastinal, hilar or axillary lymphadenopathy.   LUNGS AND PLEURA: The lungs are without acute process. No focal consolidation or pulmonary edema. No pleural effusion or pneumothorax.   UPPER ABDOMEN: Large hiatal hernia.   SOFT TISSUES AND BONES: No acute bone or soft tissue abnormality.   IMPRESSION: 1. No pulmonary embolism. 2. Large hiatal hernia. 3. No acute cardiopulmonary disease.   PROCEDURES:  Critical Care  performed: No   Procedures   MEDICATIONS ORDERED IN ED: Medications  iohexol  (OMNIPAQUE ) 350 MG/ML injection 100 mL (100 mLs Intravenous Contrast Given 07/14/24 2108)     IMPRESSION / MDM / ASSESSMENT AND PLAN / ED COURSE  I reviewed the triage vital signs and the nursing notes.                              Differential diagnosis includes, but is not limited to, PE, ACS, arrhythmia, GERD, excess caffeine usage  Patient's presentation is most consistent with acute complicated illness / injury requiring diagnostic workup.    Clinical Course as of 07/14/24 2253  Thu Jul 14, 2024  2044 Patient is here with concerns of shortness of breath and chest pain that started yesterday. She is well appearing on exam.  SpO2 100% on room air, respiratory rate of 18 and pulse at 86.  Was sent from [facility] urgent care for PE rule out according to the notes from PA-C Mullane. Already given GI cocktail with some improvement. She has already had several labs completed at the urgent care.  I reviewed them.  Results: Anemia profile unremarkable.  CMP unremarkable.  TSH unremarkable.  Troponin unremarkable, will not order a second troponin given the patient's symptoms have been present for over 24 hours. EKG with sinus rhythm with occasional PVCs. D dimer elevated at 1710 on scale of 190-500 as range of normal at urgent care. Will order CT angio chest. [SD]  2131 CBC unremarkable. UPT negative. [SD]  2132 CT angio chest PE  IMPRESSION: 1. No pulmonary embolism. 2. Large hiatal hernia. 3. No acute cardiopulmonary disease.   [SD]  2251 Ordered EKG in the emergency department that shows normal sinus rhythm with no acute ischemic findings. [SD]  2251 Discussed negative results with the patient.  She does feel comfortable going home.  Remains well-appearing and speaking in full sentences without difficulty. She reports she has an incentive spirometer at home to help her take deep breaths if needed.  She  reports she has and appointment already established with cardiology on January 21 for possible echo.  I will have her follow-up with her primary care provider following today's visit for possible further workup needed regarding elevated D-dimer finding. [SD]    Clinical Course User Index [SD] Sheron Salm, PA-C   The patient may return to the emergency department for any new, worsening, or concerning symptoms. Patient was given the opportunity to ask questions; all questions were answered. Emergency department return precautions were discussed with the patient.  Patient is  in agreement to the treatment plan.  Patient is stable for discharge.   FINAL CLINICAL IMPRESSION(S) / ED DIAGNOSES   Final diagnoses:  Chest pain on breathing     Rx / DC Orders   ED Discharge Orders     None        Note:  This document was prepared using Dragon voice recognition software and may include unintentional dictation errors.     Sheron Salm, PA-C 07/14/24 2254    Jacolyn Pae, MD 07/14/24 2309  "

## 2024-07-14 NOTE — ED Notes (Signed)
 First RN informed of pt needing IV for CTA. This RN looked but did not see anything big enough for 20g IV placement.

## 2024-07-14 NOTE — ED Provider Notes (Signed)
 ED ECG REPORT I, Waylon Cassis, the attending physician, personally viewed and interpreted this ECG.  Date: 07/14/2024 EKG Time: 2200 Rate: 76 Rhythm: normal sinus rhythm QRS Axis: normal Intervals: normal ST/T Wave abnormalities: normal Narrative Interpretation: no evidence of acute ischemia    Cassis Waylon, MD 07/14/24 2210

## 2024-07-14 NOTE — Discharge Instructions (Addendum)
 You have been seen in the Emergency Department (ED) today for chest pain and difficulty breathing.  Your CT scan did not show evidence of a pulmonary embolism (blood clot).  As we have discussed todays test results are normal, but you may require further testing.  Please follow up with your PCP as instructed above in these documents regarding todays emergent visit and your recent symptoms to discuss further management and possible workup.  Please also follow-up with your cardiologist as originally scheduled on January 21.  Continue to take your regular medications.   Return to the Emergency Department (ED) if you experience any further chest pain/pressure/tightness, difficulty breathing, vomiting, or sudden sweating, calf swelling or tightness, or other symptoms that concern you.
